# Patient Record
Sex: Female | Born: 1940 | Race: White | Hispanic: No | Smoking: Never smoker
Health system: Southern US, Community
[De-identification: ages and names within clinical notes are randomized; demographics above are authoritative.]

## PROBLEM LIST (undated history)

## (undated) DIAGNOSIS — K5190 Ulcerative colitis, unspecified, without complications: Secondary | ICD-10-CM

## (undated) DIAGNOSIS — I10 Essential (primary) hypertension: Secondary | ICD-10-CM

## (undated) DIAGNOSIS — Z2239 Carrier of other specified bacterial diseases: Secondary | ICD-10-CM

## (undated) DIAGNOSIS — K274 Chronic or unspecified peptic ulcer, site unspecified, with hemorrhage: Secondary | ICD-10-CM

## (undated) DIAGNOSIS — Z9289 Personal history of other medical treatment: Secondary | ICD-10-CM

## (undated) DIAGNOSIS — K284 Chronic or unspecified gastrojejunal ulcer with hemorrhage: Secondary | ICD-10-CM

## (undated) DIAGNOSIS — M1990 Unspecified osteoarthritis, unspecified site: Secondary | ICD-10-CM

## (undated) DIAGNOSIS — I509 Heart failure, unspecified: Secondary | ICD-10-CM

## (undated) DIAGNOSIS — K259 Gastric ulcer, unspecified as acute or chronic, without hemorrhage or perforation: Secondary | ICD-10-CM

## (undated) DIAGNOSIS — E119 Type 2 diabetes mellitus without complications: Secondary | ICD-10-CM

## (undated) DIAGNOSIS — N189 Chronic kidney disease, unspecified: Secondary | ICD-10-CM

## (undated) DIAGNOSIS — D131 Benign neoplasm of stomach: Secondary | ICD-10-CM

## (undated) HISTORY — DX: Benign neoplasm of stomach: D13.1

## (undated) HISTORY — DX: Chronic or unspecified peptic ulcer, site unspecified, with hemorrhage: K27.4

## (undated) HISTORY — PX: ABDOMINAL HYSTERECTOMY: SHX81

## (undated) HISTORY — PX: EYE SURGERY: SHX253

## (undated) HISTORY — PX: ABSCESS DRAINAGE: SHX1119

## (undated) HISTORY — DX: Chronic or unspecified gastrojejunal ulcer with hemorrhage: K28.4

## (undated) HISTORY — PX: OTHER SURGICAL HISTORY: SHX169

---

## 1968-01-03 HISTORY — PX: PERMANENT ILEOSTOMY: SHX6021

## 1992-01-03 DIAGNOSIS — K259 Gastric ulcer, unspecified as acute or chronic, without hemorrhage or perforation: Secondary | ICD-10-CM

## 1992-01-03 HISTORY — DX: Gastric ulcer, unspecified as acute or chronic, without hemorrhage or perforation: K25.9

## 1995-01-03 HISTORY — PX: BACK SURGERY: SHX140

## 1996-01-03 HISTORY — PX: BLADDER SUSPENSION: SHX72

## 2002-01-02 DIAGNOSIS — Z2239 Carrier of other specified bacterial diseases: Secondary | ICD-10-CM

## 2002-01-02 HISTORY — DX: Carrier of other specified bacterial diseases: Z22.39

## 2002-01-02 HISTORY — PX: HERNIA MESH REMOVAL: SHX1745

## 2002-01-02 HISTORY — PX: OTHER SURGICAL HISTORY: SHX169

## 2011-11-25 DIAGNOSIS — Z9889 Other specified postprocedural states: Secondary | ICD-10-CM | POA: Insufficient documentation

## 2011-11-25 DIAGNOSIS — Z8719 Personal history of other diseases of the digestive system: Secondary | ICD-10-CM | POA: Insufficient documentation

## 2011-11-25 DIAGNOSIS — Z8711 Personal history of peptic ulcer disease: Secondary | ICD-10-CM | POA: Insufficient documentation

## 2014-09-16 DIAGNOSIS — E785 Hyperlipidemia, unspecified: Secondary | ICD-10-CM | POA: Insufficient documentation

## 2014-09-16 DIAGNOSIS — E119 Type 2 diabetes mellitus without complications: Secondary | ICD-10-CM | POA: Insufficient documentation

## 2015-02-17 DIAGNOSIS — I701 Atherosclerosis of renal artery: Secondary | ICD-10-CM | POA: Insufficient documentation

## 2015-03-22 DIAGNOSIS — E782 Mixed hyperlipidemia: Secondary | ICD-10-CM | POA: Insufficient documentation

## 2015-03-22 DIAGNOSIS — I129 Hypertensive chronic kidney disease with stage 1 through stage 4 chronic kidney disease, or unspecified chronic kidney disease: Secondary | ICD-10-CM | POA: Insufficient documentation

## 2015-03-22 DIAGNOSIS — N183 Chronic kidney disease, stage 3 unspecified: Secondary | ICD-10-CM | POA: Insufficient documentation

## 2015-04-26 DIAGNOSIS — N261 Atrophy of kidney (terminal): Secondary | ICD-10-CM | POA: Insufficient documentation

## 2015-06-29 DIAGNOSIS — E1122 Type 2 diabetes mellitus with diabetic chronic kidney disease: Secondary | ICD-10-CM | POA: Insufficient documentation

## 2015-07-08 DIAGNOSIS — N183 Chronic kidney disease, stage 3 unspecified: Secondary | ICD-10-CM | POA: Insufficient documentation

## 2015-08-31 DIAGNOSIS — E876 Hypokalemia: Secondary | ICD-10-CM | POA: Insufficient documentation

## 2016-03-01 DIAGNOSIS — F329 Major depressive disorder, single episode, unspecified: Secondary | ICD-10-CM | POA: Insufficient documentation

## 2016-03-01 DIAGNOSIS — F32A Depression, unspecified: Secondary | ICD-10-CM | POA: Insufficient documentation

## 2016-05-25 DIAGNOSIS — H04123 Dry eye syndrome of bilateral lacrimal glands: Secondary | ICD-10-CM | POA: Insufficient documentation

## 2016-05-25 DIAGNOSIS — H43813 Vitreous degeneration, bilateral: Secondary | ICD-10-CM | POA: Insufficient documentation

## 2016-05-25 DIAGNOSIS — H524 Presbyopia: Secondary | ICD-10-CM

## 2016-05-25 DIAGNOSIS — H53022 Refractive amblyopia, left eye: Secondary | ICD-10-CM | POA: Insufficient documentation

## 2016-05-25 DIAGNOSIS — H5213 Myopia, bilateral: Secondary | ICD-10-CM | POA: Insufficient documentation

## 2016-05-25 DIAGNOSIS — H52203 Unspecified astigmatism, bilateral: Secondary | ICD-10-CM

## 2016-05-25 DIAGNOSIS — E119 Type 2 diabetes mellitus without complications: Secondary | ICD-10-CM | POA: Insufficient documentation

## 2017-03-01 ENCOUNTER — Encounter (INDEPENDENT_AMBULATORY_CARE_PROVIDER_SITE_OTHER): Payer: Self-pay | Admitting: Specialist

## 2017-03-01 ENCOUNTER — Ambulatory Visit (INDEPENDENT_AMBULATORY_CARE_PROVIDER_SITE_OTHER): Payer: Medicare Other

## 2017-03-01 ENCOUNTER — Ambulatory Visit (INDEPENDENT_AMBULATORY_CARE_PROVIDER_SITE_OTHER): Payer: Medicare Other | Admitting: Specialist

## 2017-03-01 VITALS — BP 148/74 | HR 74 | Ht 61.0 in | Wt 140.0 lb

## 2017-03-01 DIAGNOSIS — M25561 Pain in right knee: Secondary | ICD-10-CM

## 2017-03-01 DIAGNOSIS — M170 Bilateral primary osteoarthritis of knee: Secondary | ICD-10-CM | POA: Diagnosis not present

## 2017-03-01 DIAGNOSIS — M25562 Pain in left knee: Secondary | ICD-10-CM

## 2017-03-01 DIAGNOSIS — G8929 Other chronic pain: Secondary | ICD-10-CM | POA: Diagnosis not present

## 2017-03-01 DIAGNOSIS — E119 Type 2 diabetes mellitus without complications: Secondary | ICD-10-CM

## 2017-03-01 MED ORDER — HYDROCODONE-ACETAMINOPHEN 5-325 MG PO TABS: 1.0000 | ORAL_TABLET | Freq: Four times a day (QID) | ORAL | 0 refills | Status: DC | PRN

## 2017-03-01 NOTE — Progress Notes (Signed)
Office Visit Note   Patient: Angelica French           Date of Birth: 04-17-1940           MRN: [id number] Visit Date: 03/01/2017              Requested by: Lauraine Rinne, MD [place], [place] [address] PCP: Thornton Dales I, MD   Assessment & Plan: Visit Diagnoses:  1. Bilateral chronic knee pain   2. Bilateral primary osteoarthritis of knee   3. 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 She has been treated for years with exercise, intra articular injections and use of NSAIDs. Now with GI bleed she is unable to take NSAIDs and intraarticular injections are no longer of benefit. I am prescribing hydrocodone for pain and plan to schedule her for a left total knee replacement to be followed in 6 weeks with a right total knee replacement. Risks of surgery discussed with her and she understands the need to control the diabetes. I expect an excellent outcome  With arthroplasty procedure as she has Done everything short of surgery to treat her arthritis and now is no longer benefiting from conservative treatment.    Plan: Avoid prolong standing and walking. Exercise with the knee as tolerated,stationary bike or pool walking . Use ice to the left knee  30 min on and 15 min off as needed. Take meloxicam for antiinflamatory affect. Kandice Hams, surgery scheduling secretary will call you to arrange for Surgery. Surgery recommended is a left total knee replacement. Risks of surgery include risk of infection 1 in 100-200. Risk of bleeding  Less than 1 5 chance of needing a blood transfusion. Take hydrocodone for pain.  Follow-Up Instructions: Return in about 6 weeks (around 04/12/2017).   Orders:  Orders Placed This Encounter  Procedures  . XR Knee 1-2 Views Right  . XR Knee 1-2 Views Left   No orders of the defined  types were placed in this encounter.     Procedures: No procedures performed   Clinical Data: No additional findings.   Subjective: Chief Complaint  Patient presents with  . Right Knee - Pain  . Left Knee - Pain    77 year old female with increasing bilateral knee pain and stiffness. Pain is present with firsta standing and walking.  She has a history of bilateral knee osteoarthritis, injections with synvisc without help. The cortisone shots in the past have been of help, last injection by a sports medicine MD in [place] was not helpful. No tusing a can, she tried her husband's and her mother's without help.  She has a history of a bleeding ulcer related to NSAIDs use.     Review of Systems  Constitutional: Negative.   HENT: Negative.   Eyes: Negative.   Respiratory: Negative.   Cardiovascular: Negative.   Gastrointestinal: Negative.   Endocrine: Negative.   Genitourinary: Negative.   Musculoskeletal: Negative.   Skin: Negative.   Allergic/Immunologic: Negative.   Neurological: Negative.   Hematological: Negative.   Psychiatric/Behavioral: Negative.      Objective: Vital Signs: BP (!) 148/74   Pulse 74   Ht 5\' 1"  (1.549 m)   Wt 140 lb (63.5 kg)   BMI 26.45 kg/m   Physical Exam  Constitutional: She is oriented to person, place, and time. She appears  well-developed and well-nourished.  HENT:  Head: Normocephalic and atraumatic.  Eyes: EOM are normal. Pupils are equal, round, and reactive to light.  Neck: Normal range of motion. Neck supple.  Pulmonary/Chest: Effort normal and breath sounds normal.  Abdominal: Soft. Bowel sounds are normal.  Neurological: She is alert and oriented to person, place, and time.  Skin: Skin is warm and dry.  Psychiatric: She has a normal mood and affect. Her behavior is normal. Judgment and thought content normal.    Right Knee Exam   Muscle Strength  The patient has normal right knee strength.  Range of Motion  Extension:  -5  abnormal  Flexion:  130 normal   Tests  McMurray:  Medial - negative Lateral - negative Varus: positive  Lachman:  Anterior - negative    Posterior - negative Drawer:  Anterior - negative    Posterior - negative Pivot shift: positive Patellar apprehension: positive   Left Knee Exam   Muscle Strength  The patient has normal left knee strength.  Range of Motion  Extension: 10  Flexion:  120 normal   Tests  Varus: positive  Lachman:  Anterior - negative    Posterior - negative Drawer:  Anterior - negative     Posterior - negative Pivot shift: positive Patellar apprehension: positive      Specialty Comments:  No specialty comments available.  Imaging: No results found.   PMFS History: There are no active problems to display for this patient.  Past Medical History:  Diagnosis Date  . Bleeding ulcer     History reviewed. No pertinent family history.  History reviewed. No pertinent surgical history. Social History   Occupational History  . Not on file  Tobacco Use  . Smoking status: Never Smoker  . Smokeless tobacco: Never Used  Substance and Sexual Activity  . Alcohol use: Not on file  . Drug use: Not on file  . Sexual activity: Not on file

## 2017-03-01 NOTE — Patient Instructions (Signed)
Avoid prolong standing and walking. Exercise with the knee as tolerated,stationary bike or pool walking . Use ice to the left knee  30 min on and 15 min off as needed. Take meloxicam for antiinflamatory affect. Kandice Hams, surgery scheduling secretary will call you to arrange for Surgery. Surgery recommended is a left total knee replacement. Risks of surgery include risk of infection 1 in 100-200. Risk of bleeding  Less than 1 5 chance of needing a blood transfusion. Take hydrocodone for pain.

## 2017-04-04 NOTE — Pre-Procedure Instructions (Signed)
Angelica French  04/04/2017      [facility] - [place], [place] - [name] [place] [name] [place] [address] Phone: [phone] Fax: [phone]    Your procedure is scheduled on Friday April 12.  Report to [facility] Admitting at 10:45 A.M.  Call this number if you have problems the morning of surgery:  [phone]   Remember:  Do not eat food or drink liquids after midnight.  Take these medicines the morning of surgery with A SIP OF WATER:   Carvedilol (coreg) Hydralazine (apresoline) Hydrocodone-acetaminophen (Norco) if needed  DO NOT TAKE Victoza (liraglutide) the day of surgery  DO NOT TAKE Glimepiride (Amaryl) the day of surgery or the night before surgery  7 days prior to surgery STOP taking any Aspirin(unless otherwise instructed by your surgeon), Aleve, Naproxen, Ibuprofen, Motrin, Advil, Goody's, BC's, all herbal medications, fish oil, and all vitamins     How to Manage Your Diabetes Before and After Surgery  Why is it important to control my blood sugar before and after surgery? . Improving blood sugar levels before and after surgery helps healing and can limit problems. . A way of improving blood sugar control is eating a healthy diet by: o  Eating less sugar and carbohydrates o  Increasing activity/exercise o  Talking with your doctor about reaching your blood sugar goals . High blood sugars (greater than 180 mg/dL) can raise your risk of infections and slow your recovery, so you will need to focus on controlling your diabetes during the weeks before surgery. . Make sure that the doctor who takes care of your diabetes knows about your planned surgery including the date and location.  How do I manage my blood sugar before surgery? . Check your blood sugar at least 4 times a day, starting 2 days before surgery, to make sure that the level is not too high or low. o Check your blood  sugar the morning of your surgery when you wake up and every 2 hours until you get to the Short Stay unit. . If your blood sugar is less than 70 mg/dL, you will need to treat for low blood sugar: o Do not take insulin. o Treat a low blood sugar (less than 70 mg/dL) with  cup of clear juice (cranberry or apple), 4 glucose tablets, OR glucose gel. Recheck blood sugar in 15 minutes after treatment (to make sure it is greater than 70 mg/dL). If your blood sugar is not greater than 70 mg/dL on recheck, call [phone] o  for further instructions. . Report your blood sugar to the short stay nurse when you get to Short Stay.  . If you are admitted to the hospital after surgery: o Your blood sugar will be checked by the staff and you will probably be given insulin after surgery (instead of oral diabetes medicines) to make sure you have good blood sugar levels. o The goal for blood sugar control after surgery is 80-180 mg/dL.              Do not wear jewelry, make-up or nail polish.  Do not wear lotions, powders, or perfumes, or deodorant.  Do not shave 48 hours prior to surgery.  Men may shave face and neck.  Do not bring valuables to the hospital.  [facility] is not responsible for any belongings or valuables.  Contacts, dentures or bridgework may not be  worn into surgery.  Leave your suitcase in the car.  After surgery it may be brought to your room.  For patients admitted to the hospital, discharge time will be determined by your treatment team.  Patients discharged the day of surgery will not be allowed to drive home.   Special instructions:    - Preparing For Surgery  Before surgery, you can play an important role. Because skin is not sterile, your skin needs to be as free of germs as possible. You can reduce the number of germs on your skin by washing with CHG (chlorahexidine gluconate) Soap before surgery.  CHG is an antiseptic cleaner which kills germs and bonds  with the skin to continue killing germs even after washing.  Please do not use if you have an allergy to CHG or antibacterial soaps. If your skin becomes reddened/irritated stop using the CHG.  Do not shave (including legs and underarms) for at least 48 hours prior to first CHG shower. It is OK to shave your face.  Please follow these instructions carefully.   1. Shower the NIGHT BEFORE SURGERY and the MORNING OF SURGERY with CHG.   2. If you chose to wash your hair, wash your hair first as usual with your normal shampoo.  3. After you shampoo, rinse your hair and body thoroughly to remove the shampoo.  4. Use CHG as you would any other liquid soap. You can apply CHG directly to the skin and wash gently with a scrungie or a clean washcloth.   5. Apply the CHG Soap to your body ONLY FROM THE NECK DOWN.  Do not use on open wounds or open sores. Avoid contact with your eyes, ears, mouth and genitals (private parts). Wash Face and genitals (private parts)  with your normal soap.  6. Wash thoroughly, paying special attention to the area where your surgery will be performed.  7. Thoroughly rinse your body with warm water from the neck down.  8. DO NOT shower/wash with your normal soap after using and rinsing off the CHG Soap.  9. Pat yourself dry with a CLEAN TOWEL.  10. Wear CLEAN PAJAMAS to bed the night before surgery, wear comfortable clothes the morning of surgery  11. Place CLEAN SHEETS on your bed the night of your first shower and DO NOT SLEEP WITH PETS.    Day of Surgery: Do not apply any deodorants/lotions. Please wear clean clothes to the hospital/surgery center.      Please read over the following fact sheets that you were given. Coughing and Deep Breathing, Total Joint Packet, MRSA Information and Surgical Site Infection Prevention

## 2017-04-05 ENCOUNTER — Ambulatory Visit (INDEPENDENT_AMBULATORY_CARE_PROVIDER_SITE_OTHER): Payer: Medicare Other | Admitting: Surgery

## 2017-04-05 ENCOUNTER — Encounter (INDEPENDENT_AMBULATORY_CARE_PROVIDER_SITE_OTHER): Payer: Self-pay | Admitting: Surgery

## 2017-04-05 ENCOUNTER — Encounter (HOSPITAL_COMMUNITY): Payer: Self-pay

## 2017-04-05 ENCOUNTER — Other Ambulatory Visit (INDEPENDENT_AMBULATORY_CARE_PROVIDER_SITE_OTHER): Payer: Self-pay

## 2017-04-05 ENCOUNTER — Ambulatory Visit (HOSPITAL_COMMUNITY)
Admission: RE | Admit: 2017-04-05 | Discharge: 2017-04-05 | Disposition: A | Discharge status: Home / Self Care | Payer: Medicare Other | Source: Ambulatory Visit | Attending: Surgery | Admitting: Surgery

## 2017-04-05 ENCOUNTER — Encounter (HOSPITAL_COMMUNITY)
Admission: RE | Admit: 2017-04-05 | Discharge: 2017-04-05 | Disposition: A | Discharge status: Home / Self Care | Payer: Medicare Other | Source: Ambulatory Visit | Attending: Specialist | Admitting: Specialist

## 2017-04-05 ENCOUNTER — Other Ambulatory Visit: Payer: Self-pay

## 2017-04-05 VITALS — BP 130/70 | HR 73 | Ht 61.0 in | Wt 136.0 lb

## 2017-04-05 DIAGNOSIS — M1712 Unilateral primary osteoarthritis, left knee: Secondary | ICD-10-CM | POA: Diagnosis not present

## 2017-04-05 DIAGNOSIS — I130 Hypertensive heart and chronic kidney disease with heart failure and stage 1 through stage 4 chronic kidney disease, or unspecified chronic kidney disease: Secondary | ICD-10-CM | POA: Diagnosis not present

## 2017-04-05 DIAGNOSIS — Z01818 Encounter for other preprocedural examination: Secondary | ICD-10-CM | POA: Insufficient documentation

## 2017-04-05 DIAGNOSIS — M1711 Unilateral primary osteoarthritis, right knee: Secondary | ICD-10-CM | POA: Insufficient documentation

## 2017-04-05 DIAGNOSIS — E1122 Type 2 diabetes mellitus with diabetic chronic kidney disease: Secondary | ICD-10-CM | POA: Diagnosis not present

## 2017-04-05 DIAGNOSIS — N189 Chronic kidney disease, unspecified: Secondary | ICD-10-CM | POA: Diagnosis not present

## 2017-04-05 DIAGNOSIS — I509 Heart failure, unspecified: Secondary | ICD-10-CM | POA: Diagnosis not present

## 2017-04-05 DIAGNOSIS — Z79899 Other long term (current) drug therapy: Secondary | ICD-10-CM | POA: Insufficient documentation

## 2017-04-05 HISTORY — DX: Ulcerative colitis, unspecified, without complications: K51.90

## 2017-04-05 HISTORY — DX: Personal history of other medical treatment: Z92.89

## 2017-04-05 HISTORY — DX: Gastric ulcer, unspecified as acute or chronic, without hemorrhage or perforation: K25.9

## 2017-04-05 HISTORY — DX: Essential (primary) hypertension: I10

## 2017-04-05 HISTORY — DX: Type 2 diabetes mellitus without complications: E11.9

## 2017-04-05 HISTORY — DX: Chronic kidney disease, unspecified: N18.9

## 2017-04-05 HISTORY — DX: Carrier of other specified bacterial diseases: Z22.39

## 2017-04-05 HISTORY — DX: Unspecified osteoarthritis, unspecified site: M19.90

## 2017-04-05 HISTORY — DX: Heart failure, unspecified: I50.9

## 2017-04-05 LAB — COMPREHENSIVE METABOLIC PANEL
ALT: 15 U/L (ref 14–54)
AST: 23 U/L (ref 15–41)
Albumin: 3.9 g/dL (ref 3.5–5.0)
Alkaline Phosphatase: 68 U/L (ref 38–126)
Anion gap: 14 (ref 5–15)
BUN: 23 mg/dL — ABNORMAL HIGH (ref 6–20)
CHLORIDE: 105 mmol/L (ref 101–111)
CO2: 18 mmol/L — AB (ref 22–32)
Calcium: 9.6 mg/dL (ref 8.9–10.3)
Creatinine, Ser: 1.55 mg/dL — ABNORMAL HIGH (ref 0.44–1.00)
GFR, EST AFRICAN AMERICAN: 36 mL/min — AB (ref >60)
GFR, EST NON AFRICAN AMERICAN: 31 mL/min — AB (ref >60)
Glucose, Bld: 165 mg/dL — ABNORMAL HIGH (ref 65–99)
POTASSIUM: 4.2 mmol/L (ref 3.5–5.1)
SODIUM: 137 mmol/L (ref 135–145)
Total Bilirubin: 0.9 mg/dL (ref 0.3–1.2)
Total Protein: 7.6 g/dL (ref 6.5–8.1)

## 2017-04-05 LAB — CBC
HCT: 39.7 % (ref 36.0–46.0)
Hemoglobin: 13.1 g/dL (ref 12.0–15.0)
MCH: 26.7 pg (ref 26.0–34.0)
MCHC: 33 g/dL (ref 30.0–36.0)
MCV: 80.9 fL (ref 78.0–100.0)
PLATELETS: 185 10*3/uL (ref 150–400)
RBC: 4.91 MIL/uL (ref 3.87–5.11)
RDW: 15.5 % (ref 11.5–15.5)
WBC: 10.2 10*3/uL (ref 4.0–10.5)

## 2017-04-05 LAB — HEMOGLOBIN A1C
Hgb A1c MFr Bld: 7.3 % — ABNORMAL HIGH (ref 4.8–5.6)
MEAN PLASMA GLUCOSE: 162.81 mg/dL

## 2017-04-05 LAB — PROTIME-INR
INR: 1.05
PROTHROMBIN TIME: 13.6 s (ref 11.4–15.2)

## 2017-04-05 LAB — SURGICAL PCR SCREEN
MRSA, PCR: NEGATIVE
STAPHYLOCOCCUS AUREUS: NEGATIVE

## 2017-04-05 LAB — APTT: aPTT: 39 seconds — ABNORMAL HIGH (ref 24–36)

## 2017-04-05 LAB — GLUCOSE, CAPILLARY: Glucose-Capillary: 233 mg/dL — ABNORMAL HIGH (ref 65–99)

## 2017-04-05 NOTE — H&P (Addendum)
TOTAL KNEE ADMISSION H&P  Patient is being admitted for left total knee arthroplasty.  And right knee intra-articular Marcaine/Depo-Medrol injection  Subjective:  Chief Complaint:left knee pain and right knee pain  HPI: Angelica French, 77 y.o. female, has a history of pain and functional disability in the left knee due to arthritis and has failed non-surgical conservative treatments for greater than 12 weeks to includeNSAID's and/or analgesics, corticosteriod injections, use of assistive devices, weight reduction as appropriate and activity modification.  Onset of symptoms was gradual, starting 10 years ago with gradually worsening course since that time.  Patient currently rates pain in the left knee(s) at 10 out of 10 with activity. Patient has night pain, worsening of pain with activity and weight bearing, pain that interferes with activities of daily living, pain with passive range of motion, crepitus and joint swelling.  Patient has evidence of subchondral sclerosis, periarticular osteophytes and joint space narrowing by imaging studies.  There is no active infection.  There are no active problems to display for this patient.  Past Medical History:  Diagnosis Date  . Arthritis   . Bleeding ulcer   . CHF (congestive heart failure) ([place])   . Chronic kidney disease    Renal artery stenosis - right. Left kindey ok- sees Dr Neta Ehlers- nepjrologist  . Diabetes mellitus without complication ([place])    type II  . Gastric ulcer 1994   bleeding  . History of blood transfusion    1970- illeostomy 2004-  . Hypertension   . UC (ulcerative colitis) [facility])     Past Surgical History:  Procedure Laterality Date  . ABDOMINAL HYSTERECTOMY      No current facility-administered medications for this encounter.    Current Outpatient Medications  Medication Sig Dispense Refill Last Dose  . atorvastatin (LIPITOR) 20 MG tablet Take 20 mg by mouth. 3 times a week at bedtime   Taking  . Biotin 2500 MCG CAPS  Take 2,500 mcg by mouth daily.    Taking  . carvedilol (COREG) 25 MG tablet Take 25 mg by mouth Nightly.   Taking  . cholecalciferol (VITAMIN D) 1000 units tablet Take 1,000 Units by mouth every other day.    Taking  . clindamycin (CLEOCIN) 2 % vaginal cream Place 1 Applicatorful vaginally daily as needed (irritation).    Taking  . Desoximetasone 0.05 % GEL Apply 1 application topically daily as needed (irritation).   Taking  . furosemide (LASIX) 40 MG tablet Take 40 mg by mouth once daily as needed for swelling   Taking  . glimepiride (AMARYL) 4 MG tablet Take 4 mg by mouth twice daily   Taking  . hydrALAZINE (APRESOLINE) 50 MG tablet Take 50 mg by mouth twice daily   Taking  . hydrochlorothiazide (HYDRODIURIL) 25 MG tablet Take 25 mg by mouth daily.  1 Taking  . HYDROcodone-acetaminophen (NORCO/VICODIN) 5-325 MG tablet Take 1 tablet by mouth every 6 (six) hours as needed for moderate pain. 40 tablet 0 Taking  . liraglutide (VICTOZA) 18 MG/3ML SOPN Inject 0.6 mg once daily   Taking  . losartan (COZAAR) 50 MG tablet Take 50 mg by mouth at bedtime.    Taking  . naproxen sodium (ALEVE) 220 MG tablet Take 220 mg by mouth daily as needed (pain).   Taking  . potassium bicarbonate (K-LYTE) 25 MEQ disintegrating tablet Take 25 mEq by mouth daily as needed (swelling). With lasix   Taking   Allergies  Allergen Reactions  . Adhesive [Tape] Hives and  Itching  . Nsaids     Gi pain after prolonged use     Social History   Tobacco Use  . Smoking status: Never Smoker  . Smokeless tobacco: Never Used  Substance Use Topics  . Alcohol use: Not on file    Comment: occasional drink with liqueor    Family History  Problem Relation Age of Onset  . Heart disease Father      Review of Systems  Constitutional: Negative.   HENT: Negative.   Respiratory: Negative.   Gastrointestinal: Negative.   Musculoskeletal: Positive for joint pain.  Skin: Negative.   Neurological: Negative.    Psychiatric/Behavioral: Negative.     Objective:  Physical Exam  Constitutional: She is oriented to person, place, and time. She appears well-developed.  HENT:  Head: Normocephalic and atraumatic.  Eyes: Pupils are equal, round, and reactive to light. EOM are normal.  Neck: Normal range of motion.  Cardiovascular: Regular rhythm.  Respiratory: Effort normal. No respiratory distress. She has no wheezes.  GI: She exhibits no distension.  Musculoskeletal:  Patient has left greater than right knee joint line tenderness.  Positive crepitus.  Ligaments are stable.  Bilateral knee Effusions.  Bilateral calves are nontender  Neurological: She is alert and oriented to person, place, and time.  Skin: Skin is warm and dry.  Psychiatric: She has a normal mood and affect.    Vital signs in last 24 hours: Temp:  [97.7 F (36.5 C)] 97.7 F (36.5 C) (04/04 1125) Pulse Rate:  [71-73] 73 (04/04 1358) Resp:  [20] 20 (04/04 1125) BP: (94-130)/(59-70) 130/70 (04/04 1358) SpO2:  [99 %] 99 % (04/04 1125) Weight:  [136 lb (61.7 kg)-136 lb 6.4 oz (61.9 kg)] 136 lb (61.7 kg) (04/04 1358)  Labs:   Estimated body mass index is 25.7 kg/m as calculated from the following:   Height as of 04/05/17: 5\' 1"  (1.549 m).   Weight as of 04/05/17: 136 lb (61.7 kg).   Imaging Review Plain radiographs demonstrate moderate degenerative joint disease of the left knee(s). The overall alignment ismild varus. The bone quality appears to be good for age and reported activity level.   Preoperative templating of the joint replacement has been completed, documented, and submitted to the Operating Room personnel in order to optimize intra-operative equipment management.      Assessment/Plan:  End stage arthritis, left knee  Right knee arthritis  We will proceed with left total knee replacement as scheduled along with right knee intra-articular Marcaine/Depo-Medrol injection.  The patient history, physical  examination, clinical judgment of the provider and imaging studies are consistent with end stage degenerative joint disease of the left knee(s) and total knee arthroplasty is deemed medically necessary. The treatment options including medical management, injection therapy arthroscopy and arthroplasty were discussed at length. The risks and benefits of total knee arthroplasty were presented and reviewed. The risks due to aseptic loosening, infection, stiffness, patella tracking problems, thromboembolic complications and other imponderables were discussed. The patient acknowledged the explanation, agreed to proceed with the plan and consent was signed. Patient is being admitted for inpatient treatment for surgery, pain control, PT, OT, prophylactic antibiotics, VTE prophylaxis, progressive ambulation and ADL's and discharge planning. The patient is planning to be discharged to skilled nursing facility

## 2017-04-05 NOTE — Pre-Procedure Instructions (Signed)
Angelica French  04/05/2017      Your procedure is scheduled on Friday April 12.  Report to [facility] Admitting at 10:45 A.M.               Your surgery or procedure is scheduled for 12:45 PM   Call this number if you have problems the morning of surgery:[phone] -pre- op desk                For any other questions, please call [phone], Monday - Friday 8 AM - 4 PM.- PAT desk, ask for any nurse    Remember:  Do not eat food or drink liquids after midnight.  Take these medicines the morning of surgery with A SIP OF WATER:   Carvedilol (coreg) Hydralazine (apresoline) Hydrocodone-acetaminophen (Norco) if needed  DO NOT TAKE Victoza (liraglutide) the day of surgery  DO NOT TAKE Glimepiride (Amaryl) the day of surgery or the night before surgery  7 days prior to surgery STOP taking any Aspirin(unless otherwise instructed by your surgeon), Aleve, Naproxen, Ibuprofen, Motrin, Advil, Goody's, BC's, all herbal medications, fish oil, and all vitamins     How to Manage Your Diabetes Before and After Surgery  Why is it important to control my blood sugar before and after surgery? . Improving blood sugar levels before and after surgery helps healing and can limit problems. . A way of improving blood sugar control is eating a healthy diet by: o  Eating less sugar and carbohydrates o  Increasing activity/exercise o  Talking with your doctor about reaching your blood sugar goals . High blood sugars (greater than 180 mg/dL) can raise your risk of infections and slow your recovery, so you will need to focus on controlling your diabetes during the weeks before surgery. . Make sure that the doctor who takes care of your diabetes knows about your planned surgery including the date and location.  How do I manage my blood sugar before surgery? . Check your blood sugar at least 4 times a day, starting 2 days before surgery, to make sure that the level is not too high or  low. o Check your blood sugar the morning of your surgery when you wake up and every 2 hours until you get to the Short Stay unit. . If your blood sugar is less than 70 mg/dL, you will need to treat for low blood sugar: o Do not take insulin. o Treat a low blood sugar (less than 70 mg/dL) with  cup of clear juice (cranberry or apple), 4 glucose tablets, OR glucose gel. Recheck blood sugar in 15 minutes after treatment (to make sure it is greater than 70 mg/dL). If your blood sugar is not greater than 70 mg/dL on recheck, call [phone] o  for further instructions. . Report your blood sugar to the short stay nurse when you get to Short Stay.  . If you are admitted to the hospital after surgery: o Your blood sugar will be checked by the staff and you will probably be given insulin after surgery (instead of oral diabetes medicines) to make sure you have good blood sugar levels. o The goal for blood sugar control after surgery is 80-180 mg/dL.              Do not wear jewelry, make-up or nail polish.  Do not wear lotions, powders, or perfumes, or deodorant.  Do not shave 48 hours prior to surgery.  Men may shave face and  neck.  Do not bring valuables to the hospital.  [facility] is not responsible for any belongings or valuables.  Contacts, dentures or bridgework may not be worn into surgery.  Leave your suitcase in the car.  After surgery it may be brought to your room.  For patients admitted to the hospital, discharge time will be determined by your treatment team.  Patients discharged the day of surgery will not be allowed to drive home.   Special instructions:    [place]- Preparing For Surgery  Before surgery, you can play an important role. Because skin is not sterile, your skin needs to be as free of germs as possible. You can reduce the number of germs on your skin by washing with CHG (chlorahexidine gluconate) Soap before surgery.  CHG is an antiseptic cleaner  which kills germs and bonds with the skin to continue killing germs even after washing.  Please do not use if you have an allergy to CHG or antibacterial soaps. If your skin becomes reddened/irritated stop using the CHG.  Do not shave (including legs and underarms) for at least 48 hours prior to first CHG shower. It is OK to shave your face.  Please follow these instructions carefully.   1. Shower the NIGHT BEFORE SURGERY and the MORNING OF SURGERY with CHG.   2. If you chose to wash your hair, wash your hair first as usual with your normal shampoo.  3. After you shampoo, rinse your hair and body thoroughly to remove the shampoo. 4. Wash Face and genitals (private parts)  with your normal soap, then rinse  5. Use CHG as you would any other liquid soap. You can apply CHG directly to the skin and wash gently with a scrungie or a clean washcloth.   6. Apply the CHG Soap to your body ONLY FROM THE NECK DOWN.  Do not use on open wounds or open sores. Avoid contact with your eyes, ears, mouth and genitals (private parts).   7. Wash thoroughly, paying special attention to the area where your surgery will be performed.  8. Thoroughly rinse your body with warm water from the neck down.  9. DO NOT shower/wash with your normal soap after using and rinsing off the CHG Soap.  10. Pat yourself dry with a CLEAN TOWEL.  11. Wear CLEAN PAJAMAS to bed the night before surgery, wear comfortable clothes the morning of surgery  12. Place CLEAN SHEETS on your bed the night of your first shower and DO NOT SLEEP WITH PETS.  Day of Surgery:  Shower as above Do not apply any deodorants/lotions. Please wear clean clothes to the hospital/surgery center.    Do not wear jewelry, make-up or nail polish.  Do not wear lotions, powders, or perfumes, or deodorant.  Do not shave 48 hours prior to surgery.  Men may shave face and neck.  Do not bring valuables to the hospital.  [facility] is not responsible for any  belongings or valuables.  Contacts, dentures or bridgework may not be worn into surgery.  Leave your suitcase in the car.  After surgery it may be brought to your room.  For patients admitted to the hospital, discharge time will be determined by your treatment team.  Patients discharged the day of surgery will not be allowed to drive home.    Please read over the following fact sheets that you were given. Coughing and Deep Breathing, Total Joint Packet, MRSA Information and Surgical Site Infection Prevention

## 2017-04-05 NOTE — Progress Notes (Signed)
77 year old white female history of end-stage DJD left knee presents for preop evaluation for left total knee replacement and right knee intra-articular Marcaine/Depo-Medrol injection.  We received preoperative cardiac and medical clearance is.  Full H&P placed in patient's chart.  Patient's husband is not in the best of health and we did discuss the likelihood of needing short-term skilled nursing facility placement postop.  She does live out in [facility] and we discussed [facility] facility.

## 2017-04-06 NOTE — Progress Notes (Addendum)
Anesthesia chart review: Patient is a 77 year old female scheduled for left total knee arthroplasty and right knee intra-articular steroid injection on 04/13/2017 by Dr. [name]. Case is posted for general anesthesia.   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   - PCP is Dr. Thornton Dales. She signed a note of medical clearance on 03/28/17 with recommendation to repeat urine culture following antibiotic (Cipro ?) completion.  - [facility] Last visit 03/21/17. He cleared patient after recent low risk Lexiscan stress test.  - [facility] Last visit 04/06/17. He prescribed azithromycin for persistent URI symptoms. She apparently could not provide a urine specimen while at PAT and reportedly has some residual dysuria, so he ordered a UA and urine culture. Notes indicate that she does have some insulin at home that she takes for a few days after any steroid injections.  - [facility] Last visit 01/16/17.  Meds include Lipitor, Coreg, Lasix, glimepiride, hydralazine, HCTZ, Norco, Victoza, losartan, K-lyte. Reportedly, patient just finished or is going to finish Cipro within a few days of her PAT visit. She uses [employer] ileostomy flange [phone]) and pouch [phone]).  BP (!) 94/59   Pulse 71   Temp 36.5 C   Resp 20   Ht '5\' 1"'  (1.549 m)   Wt 136 lb 6.4 oz (61.9 kg)   SpO2 99%   BMI 25.77 kg/m  BP 130/70 on 04/05/17.   EKG 03/21/17 (Dr. Donnetta Hutching): Sinus rhythm, frequent PVCs (primarily in a trigeminy pattern).  Nonspecific ST/T wave changes in lateral leads.  Nuclear stress test  03/27/17: Summary There is normal isotope uptake following Lexiscan injection and at rest. There is no evidence of ischemia. Normal LV function with EF of 68 %.  Echo 06/15/14: Report not viewable in [place]. According to Dr. Deatra Robinson 09/16/14 note, "I have reviewed the echo and she has normal LV function with no significant valve disease."  Carotid U/S 05/31/15: Report requested as it is not viewable in [place].  Renal U/S 02/01/17 ([place]): IMPRESSION:  1. Presumably chronic atrophy of the right kidney with nondiagnostic  evaluation of the arterial flow to the right kidney.  2. Normal sonographic appearance of the left kidney without evidence  of renal artery stenosis.    Preoperative labs noted. BUN 23, Cr 1.55. (Previous BUN/Cr 25/1.22 03/20/17, 24/1.06 01/09/17, 28/1.14 10/06/16 in [place].)  A1c 7.3. CBC WNL. PT/INR WNL. PTT 39.   Will leave chart for follow-up echo, carotid reports and urine culture results. Will plan to forward labs to her nephrologist since Cr up since her 01/2017 visit. eGFR remains in the CKD stage III range (3B).   George Hugh [facility] Short Stay Center/Anesthesiology Phone [phone] 04/06/2017 6:33 PM  Addendum: 04/06/17 urine culture showed "no growth" (see [place]). I notified Apolonio Schneiders at Dr. Dyann Kief office of BUN, Cr, eGFR from 04/05/17 and that patient was just finishing up antibiotics at that time. He is in the office this week. She will ask him or his staff to call if any new recommendations prior to surgery. If no new recommendations, I will plan to repeat STAT BMET prior to surgery to re-evaluate.  Still awaking echo and carotid reports. (Update 04/12/17 5:35 PM: Despite multiple phone call and at least three fax requests for echo and carotid Duplex results, records were still not sent. Dr. Donnetta Hutching at least summarized the echo results in his note. I do not see carotid results summarized in review of  multiple notes, but there is also no indication of carotid artery stenosis history in PCP and cardiology records, and they would have had access to results. There is no history of CVA. Based on currently available information, I would anticipate that she can proceed as planned. Her PCP and cardiologist have cleared for surgery.)   George Hugh [facility] Short Stay Center/Anesthesiology Phone [phone] 04/09/2017 9:50 AM

## 2017-04-12 ENCOUNTER — Ambulatory Visit (INDEPENDENT_AMBULATORY_CARE_PROVIDER_SITE_OTHER): Payer: Medicare Other | Admitting: Specialist

## 2017-04-13 ENCOUNTER — Ambulatory Visit (HOSPITAL_COMMUNITY): Payer: Medicare Other | Admitting: Anesthesiology

## 2017-04-13 ENCOUNTER — Encounter (HOSPITAL_COMMUNITY)
Admission: RE | Disposition: A | Discharge status: Skilled Nursing Facility | Payer: Self-pay | Source: Ambulatory Visit | Attending: Specialist

## 2017-04-13 ENCOUNTER — Ambulatory Visit (HOSPITAL_COMMUNITY): Payer: Medicare Other | Admitting: Vascular Surgery

## 2017-04-13 ENCOUNTER — Other Ambulatory Visit: Payer: Self-pay

## 2017-04-13 ENCOUNTER — Inpatient Hospital Stay (HOSPITAL_COMMUNITY)
Admission: RE | Admit: 2017-04-13 | Discharge: 2017-04-17 | DRG: 470 | Disposition: A | Discharge status: Skilled Nursing Facility | Payer: Medicare Other | Source: Ambulatory Visit | Attending: Specialist | Admitting: Specialist

## 2017-04-13 ENCOUNTER — Encounter (HOSPITAL_COMMUNITY): Payer: Self-pay | Admitting: Surgery

## 2017-04-13 DIAGNOSIS — Z90710 Acquired absence of both cervix and uterus: Secondary | ICD-10-CM

## 2017-04-13 DIAGNOSIS — N183 Chronic kidney disease, stage 3 (moderate): Secondary | ICD-10-CM | POA: Diagnosis present

## 2017-04-13 DIAGNOSIS — Z8249 Family history of ischemic heart disease and other diseases of the circulatory system: Secondary | ICD-10-CM

## 2017-04-13 DIAGNOSIS — R05 Cough: Secondary | ICD-10-CM

## 2017-04-13 DIAGNOSIS — M1712 Unilateral primary osteoarthritis, left knee: Secondary | ICD-10-CM

## 2017-04-13 DIAGNOSIS — Z96652 Presence of left artificial knee joint: Secondary | ICD-10-CM

## 2017-04-13 DIAGNOSIS — Z79899 Other long term (current) drug therapy: Secondary | ICD-10-CM | POA: Diagnosis not present

## 2017-04-13 DIAGNOSIS — Z888 Allergy status to other drugs, medicaments and biological substances status: Secondary | ICD-10-CM

## 2017-04-13 DIAGNOSIS — Z8711 Personal history of peptic ulcer disease: Secondary | ICD-10-CM | POA: Diagnosis not present

## 2017-04-13 DIAGNOSIS — I701 Atherosclerosis of renal artery: Secondary | ICD-10-CM | POA: Diagnosis not present

## 2017-04-13 DIAGNOSIS — Z79891 Long term (current) use of opiate analgesic: Secondary | ICD-10-CM

## 2017-04-13 DIAGNOSIS — I130 Hypertensive heart and chronic kidney disease with heart failure and stage 1 through stage 4 chronic kidney disease, or unspecified chronic kidney disease: Secondary | ICD-10-CM | POA: Diagnosis present

## 2017-04-13 DIAGNOSIS — I509 Heart failure, unspecified: Secondary | ICD-10-CM | POA: Diagnosis present

## 2017-04-13 DIAGNOSIS — M21162 Varus deformity, not elsewhere classified, left knee: Secondary | ICD-10-CM | POA: Diagnosis not present

## 2017-04-13 DIAGNOSIS — E1122 Type 2 diabetes mellitus with diabetic chronic kidney disease: Secondary | ICD-10-CM | POA: Diagnosis present

## 2017-04-13 DIAGNOSIS — M170 Bilateral primary osteoarthritis of knee: Principal | ICD-10-CM

## 2017-04-13 DIAGNOSIS — Z791 Long term (current) use of non-steroidal anti-inflammatories (NSAID): Secondary | ICD-10-CM

## 2017-04-13 DIAGNOSIS — J069 Acute upper respiratory infection, unspecified: Secondary | ICD-10-CM | POA: Diagnosis not present

## 2017-04-13 DIAGNOSIS — M25762 Osteophyte, left knee: Secondary | ICD-10-CM | POA: Diagnosis present

## 2017-04-13 DIAGNOSIS — Z7984 Long term (current) use of oral hypoglycemic drugs: Secondary | ICD-10-CM

## 2017-04-13 DIAGNOSIS — R058 Other specified cough: Secondary | ICD-10-CM

## 2017-04-13 DIAGNOSIS — Z91048 Other nonmedicinal substance allergy status: Secondary | ICD-10-CM | POA: Diagnosis not present

## 2017-04-13 HISTORY — PX: TOTAL KNEE ARTHROPLASTY: SHX125

## 2017-04-13 LAB — GLUCOSE, CAPILLARY
GLUCOSE-CAPILLARY: 103 mg/dL — AB (ref 65–99)
Glucose-Capillary: 157 mg/dL — ABNORMAL HIGH (ref 65–99)
Glucose-Capillary: 133 mg/dL — ABNORMAL HIGH (ref 65–99)
Glucose-Capillary: 194 mg/dL — ABNORMAL HIGH (ref 65–99)

## 2017-04-13 LAB — HEMOGLOBIN A1C
HEMOGLOBIN A1C: 7.1 % — AB (ref 4.8–5.6)
Mean Plasma Glucose: 157.07 mg/dL

## 2017-04-13 SURGERY — ARTHROPLASTY, KNEE, TOTAL
Anesthesia: General | Laterality: Left

## 2017-04-13 MED ORDER — BUPIVACAINE LIPOSOME 1.3 % IJ SUSP
20.0000 mL | INTRAMUSCULAR | Status: DC
  Filled 2017-04-13: qty 20

## 2017-04-13 MED ORDER — TRANEXAMIC ACID 1000 MG/10ML IV SOLN
1000.0000 mg | INTRAVENOUS | Status: AC
  Administered 2017-04-13: 1000 mg via INTRAVENOUS
  Filled 2017-04-13: qty 1100

## 2017-04-13 MED ORDER — LACTATED RINGERS IV SOLN
INTRAVENOUS | Status: DC
  Administered 2017-04-13 (×2): via INTRAVENOUS

## 2017-04-13 MED ORDER — INSULIN ASPART 100 UNIT/ML ~~LOC~~ SOLN
0.0000 [IU] | Freq: Three times a day (TID) | SUBCUTANEOUS | Status: DC
  Administered 2017-04-14: 5 [IU] via SUBCUTANEOUS
  Administered 2017-04-14: 7 [IU] via SUBCUTANEOUS
  Administered 2017-04-14: 3 [IU] via SUBCUTANEOUS
  Administered 2017-04-15: 5 [IU] via SUBCUTANEOUS
  Administered 2017-04-15 – 2017-04-16 (×4): 3 [IU] via SUBCUTANEOUS
  Administered 2017-04-16 – 2017-04-17 (×3): 2 [IU] via SUBCUTANEOUS

## 2017-04-13 MED ORDER — PROMETHAZINE HCL 25 MG/ML IJ SOLN: 6.2500 mg | INTRAMUSCULAR | Status: DC | PRN

## 2017-04-13 MED ORDER — PHENYLEPHRINE HCL 10 MG/ML IJ SOLN
INTRAVENOUS | Status: DC | PRN
  Administered 2017-04-13: 25 ug/min via INTRAVENOUS

## 2017-04-13 MED ORDER — VITAMIN D 1000 UNITS PO TABS
1000.0000 [IU] | ORAL_TABLET | ORAL | Status: DC
  Administered 2017-04-14 – 2017-04-16 (×2): 1000 [IU] via ORAL
  Filled 2017-04-13 (×2): qty 1

## 2017-04-13 MED ORDER — MENTHOL 3 MG MT LOZG: 1.0000 | LOZENGE | OROMUCOSAL | Status: DC | PRN

## 2017-04-13 MED ORDER — MORPHINE SULFATE (PF) 2 MG/ML IV SOLN: 0.5000 mg | INTRAVENOUS | Status: DC | PRN

## 2017-04-13 MED ORDER — HYDROCODONE-ACETAMINOPHEN 5-325 MG PO TABS
1.0000 | ORAL_TABLET | ORAL | Status: DC | PRN
  Administered 2017-04-14 – 2017-04-16 (×7): 2 via ORAL
  Filled 2017-04-13 (×7): qty 2

## 2017-04-13 MED ORDER — HYDROMORPHONE HCL 1 MG/ML IJ SOLN
INTRAMUSCULAR | Status: AC
  Administered 2017-04-13: 0.5 mg via INTRAVENOUS
  Filled 2017-04-13: qty 1

## 2017-04-13 MED ORDER — FLUOCINONIDE 0.05 % EX CREA
1.0000 "application " | TOPICAL_CREAM | Freq: Every day | CUTANEOUS | Status: DC | PRN
  Filled 2017-04-13: qty 30

## 2017-04-13 MED ORDER — GABAPENTIN 300 MG PO CAPS
300.0000 mg | ORAL_CAPSULE | Freq: Three times a day (TID) | ORAL | Status: DC
  Administered 2017-04-13 – 2017-04-17 (×11): 300 mg via ORAL
  Filled 2017-04-13 (×11): qty 1

## 2017-04-13 MED ORDER — CHLORHEXIDINE GLUCONATE 4 % EX LIQD: 60.0000 mL | Freq: Once | CUTANEOUS | Status: DC

## 2017-04-13 MED ORDER — FLEET ENEMA 7-19 GM/118ML RE ENEM: 1.0000 | ENEMA | Freq: Once | RECTAL | Status: DC | PRN

## 2017-04-13 MED ORDER — OXYCODONE HCL 5 MG PO TABS
ORAL_TABLET | ORAL | Status: AC
  Administered 2017-04-13: 5 mg via ORAL
  Filled 2017-04-13: qty 1

## 2017-04-13 MED ORDER — LIDOCAINE 2% (20 MG/ML) 5 ML SYRINGE
INTRAMUSCULAR | Status: AC
  Filled 2017-04-13: qty 5

## 2017-04-13 MED ORDER — SODIUM CHLORIDE 0.9 % IR SOLN
Status: DC | PRN
  Administered 2017-04-13: 1000 mL

## 2017-04-13 MED ORDER — CARVEDILOL 25 MG PO TABS
25.0000 mg | ORAL_TABLET | Freq: Every day | ORAL | Status: DC
  Administered 2017-04-13 – 2017-04-16 (×4): 25 mg via ORAL
  Filled 2017-04-13 (×4): qty 1

## 2017-04-13 MED ORDER — FENTANYL CITRATE (PF) 100 MCG/2ML IJ SOLN
50.0000 ug | Freq: Once | INTRAMUSCULAR | Status: AC
  Administered 2017-04-13: 50 ug via INTRAVENOUS

## 2017-04-13 MED ORDER — BIOTIN 2500 MCG PO CAPS: 2500.0000 ug | ORAL_CAPSULE | Freq: Every day | ORAL | Status: DC

## 2017-04-13 MED ORDER — POLYETHYLENE GLYCOL 3350 17 G PO PACK: 17.0000 g | PACK | Freq: Every day | ORAL | Status: DC | PRN

## 2017-04-13 MED ORDER — ONDANSETRON HCL 4 MG PO TABS: 4.0000 mg | ORAL_TABLET | Freq: Four times a day (QID) | ORAL | Status: DC | PRN

## 2017-04-13 MED ORDER — SUGAMMADEX SODIUM 200 MG/2ML IV SOLN
INTRAVENOUS | Status: DC | PRN
  Administered 2017-04-13: 200 mg via INTRAVENOUS

## 2017-04-13 MED ORDER — FUROSEMIDE 40 MG PO TABS: 40.0000 mg | ORAL_TABLET | Freq: Every day | ORAL | Status: DC | PRN

## 2017-04-13 MED ORDER — MIDAZOLAM HCL 2 MG/2ML IJ SOLN
1.0000 mg | Freq: Once | INTRAMUSCULAR | Status: AC
  Administered 2017-04-13: 1 mg via INTRAVENOUS

## 2017-04-13 MED ORDER — PHENOL 1.4 % MT LIQD: 1.0000 | OROMUCOSAL | Status: DC | PRN

## 2017-04-13 MED ORDER — HYDROCHLOROTHIAZIDE 25 MG PO TABS
25.0000 mg | ORAL_TABLET | Freq: Every day | ORAL | Status: DC
  Administered 2017-04-14 – 2017-04-16 (×3): 25 mg via ORAL
  Filled 2017-04-13 (×3): qty 1

## 2017-04-13 MED ORDER — LOSARTAN POTASSIUM 50 MG PO TABS
50.0000 mg | ORAL_TABLET | Freq: Every day | ORAL | Status: DC
  Administered 2017-04-13 – 2017-04-16 (×4): 50 mg via ORAL
  Filled 2017-04-13 (×4): qty 1

## 2017-04-13 MED ORDER — HYDROCODONE-ACETAMINOPHEN 7.5-325 MG PO TABS: 1.0000 | ORAL_TABLET | ORAL | Status: DC | PRN

## 2017-04-13 MED ORDER — DOCUSATE SODIUM 100 MG PO CAPS
100.0000 mg | ORAL_CAPSULE | Freq: Two times a day (BID) | ORAL | Status: DC
  Administered 2017-04-14 – 2017-04-17 (×6): 100 mg via ORAL
  Filled 2017-04-13 (×7): qty 1

## 2017-04-13 MED ORDER — SODIUM CHLORIDE 0.9 % IV SOLN
INTRAVENOUS | Status: DC
  Administered 2017-04-13: 23:00:00 via INTRAVENOUS

## 2017-04-13 MED ORDER — FENTANYL CITRATE (PF) 100 MCG/2ML IJ SOLN
INTRAMUSCULAR | Status: AC
  Administered 2017-04-13: 50 ug via INTRAVENOUS
  Filled 2017-04-13: qty 2

## 2017-04-13 MED ORDER — CLINDAMYCIN PHOSPHATE 2 % VA CREA
1.0000 | TOPICAL_CREAM | Freq: Every day | VAGINAL | Status: DC | PRN
  Filled 2017-04-13: qty 40

## 2017-04-13 MED ORDER — OXYCODONE HCL 5 MG/5ML PO SOLN: 5.0000 mg | Freq: Once | ORAL | Status: AC | PRN

## 2017-04-13 MED ORDER — CEFAZOLIN SODIUM-DEXTROSE 2-4 GM/100ML-% IV SOLN
2.0000 g | INTRAVENOUS | Status: AC
Start: 2017-04-13 — End: 2017-04-13
  Administered 2017-04-13: 2 g via INTRAVENOUS

## 2017-04-13 MED ORDER — FENTANYL CITRATE (PF) 250 MCG/5ML IJ SOLN
INTRAMUSCULAR | Status: AC
  Filled 2017-04-13: qty 5

## 2017-04-13 MED ORDER — SUGAMMADEX SODIUM 200 MG/2ML IV SOLN
INTRAVENOUS | Status: AC
  Filled 2017-04-13: qty 2

## 2017-04-13 MED ORDER — HYDROMORPHONE HCL 1 MG/ML IJ SOLN
0.2500 mg | INTRAMUSCULAR | Status: DC | PRN
  Administered 2017-04-13 (×3): 0.5 mg via INTRAVENOUS

## 2017-04-13 MED ORDER — ONDANSETRON HCL 4 MG/2ML IJ SOLN
INTRAMUSCULAR | Status: AC
  Filled 2017-04-13: qty 2

## 2017-04-13 MED ORDER — BUPIVACAINE HCL (PF) 0.25 % IJ SOLN
INTRAMUSCULAR | Status: DC | PRN
  Administered 2017-04-13: 30 mL

## 2017-04-13 MED ORDER — DIPHENHYDRAMINE HCL 12.5 MG/5ML PO ELIX: 12.5000 mg | ORAL_SOLUTION | ORAL | Status: DC | PRN

## 2017-04-13 MED ORDER — BISACODYL 5 MG PO TBEC: 5.0000 mg | DELAYED_RELEASE_TABLET | Freq: Every day | ORAL | Status: DC | PRN

## 2017-04-13 MED ORDER — DEXAMETHASONE SODIUM PHOSPHATE 10 MG/ML IJ SOLN
INTRAMUSCULAR | Status: AC
  Filled 2017-04-13: qty 1

## 2017-04-13 MED ORDER — MIDAZOLAM HCL 2 MG/2ML IJ SOLN
INTRAMUSCULAR | Status: AC
  Administered 2017-04-13: 1 mg via INTRAVENOUS
  Filled 2017-04-13: qty 2

## 2017-04-13 MED ORDER — GLYCOPYRROLATE 0.2 MG/ML IJ SOLN
INTRAMUSCULAR | Status: DC | PRN
  Administered 2017-04-13: 0.2 mg via INTRAVENOUS

## 2017-04-13 MED ORDER — ACETAMINOPHEN 325 MG PO TABS: 325.0000 mg | ORAL_TABLET | Freq: Four times a day (QID) | ORAL | Status: DC | PRN

## 2017-04-13 MED ORDER — EPHEDRINE SULFATE 50 MG/ML IJ SOLN
INTRAMUSCULAR | Status: DC | PRN
  Administered 2017-04-13 (×3): 10 mg via INTRAVENOUS

## 2017-04-13 MED ORDER — ONDANSETRON HCL 4 MG/2ML IJ SOLN
INTRAMUSCULAR | Status: DC | PRN
  Administered 2017-04-13: 4 mg via INTRAVENOUS

## 2017-04-13 MED ORDER — PROPOFOL 10 MG/ML IV BOLUS
INTRAVENOUS | Status: DC | PRN
  Administered 2017-04-13: 100 mg via INTRAVENOUS

## 2017-04-13 MED ORDER — METHYLPREDNISOLONE ACETATE 40 MG/ML IJ SUSP
INTRAMUSCULAR | Status: DC | PRN
  Administered 2017-04-13: 6 mL via INTRAMUSCULAR

## 2017-04-13 MED ORDER — BUPIVACAINE HCL (PF) 0.25 % IJ SOLN
INTRAMUSCULAR | Status: AC
  Filled 2017-04-13: qty 30

## 2017-04-13 MED ORDER — ASPIRIN EC 325 MG PO TBEC
325.0000 mg | DELAYED_RELEASE_TABLET | Freq: Every day | ORAL | Status: DC
  Administered 2017-04-14 – 2017-04-17 (×4): 325 mg via ORAL
  Filled 2017-04-13 (×4): qty 1

## 2017-04-13 MED ORDER — LIRAGLUTIDE 18 MG/3ML ~~LOC~~ SOPN: 0.6000 mg | PEN_INJECTOR | Freq: Every morning | SUBCUTANEOUS | Status: DC

## 2017-04-13 MED ORDER — METOCLOPRAMIDE HCL 5 MG/ML IJ SOLN: 5.0000 mg | Freq: Three times a day (TID) | INTRAMUSCULAR | Status: DC | PRN

## 2017-04-13 MED ORDER — METOCLOPRAMIDE HCL 5 MG PO TABS: 5.0000 mg | ORAL_TABLET | Freq: Three times a day (TID) | ORAL | Status: DC | PRN

## 2017-04-13 MED ORDER — METHYLPREDNISOLONE ACETATE 40 MG/ML IJ SUSP
INTRAMUSCULAR | Status: AC
  Filled 2017-04-13: qty 1

## 2017-04-13 MED ORDER — PROPOFOL 10 MG/ML IV BOLUS
INTRAVENOUS | Status: AC
  Filled 2017-04-13: qty 20

## 2017-04-13 MED ORDER — OXYCODONE HCL 5 MG PO TABS
5.0000 mg | ORAL_TABLET | Freq: Once | ORAL | Status: AC | PRN
  Administered 2017-04-13: 5 mg via ORAL

## 2017-04-13 MED ORDER — TRAMADOL HCL 50 MG PO TABS
50.0000 mg | ORAL_TABLET | Freq: Four times a day (QID) | ORAL | Status: DC
  Administered 2017-04-13 – 2017-04-17 (×13): 50 mg via ORAL
  Filled 2017-04-13 (×14): qty 1

## 2017-04-13 MED ORDER — ACETAMINOPHEN 500 MG PO TABS
500.0000 mg | ORAL_TABLET | Freq: Four times a day (QID) | ORAL | Status: AC
  Administered 2017-04-13 – 2017-04-14 (×4): 500 mg via ORAL
  Filled 2017-04-13 (×4): qty 1

## 2017-04-13 MED ORDER — ONDANSETRON HCL 4 MG/2ML IJ SOLN: 4.0000 mg | Freq: Four times a day (QID) | INTRAMUSCULAR | Status: DC | PRN

## 2017-04-13 MED ORDER — ACETAMINOPHEN 10 MG/ML IV SOLN: 1000.0000 mg | Freq: Once | INTRAVENOUS | Status: DC | PRN

## 2017-04-13 MED ORDER — POTASSIUM CHLORIDE CRYS ER 20 MEQ PO TBCR: 20.0000 meq | EXTENDED_RELEASE_TABLET | Freq: Every day | ORAL | Status: DC | PRN

## 2017-04-13 MED ORDER — ATORVASTATIN CALCIUM 20 MG PO TABS
20.0000 mg | ORAL_TABLET | ORAL | Status: DC
  Administered 2017-04-13 – 2017-04-16 (×2): 20 mg via ORAL
  Filled 2017-04-13 (×2): qty 1

## 2017-04-13 MED ORDER — BUPIVACAINE-EPINEPHRINE (PF) 0.5% -1:200000 IJ SOLN
INTRAMUSCULAR | Status: DC | PRN
  Administered 2017-04-13: 20 mL via PERINEURAL

## 2017-04-13 MED ORDER — MEPERIDINE HCL 50 MG/ML IJ SOLN: 6.2500 mg | INTRAMUSCULAR | Status: DC | PRN

## 2017-04-13 MED ORDER — ROCURONIUM BROMIDE 100 MG/10ML IV SOLN
INTRAVENOUS | Status: DC | PRN
  Administered 2017-04-13: 50 mg via INTRAVENOUS

## 2017-04-13 MED ORDER — LIDOCAINE HCL (CARDIAC) 20 MG/ML IV SOLN
INTRAVENOUS | Status: DC | PRN
  Administered 2017-04-13: 100 mg via INTRATRACHEAL

## 2017-04-13 MED ORDER — HYDRALAZINE HCL 50 MG PO TABS
50.0000 mg | ORAL_TABLET | Freq: Two times a day (BID) | ORAL | Status: DC
  Administered 2017-04-16 – 2017-04-17 (×2): 50 mg via ORAL
  Filled 2017-04-13 (×5): qty 1

## 2017-04-13 MED ORDER — FENTANYL CITRATE (PF) 250 MCG/5ML IJ SOLN
INTRAMUSCULAR | Status: DC | PRN
  Administered 2017-04-13: 50 ug via INTRAVENOUS
  Administered 2017-04-13: 100 ug via INTRAVENOUS

## 2017-04-13 MED ORDER — DEXAMETHASONE SODIUM PHOSPHATE 10 MG/ML IJ SOLN
INTRAMUSCULAR | Status: DC | PRN
  Administered 2017-04-13: 10 mg via INTRAVENOUS

## 2017-04-13 SURGICAL SUPPLY — 66 items
BANDAGE ACE 4X5 VEL STRL LF (GAUZE/BANDAGES/DRESSINGS) ×3 IMPLANT
BANDAGE ACE 6X5 VEL STRL LF (GAUZE/BANDAGES/DRESSINGS) ×3 IMPLANT
BANDAGE ESMARK 6X9 LF (GAUZE/BANDAGES/DRESSINGS) ×1 IMPLANT
BENZOIN TINCTURE PRP APPL 2/3 (GAUZE/BANDAGES/DRESSINGS) ×3 IMPLANT
BLADE SAG 18X100X1.27 (BLADE) ×6 IMPLANT
BLADE SAW SGTL 13X75X1.27 (BLADE) ×3 IMPLANT
BNDG ELASTIC 6X10 VLCR STRL LF (GAUZE/BANDAGES/DRESSINGS) ×3 IMPLANT
BNDG ESMARK 6X9 LF (GAUZE/BANDAGES/DRESSINGS) ×3
BOWL SMART MIX CTS (DISPOSABLE) ×3 IMPLANT
CAPT KNEE TOTAL 3 ATTUNE ×3 IMPLANT
CEMENT HV SMART SET (Cement) ×6 IMPLANT
CLOSURE WOUND 1/2 X4 (GAUZE/BANDAGES/DRESSINGS) ×2
COVER SURGICAL LIGHT HANDLE (MISCELLANEOUS) ×3 IMPLANT
CUFF TOURNIQUET SINGLE 34IN LL (TOURNIQUET CUFF) ×3 IMPLANT
CUFF TOURNIQUET SINGLE 44IN (TOURNIQUET CUFF) IMPLANT
DRAPE ORTHO SPLIT 77X108 STRL (DRAPES) ×4
DRAPE SURG ORHT 6 SPLT 77X108 (DRAPES) ×2 IMPLANT
DRAPE U-SHAPE 47X51 STRL (DRAPES) ×3 IMPLANT
DRSG ADAPTIC 3X8 NADH LF (GAUZE/BANDAGES/DRESSINGS) ×3 IMPLANT
DRSG AQUACEL AG ADV 3.5X10 (GAUZE/BANDAGES/DRESSINGS) ×3 IMPLANT
DRSG PAD ABDOMINAL 8X10 ST (GAUZE/BANDAGES/DRESSINGS) ×3 IMPLANT
DURAPREP 26ML APPLICATOR (WOUND CARE) ×3 IMPLANT
ELECT REM PT RETURN 9FT ADLT (ELECTROSURGICAL) ×3
ELECTRODE REM PT RTRN 9FT ADLT (ELECTROSURGICAL) ×1 IMPLANT
EVACUATOR 1/8 PVC DRAIN (DRAIN) IMPLANT
FACESHIELD WRAPAROUND (MASK) ×6 IMPLANT
GAUZE SPONGE 4X4 12PLY STRL (GAUZE/BANDAGES/DRESSINGS) ×3 IMPLANT
GLOVE BIOGEL PI IND STRL 8 (GLOVE) ×1 IMPLANT
GLOVE BIOGEL PI INDICATOR 8 (GLOVE) ×2
GLOVE ECLIPSE 9.0 STRL (GLOVE) ×3 IMPLANT
GLOVE ORTHO TXT STRL SZ7.5 (GLOVE) ×3 IMPLANT
GLOVE SURG 8.5 LATEX PF (GLOVE) ×3 IMPLANT
GOWN STRL REUS W/ TWL LRG LVL3 (GOWN DISPOSABLE) ×1 IMPLANT
GOWN STRL REUS W/TWL 2XL LVL3 (GOWN DISPOSABLE) ×6 IMPLANT
GOWN STRL REUS W/TWL LRG LVL3 (GOWN DISPOSABLE) ×2
HANDPIECE INTERPULSE COAX TIP (DISPOSABLE) ×2
IMMOBILIZER KNEE 22 UNIV (SOFTGOODS) IMPLANT
KIT BASIN OR (CUSTOM PROCEDURE TRAY) ×3 IMPLANT
KIT TURNOVER KIT B (KITS) ×3 IMPLANT
MANIFOLD NEPTUNE II (INSTRUMENTS) ×3 IMPLANT
NEEDLE HYPO 25X1 1.5 SAFETY (NEEDLE) IMPLANT
NS IRRIG 1000ML POUR BTL (IV SOLUTION) ×3 IMPLANT
PACK TOTAL JOINT (CUSTOM PROCEDURE TRAY) ×3 IMPLANT
PAD ARMBOARD 7.5X6 YLW CONV (MISCELLANEOUS) ×6 IMPLANT
PAD CAST 4YDX4 CTTN HI CHSV (CAST SUPPLIES) ×1 IMPLANT
PADDING CAST COTTON 4X4 STRL (CAST SUPPLIES) ×2
PADDING CAST COTTON 6X4 STRL (CAST SUPPLIES) ×3 IMPLANT
SET HNDPC FAN SPRY TIP SCT (DISPOSABLE) ×1 IMPLANT
STAPLER VISISTAT 35W (STAPLE) IMPLANT
STRIP CLOSURE SKIN 1/2X4 (GAUZE/BANDAGES/DRESSINGS) ×4 IMPLANT
SUCTION FRAZIER HANDLE 10FR (MISCELLANEOUS)
SUCTION TUBE FRAZIER 10FR DISP (MISCELLANEOUS) IMPLANT
SUT BONE WAX W31G (SUTURE) IMPLANT
SUT VIC AB 0 CT1 27 (SUTURE) ×4
SUT VIC AB 0 CT1 27XBRD ANBCTR (SUTURE) ×2 IMPLANT
SUT VIC AB 1 CT1 27 (SUTURE) ×4
SUT VIC AB 1 CT1 27XBRD ANBCTR (SUTURE) ×2 IMPLANT
SUT VIC AB 2-0 CT1 27 (SUTURE) ×6
SUT VIC AB 2-0 CT1 TAPERPNT 27 (SUTURE) ×3 IMPLANT
SUT VICRYL 4-0 PS2 18IN ABS (SUTURE) ×3 IMPLANT
SYR CONTROL 10ML LL (SYRINGE) IMPLANT
TOWEL OR 17X24 6PK STRL BLUE (TOWEL DISPOSABLE) ×3 IMPLANT
TOWEL OR 17X26 10 PK STRL BLUE (TOWEL DISPOSABLE) ×3 IMPLANT
TRAY CATH 16FR W/PLASTIC CATH (SET/KITS/TRAYS/PACK) IMPLANT
TRAY FOLEY CATH SILVER 16FR (SET/KITS/TRAYS/PACK) IMPLANT
WATER STERILE IRR 1000ML POUR (IV SOLUTION) ×3 IMPLANT

## 2017-04-13 NOTE — Anesthesia Preprocedure Evaluation (Addendum)
Anesthesia Evaluation  Patient identified by MRN, date of birth, ID band Patient awake    Reviewed: Allergy & Precautions, NPO status , Patient's Chart, lab work & pertinent test results  Airway Mallampati: III  TM Distance: <3 FB Neck ROM: Full    Dental no notable dental hx.    Pulmonary neg pulmonary ROS,    Pulmonary exam normal breath sounds clear to auscultation       Cardiovascular hypertension, Pt. on home beta blockers and Pt. on medications +CHF  Normal cardiovascular exam Rhythm:Regular Rate:Normal  Lexiscan 03/2017:  There is no evidence of ischemia. Normal LV function with EF of 68 %.   Neuro/Psych negative neurological ROS  negative psych ROS   GI/Hepatic Neg liver ROS, PUD,   Endo/Other  negative endocrine ROSdiabetes  Renal/GU Renal disease     Musculoskeletal  (+) Arthritis ,   Abdominal   Peds  Hematology negative hematology ROS (+)   Anesthesia Other Findings   Reproductive/Obstetrics negative OB ROS                            Anesthesia Physical Anesthesia Plan  ASA: III  Anesthesia Plan: General   Post-op Pain Management: GA combined w/ Regional for post-op pain   Induction: Intravenous  PONV Risk Score and Plan: 4 or greater and Dexamethasone  Airway Management Planned: LMA  Additional Equipment:   Intra-op Plan:   Post-operative Plan: Extubation in OR  Informed Consent: I have reviewed the patients History and Physical, chart, labs and discussed the procedure including the risks, benefits and alternatives for the proposed anesthesia with the patient or authorized representative who has indicated his/her understanding and acceptance.   Dental advisory given  Plan Discussed with: CRNA  Anesthesia Plan Comments:        Anesthesia Quick Evaluation

## 2017-04-13 NOTE — Anesthesia Procedure Notes (Signed)
Anesthesia Regional Block: Adductor canal block   Pre-Anesthetic Checklist: ,, timeout performed, Correct Patient, Correct Site, Correct Laterality, Correct Procedure, Correct Position, site marked, Risks and benefits discussed, pre-op evaluation,  At surgeon's request and post-op pain management  Laterality: Left  Prep: chloraprep       Needles:   Needle Type: Echogenic Needle     Needle Length: 9cm  Needle Gauge: 21     Additional Needles:   Procedures:,,,, ultrasound used (permanent image in chart),,,,  Narrative:  Start time: 04/13/2017 1:02 PM End time: 04/13/2017 1:10 PM Injection made incrementally with aspirations every 5 mL. Anesthesiologist: Lyndle Herrlich, MD

## 2017-04-13 NOTE — Progress Notes (Signed)
Orthopedic Tech Progress Note Patient Details:  Angelica French [birth year] [id number]  CPM Left Knee CPM Left Knee: On Left Knee Flexion (Degrees): 60 Left Knee Extension (Degrees): 0     [name] 04/13/2017, 5:39 PM

## 2017-04-13 NOTE — Brief Op Note (Signed)
04/13/2017  4:10 PM  PATIENT:  Angelica French  77 y.o. female  PRE-OPERATIVE DIAGNOSIS:  bilateral knee osteoarthritis  POST-OPERATIVE DIAGNOSIS:  bilateral knee osteoarthritis  PROCEDURE:  Procedure(s): LEFT TOTAL KNEE ARTHROPLASTY AND RIGHT KNEE INTRA-ARTICULAR STEROID INJECTION (Left)  SURGEON:  Surgeon(s) and Role:    * Jessy Oto, MD - Primary  PHYSICIAN ASSISTANT: Benjiman Core, PA-C  ANESTHESIA:   local and general, Dr.  Lissa Hoard  EBL:  150 mL   BLOOD ADMINISTERED:none  DRAINS: Urinary Catheter (Foley)   LOCAL MEDICATIONS USED:  Left knee, MARCAINE 0.25% 1:1 EXPAREL 1.3% Amount: 30 ml.  Right knee 4cc MARCAINE 0.25% and 1cc 40mg  Depomedrol per cc.  SPECIMEN:  No Specimen  DISPOSITION OF SPECIMEN:  N/A  COUNTS:  YES  TOURNIQUET:   Total Tourniquet Time Documented: Thigh (Left) - 96 minutes Total: Thigh (Left) - 96 minutes   DICTATION: .Viviann Spare Dictation  PLAN OF CARE: Admit to inpatient   PATIENT DISPOSITION:  PACU - hemodynamically stable.   Delay start of Pharmacological VTE agent (>24hrs) due to surgical blood loss or risk of bleeding: no

## 2017-04-13 NOTE — Progress Notes (Signed)
PHARMACIST - PHYSICIAN ORDER COMMUNICATION  CONCERNING: P&T Medication Policy on Herbal Medications  DESCRIPTION:  This patient's order for:  Biotin  has been noted.  This product(s) is classified as an "herbal" or natural product. Due to a lack of definitive safety studies or FDA approval, nonstandard manufacturing practices, plus the potential risk of unknown drug-drug interactions while on inpatient medications, the Pharmacy and Therapeutics Committee does not permit the use of "herbal" or natural products of this type within [facility].   ACTION TAKEN: The pharmacy department is unable to verify this order at this time. Please reevaluate patient's clinical condition at discharge and address if the herbal or natural product(s) should be resumed at that time.   Consuello Masse, RPh 04/13/2017 8:59 PM

## 2017-04-13 NOTE — Anesthesia Procedure Notes (Signed)
Procedure Name: Intubation Date/Time: 04/13/2017 1:35 PM Performed by: Mariea Clonts, CRNA Pre-anesthesia Checklist: Patient identified, Emergency Drugs available, Suction available and Patient being monitored Patient Re-evaluated:Patient Re-evaluated prior to induction Oxygen Delivery Method: Circle System Utilized Preoxygenation: Pre-oxygenation with 100% oxygen Induction Type: IV induction Ventilation: Mask ventilation without difficulty Laryngoscope Size: Miller and 2 Grade View: Grade I Tube type: Oral Tube size: 7.0 mm Number of attempts: 1 Airway Equipment and Method: Stylet and Oral airway Placement Confirmation: ETT inserted through vocal cords under direct vision,  positive ETCO2 and breath sounds checked- equal and bilateral Secured at: 21 cm Tube secured with: Tape Dental Injury: Teeth and Oropharynx as per pre-operative assessment

## 2017-04-13 NOTE — Transfer of Care (Signed)
Immediate Anesthesia Transfer of Care Note  Patient: Angelica French  Procedure(s) Performed: LEFT TOTAL KNEE ARTHROPLASTY AND RIGHT KNEE INTRA-ARTICULAR STEROID INJECTION (Left )  Patient Location: PACU  Anesthesia Type:General and Regional  Level of Consciousness: awake, alert  and oriented  Airway & Oxygen Therapy: Patient Spontanous Breathing and Patient connected to nasal cannula oxygen  Post-op Assessment: Report given to RN and Post -op Vital signs reviewed and stable  Post vital signs: Reviewed and stable  Last Vitals:  Vitals Value Taken Time  BP 113/53 04/13/2017  4:41 PM  Temp 36.4 C 04/13/2017  4:41 PM  Pulse 71 04/13/2017  4:48 PM  Resp 8 04/13/2017  4:48 PM  SpO2 100 % 04/13/2017  4:48 PM  Vitals shown include unvalidated device data.  Last Pain:  Vitals:   04/13/17 1648  TempSrc:   PainSc: 5       Patients Stated Pain Goal: 2 (78/29/56 2130)  Complications: No apparent anesthesia complications

## 2017-04-13 NOTE — Op Note (Signed)
04/13/2017  4:21 PM  PATIENT:  Angelica French  77 y.o. female  MRN: [id number]  OPERATIVE REPORT   PRE-OPERATIVE DIAGNOSIS:  bilateral knee osteoarthritis  POST-OPERATIVE DIAGNOSIS:  bilateral knee osteoarthritis  PROCEDURE:  Procedure(s): LEFT TOTAL KNEE ARTHROPLASTY AND RIGHT KNEE INTRA-ARTICULAR STEROID INJECTION    SURGEON: [name], MD     ASSISTANT:  Benjiman Core, PA-C  (Present throughout the entire procedure and necessary for completion of procedure in a timely manner)     ANESTHESIA:  General with supplemental left block.     COMPLICATIONS:  None.   EBL:100CC  TOTAL TOURNIQUET TIME:        95  MIN @   275  Mm Hg    COMPONENTS:  DePuy Attune cemented total knee system.  A #3 femoral component.  A #4 tibial tray with a 8.0 mm polyethylene RP tibial spacer , a 35 mm polyethylene patella.   PROCEDURE:The patient was met in the holding area, and the appropriate left knee identified and marked with "X" and my initials. The patient did not received a preoperative femoral nerve block by anesthesia.  The patient was then transported to OR and was placed on the operative table in a supine position. The patient was then placed under general anesthesia without difficulty.The right knee anteromedial was prepped with iodofor and a 22 G needle inserted into the right knee joint line medial. A solution of 4cc of 0.25 % marcaine and 1cc of 44mg/cc depomedrol was instilled into the right knee, a bandaid was applied. The patient received appropriate preoperative antibiotic prophylaxis. The patien'ts left knee was examined under anesthesia and shown to have 0-130 range of motion,varus deformity, ligamentously stable, and normal patella tracking.Tourniquet was applied to the operative thigh. Leg was then prepped using sterile conditions and draped using sterile technique. Time-out procedure was called and correct left knee identified.  The leg was elevated and Esmarch exsanguinated with  a thigh  tourniquet elevated ot 275 mmHg.  Initially, through a 13-cm longitudinal incision based over the patella initial exposure was made. The underlying subcutaneous tissues were incised along with the skin incision. A median arthrotomy was performed revealing an excessive amount of normal-appearing joint fluid, The articular surfaces were inspected. The patient had grade 4 changes medially, grade 3 changes laterally, and grade 4 changes in the patellofemoral joint. Osteophytes were removed from the femoral condyles and the tibial plateau. The medial and lateral meniscal remnants were removed as well as the anterior cruciate ligament.   Attention then turned to the femur where the knee was flexed to 90 degrees and an intramedullary guide placed a drill hole placed just anterior 4 mm to the intercondylar notch on the distal femur. The distal cutting jig then attached to the intramedulllay nail 5 degrees of valgus for the left knee. A planned 9 mm to be removed off the most prominent condyle, medial in this case. The jig pinned in place and the distal femoral cut performed.  Anterior bow of the femur measured at 0. Based on this then the amount of proximal tibial cut was determined to be in line with off the depressed medial side of the joint 21m Using the proximal tibial cutting jig with the leg alignment guide the jig was pinned to the proximal tibia. 3 pins were used 0 degrees of posterior slope.Tibia then subluxed and proximal tibial cut was then performed. Soft tissue tensioning then examined and found to be well balanced in extension. #4 femoral component  chosen. The trial placed against the end of the femur and felt to be a good fit. Distal femur cutting jig was then carefully positioned on the distal femur using the end guide and placing 2 pins in the anterior end of the cut distal femur.  2 threaded pins used to pin the jig in place   Rotation guide was then corrected the alignment 2 pins placed over  the distal cut surface of the femur for placement of the jig for the chamfer cuts and coronal cuts. This is a carefully placed and held in place with threaded pins. Protecting soft tissues then the anterior cut was made after first verifying with the wing depth of the cut. Then the posterior coronal cuts protect soft tissue structures posteriorly. Posterior chamfer cut and anterior chamfer cut. These were done without difficulty. Cutting guide removed and box cutting jig was then applied to the distal femur carefully aligned and pinned into place. Box cut was then performed from the distal femur intercondylar box. Proximal tibia was then subluxed the posterior cruciate resected. A #4 plate and attached to the transverse cut surface of the proximal tibia using 2 pins. The upright for the reamer was then applied and reaming carried out for the keel for the tibial component. Using the osteotome was then inserted and impacted into place the temporary fixation pins removed from the proximal tibial plate. Femoral component was inserted using a trial component. Then a 8.0 mm insert placed and the knee brought into full extension full extension to 0 and 1.5 hyperextension was possible full flexion to 135 without difficulty or lift off. The components were accepted and the permanent #3 femoral and tibial components chosen and brought onto the field. Patella was observed to be a width anterior to posterior 21 mm and the residual 16 mm was allowed for the patella component. The cutting jig was then adjusted to allow for 16 mm remaining width. Applied and then the coronal cut the posterior aspect of the patella was performed. Lateral facet of patella showed very little resection. The trial 35 mm patella was chosen based on the size of the cut surface of patella. The 35 mm drill plate applied and drill holes placed through the posterior aspect of the patella. Trial reduction with the 35 mm trial and the leg placed through  range of motion showed excellent motion full extension and flexion 135 patella tendency to elevate medially was clamped in place over the medial retinaculum patellar showed normal appearance with flexion extension. No shuck noted then the knee was stable to varus and valgus at 0 30 and 60. Expose were then removed and the knee was then irrigated with copious amounts of. Solution. A 35 mm permanent patella prosthesis also brought to the field. Cement was mixed the knee was subluxed and carefully soft tissues protected note that the lug holes in the distal femur where drill using the trial prosthesis and with the drill guide provided. With irrigation completed and permanent tibial components were then inserted into place using cement and a semi-putty state over the proximal aspect of the tibia cement was also applied to the deep surface of the tibial component as well as over the posterior runners of the femur and the deep surface of the patella component. These were then carefully place excess cement removed about the circumference of the tibial tray the femur was then placed applying cement to the cut it distal aspect of the femur and posterior chamfer area anterior chamfer  and anterior coronal cut surface small amount within the box. Femoral component was then impacted into place excess cement removed amounts of circumference including the posterior chamfer area and the posterior femoral condyle area. The trial tibial peg then placed in the tibial component and a 8.0 mm rotating platform trial was inserted knee brought into full extension observed on the appeared to be in 2 of valgus with full extension 1 of hyperextension. Cement was then applied to the posterior cut surface of the patella within each of the patella peg opening was then pin holes were placed for the cementing along the lateral aspect of the patella. Patella component was then carefully aligned and inserted over the posterior aspect of the  patella and clamped in place excess cement was then resected circumferentially using scalpel in order for her to preserve cement the lateral facet area. Cement was allowed to fully harden tourniquet was released while cement was nearly completely hardened. Following this then irrigation was carried down careful inspection demonstrated some small bleeders present over the lateral posterior aspect of the knee joint cauterized using electrocautery off to the medial aspect and the areas of geniculate arteries. There is no active bleeding present then at that time. Trial 8.0 mm tibial insert demonstrated stability of the anterior drawer and negative and the knee stable to varus valgus stress and 30 and 60 flexion and full extension. A 8.0 mm rotating platform insert was then chosen this was applied to the tibia using the permanent implant removing the trial examining tibia and determined there was no evident residual cement remaining. Also examined posterior runners I posterior aspect of the femoral condyles for any residual cement none was present. The tibial insert in place the reduced placed through range of motion and full extension flexion and 30 knee stable to varus stress at 0 30 and 60 anterior drawer negative. This component was accepted.Tourniquet released at 95 minutes.  A drain was not necessary the synovium reapproximated over the medial aspect of the knee with 0 Vicryl sutures.  The retinaculum of the knee and the quadriceps tendon were then reapproximated with interrupted #1 Vicryl sutures. Peritenon of the reapproximated with interrupted 0 Vicryl sutures deep subcutaneous layers approximated with interrupted 0 and 2-0 Vicryl sutures and the skin closed with a running subcutaneous stitch of 4-0 Vicryl. Dermabond was then applied. 30 cc of marcaine 1/2%1:1 exparel 1.3%instilled into the left knee via the hemovac drain. Adaptic 4 x 4's ABDs pads fixed to the skin with sterile labrum an Ace wrap applied  from the foot to the right upper thigh and knee immobilizer. All instrument and sponge counts were correct. Then reactivated extubated and returned to recovery room in satisfactory condition.  Benjiman Core, PA-C perform the duties of assistant surgeon she performed careful retraction of soft tissues assisted in addition the patient had removal on the OR table is present beginning the case the case and performed closure of the incision from the peritenon to the skin and application of dressing.  [name] 04/13/2017, 4:21 PM

## 2017-04-13 NOTE — Discharge Instructions (Addendum)
  Keep knee incision dry for 5 days post op then may wet while bathing. Therapy daily and CPM goal full extension and greater than 90 degrees flexion. Call if fever or chills or increased drainage. Go to ER if acutely short of breath or call for ambulance. Return for follow up in 2 weeks. May full weight bear on the surgical leg unless told otherwise. Use knee immobilizer until able to straight leg raise off bed with knee stable. In house walking for first 2 weeks. INSTRUCTIONS AFTER JOINT REPLACEMENT   o Remove items at home which could result in a fall. This includes throw rugs or furniture in walking pathways o ICE to the affected joint every three hours while awake for 30 minutes at a time, for at least the first 3-5 days, and then as needed for pain and swelling.  Continue to use ice for pain and swelling. You may notice swelling that will progress down to the foot and ankle.  This is normal after surgery.  Elevate your leg when you are not up walking on it.   o Continue to use the breathing machine you got in the hospital (incentive spirometer) which will help keep your temperature down.  It is common for your temperature to cycle up and down following surgery, especially at night when you are not up moving around and exerting yourself.  The breathing machine keeps your lungs expanded and your temperature down.   DIET:  As you were doing prior to hospitalization, we recommend a well-balanced diet.  DRESSING / WOUND CARE / SHOWERING  You may shower 3 days after surgery, but keep the wounds dry during showering.  You may use an occlusive plastic wrap (Press'n Seal for example), NO SOAKING/SUBMERGING IN THE BATHTUB.  If the bandage gets wet, change with a clean dry gauze.  If the incision gets wet, pat the wound dry with a clean towel.  ACTIVITY  o Increase activity slowly as tolerated, but follow the weight bearing instructions below.   o No driving for 6 weeks or until further  direction given by your physician.  You cannot drive while taking narcotics.  o No lifting or carrying greater than 10 lbs. until further directed by your surgeon. o Avoid periods of inactivity such as sitting longer than an hour when not asleep. This helps prevent blood clots.  o You may return to work once you are authorized by your doctor.     WEIGHT BEARING   Weight bearing as tolerated with assist device (walker, cane, etc) as directed, use it as long as suggested by your surgeon or therapist, typically at least 4-6 weeks.   EXERCISES  Results after joint replacement surgery are often greatly improved when you follow the exercise, range of motion and muscle strengthening exercises prescribed by your doctor. Safety measures are also important to protect the joint from further injury. Any time any of these exercises cause you to have increased pain or swelling, decrease what you are doing until you are comfortable again and then slowly increase them. If you have problems or questions, call your caregiver or physical therapist for advice.   Rehabilitation is important following a joint replacement. After just a few days of immobilization, the muscles of the leg can become weakened and shrink (atrophy).  These exercises are designed to build up the tone and strength of the thigh and leg muscles and to improve motion. Often times heat used for twenty to thirty minutes before working out   will loosen up your tissues and help with improving the range of motion but do not use heat for the first two weeks following surgery (sometimes heat can increase post-operative swelling).   These exercises can be done on a training (exercise) mat, on the floor, on a table or on a bed. Use whatever works the best and is most comfortable for you.    Use music or television while you are exercising so that the exercises are a pleasant break in your day. This will make your life better with the exercises acting as a  break in your routine that you can look forward to.   Perform all exercises about fifteen times, three times per day or as directed.  You should exercise both the operative leg and the other leg as well.  Exercises include:    Quad Sets - Tighten up the muscle on the front of the thigh (Quad) and hold for 5-10 seconds.    Straight Leg Raises - With your knee straight (if you were given a brace, keep it on), lift the leg to 60 degrees, hold for 3 seconds, and slowly lower the leg.  Perform this exercise against resistance later as your leg gets stronger.   Leg Slides: Lying on your back, slowly slide your foot toward your buttocks, bending your knee up off the floor (only go as far as is comfortable). Then slowly slide your foot back down until your leg is flat on the floor again.   Angel Wings: Lying on your back spread your legs to the side as far apart as you can without causing discomfort.   Hamstring Strength:  Lying on your back, push your heel against the floor with your leg straight by tightening up the muscles of your buttocks.  Repeat, but this time bend your knee to a comfortable angle, and push your heel against the floor.  You may put a pillow under the heel to make it more comfortable if necessary.   A rehabilitation program following joint replacement surgery can speed recovery and prevent re-injury in the future due to weakened muscles. Contact your doctor or a physical therapist for more information on knee rehabilitation.    CONSTIPATION  Constipation is defined medically as fewer than three stools per week and severe constipation as less than one stool per week.  Even if you have a regular bowel pattern at home, your normal regimen is likely to be disrupted due to multiple reasons following surgery.  Combination of anesthesia, postoperative narcotics, change in appetite and fluid intake all can affect your bowels.   YOU MUST use at least one of the following options; they are  listed in order of increasing strength to get the job done.  They are all available over the counter, and you may need to use some, POSSIBLY even all of these options:    Drink plenty of fluids (prune juice may be helpful) and high fiber foods Colace 100 mg by mouth twice a day  Senokot for constipation as directed and as needed Dulcolax (bisacodyl), take with full glass of water  Miralax (polyethylene glycol) once or twice a day as needed.  If you have tried all these things and are unable to have a bowel movement in the first 3-4 days after surgery call either your surgeon or your primary doctor.    If you experience loose stools or diarrhea, hold the medications until you stool forms back up.  If your symptoms do not get better  within 1 week or if they get worse, check with your doctor.  If you experience "the worst abdominal pain ever" or develop nausea or vomiting, please contact the office immediately for further recommendations for treatment.   ITCHING:  If you experience itching with your medications, try taking only a single pain pill, or even half a pain pill at a time.  You can also use Benadryl over the counter for itching or also to help with sleep.   TED HOSE STOCKINGS:  Use stockings on both legs until for at least 2 weeks or as directed by physician office. They may be removed at night for sleeping.  MEDICATIONS:  See your medication summary on the "After Visit Summary" that nursing will review with you.  You may have some home medications which will be placed on hold until you complete the course of blood thinner medication.  It is important for you to complete the blood thinner medication as prescribed.  PRECAUTIONS:  If you experience chest pain or shortness of breath - call 911 immediately for transfer to the hospital emergency department.   If you develop a fever greater that 101 F, purulent drainage from wound, increased redness or drainage from wound, foul odor from the  wound/dressing, or calf pain - CONTACT YOUR SURGEON.                                                   FOLLOW-UP APPOINTMENTS:  If you do not already have a post-op appointment, please call the office for an appointment to be seen by your surgeon.  Guidelines for how soon to be seen are listed in your "After Visit Summary", but are typically between 1-4 weeks after surgery.  OTHER INSTRUCTIONS:   Knee Replacement:  Do not place pillow under knee, focus on keeping the knee straight while resting. CPM instructions: 0-90 degrees, 2 hours in the morning, 2 hours in the afternoon, and 2 hours in the evening. Place foam block, curve side up under heel at all times except when in CPM or when walking.  DO NOT modify, tear, cut, or change the foam block in any way.  MAKE SURE YOU:  . Understand these instructions.  . Get help right away if you are not doing well or get worse.    Thank you for letting us be a part of your medical care team.  It is a privilege we respect greatly.  We hope these instructions will help you stay on track for a fast and full recovery!   

## 2017-04-13 NOTE — Progress Notes (Signed)
Orthopedic Tech Progress Note Patient Details:  Angelica French [birth year] [id number]  CPM Left Knee CPM Left Knee: On Left Knee Flexion (Degrees): 60 Left Knee Extension (Degrees): 0     [name] 04/13/2017, 5:40 PM

## 2017-04-13 NOTE — Interval H&P Note (Signed)
History and Physical Interval Note:  04/13/2017 1:15 PM  Angelica French  has presented today for surgery, with the diagnosis of bilateral knee osteoarthritis  The various methods of treatment have been discussed with the patient and family. After consideration of risks, benefits and other options for treatment, the patient has consented to  Procedure(s): LEFT TOTAL KNEE ARTHROPLASTY AND RIGHT KNEE INTRA-ARTICULAR STEROID INJECTION (Left) as a surgical intervention .  The patient's history has been reviewed, patient examined, no change in status, stable for surgery.  I have reviewed the patient's chart and labs.  Questions were answered to the patient's satisfaction.     [name]

## 2017-04-14 ENCOUNTER — Other Ambulatory Visit: Payer: Self-pay

## 2017-04-14 LAB — BASIC METABOLIC PANEL
ANION GAP: 9 (ref 5–15)
BUN: 40 mg/dL — AB (ref 6–20)
CALCIUM: 8.2 mg/dL — AB (ref 8.9–10.3)
CO2: 20 mmol/L — ABNORMAL LOW (ref 22–32)
Chloride: 105 mmol/L (ref 101–111)
Creatinine, Ser: 1.3 mg/dL — ABNORMAL HIGH (ref 0.44–1.00)
GFR calc Af Amer: 45 mL/min — ABNORMAL LOW (ref >60)
GFR calc non Af Amer: 39 mL/min — ABNORMAL LOW (ref >60)
Glucose, Bld: 240 mg/dL — ABNORMAL HIGH (ref 65–99)
POTASSIUM: 4 mmol/L (ref 3.5–5.1)
SODIUM: 134 mmol/L — AB (ref 135–145)

## 2017-04-14 LAB — CBC
HCT: 33.2 % — ABNORMAL LOW (ref 36.0–46.0)
Hemoglobin: 10.8 g/dL — ABNORMAL LOW (ref 12.0–15.0)
MCH: 26.1 pg (ref 26.0–34.0)
MCHC: 32.5 g/dL (ref 30.0–36.0)
MCV: 80.2 fL (ref 78.0–100.0)
PLATELETS: 164 10*3/uL (ref 150–400)
RBC: 4.14 MIL/uL (ref 3.87–5.11)
RDW: 15.1 % (ref 11.5–15.5)
WBC: 9.8 10*3/uL (ref 4.0–10.5)

## 2017-04-14 LAB — GLUCOSE, CAPILLARY
GLUCOSE-CAPILLARY: 307 mg/dL — AB (ref 65–99)
Glucose-Capillary: 218 mg/dL — ABNORMAL HIGH (ref 65–99)
Glucose-Capillary: 255 mg/dL — ABNORMAL HIGH (ref 65–99)
Glucose-Capillary: 263 mg/dL — ABNORMAL HIGH (ref 65–99)

## 2017-04-14 NOTE — Progress Notes (Signed)
Physical Therapy Treatment Patient Details Name: Angelica French MRN: [id number] DOB: May 29, 1940 Today's Date: 04/14/2017    History of Present Illness 77 y.o. female s/p L TKA and R knee steroid injection 04/13/17. PMH includes: CHF, CKD, DM, HTN, Stoma, back surgery.     PT Comments    Patient seen again to assist to bedside commode. Patient able to walk further distance this visit, with less dependence. Min guard for 30', reviewed therex. Pt happy with progression from morning session, SNF recs still appropriate.    Follow Up Recommendations  Follow surgeon's recommendation for DC plan and follow-up therapies;SNF     Equipment Recommendations  (TBD at next venue )    Recommendations for Other Services       Precautions / Restrictions Precautions Precautions: Fall;Knee Precaution Booklet Issued: Yes (comment) Precaution Comments: reviewed supine therex and no pillow under knee Required Braces or Orthoses: Knee Immobilizer - Left Restrictions Weight Bearing Restrictions: Yes    Mobility  Bed Mobility Overal bed mobility: Needs Assistance Bed Mobility: Supine to Sit     Supine to sit: Min guard        Transfers Overall transfer level: Needs assistance Equipment used: Rolling walker (2 wheeled) Transfers: Sit to/from Stand Sit to Stand: Min assist         General transfer comment: Min A to power up   Ambulation/Gait Ambulation/Gait assistance: Min guard Ambulation Distance (Feet): 30 Feet Assistive device: Rolling walker (2 wheeled) Gait Pattern/deviations: Step-to pattern;Antalgic Gait velocity: decreased   General Gait Details: Min Guard for safety this session, ambulating twice the distance and returned to bedside commode with imporved independence.    Stairs             Wheelchair Mobility    Modified Rankin (Stroke Patients Only)       Balance Overall balance assessment: Needs assistance Sitting-balance support: Feet  unsupported;Bilateral upper extremity supported Sitting balance-Leahy Scale: Good     Standing balance support: During functional activity;Bilateral upper extremity supported Standing balance-Leahy Scale: Poor                              Cognition Arousal/Alertness: Awake/alert Behavior During Therapy: WFL for tasks assessed/performed Overall Cognitive Status: Within Functional Limits for tasks assessed                                        Exercises Total Joint Exercises Ankle Circles/Pumps: 20 reps Quad Sets: 10 reps Heel Slides: 10 reps Hip ABduction/ADduction: 10 reps Straight Leg Raises: 10 reps Long Arc Quad: 10 reps    General Comments        Pertinent Vitals/Pain Pain Assessment: Faces Faces Pain Scale: Hurts even more Pain Location: L knee Pain Descriptors / Indicators: Discomfort;Aching;Operative site guarding Pain Intervention(s): Limited activity within patient's tolerance    Home Living                      Prior Function            PT Goals (current goals can now be found in the care plan section) Acute Rehab PT Goals Patient Stated Goal: rehab PT Goal Formulation: With patient Time For Goal Achievement: 04/22/17 Potential to Achieve Goals: Good    Frequency    7X/week      PT Plan Current plan remains  appropriate    Co-evaluation              AM-PAC PT "6 Clicks" Daily Activity  Outcome Measure  Difficulty turning over in bed (including adjusting bedclothes, sheets and blankets)?: Unable Difficulty moving from lying on back to sitting on the side of the bed? : Unable Difficulty sitting down on and standing up from a chair with arms (e.g., wheelchair, bedside commode, etc,.)?: Unable Help needed moving to and from a bed to chair (including a wheelchair)?: A Lot Help needed walking in hospital room?: A Lot Help needed climbing 3-5 steps with a railing? : Total 6 Click Score: 8    End of  Session Equipment Utilized During Treatment: Gait belt Activity Tolerance: Patient tolerated treatment well Patient left: in bed;with call bell/phone within reach;with nursing/sitter in room Nurse Communication: Mobility status PT Visit Diagnosis: Unsteadiness on feet (R26.81);Other abnormalities of gait and mobility (R26.89);Repeated falls (R29.6);Muscle weakness (generalized) (M62.81)     Time: 7048-8891 PT Time Calculation (min) (ACUTE ONLY): 17 min  Charges:  $Gait Training: 8-22 mins                    G Codes:       Reinaldo Berber, PT, DPT Acute Rehab Services Pager: [phone]     Reinaldo Berber 04/14/2017, 3:22 PM

## 2017-04-14 NOTE — Progress Notes (Signed)
Patient ID: Angelica French, female   DOB: 1940/04/30, 77 y.o.   MRN: [id number] Patient is postoperative day 1 left TKA she states she cannot sit up on the side of the bed due to weakness.  Plan for discharge to skilled nursing.  She has no pain.

## 2017-04-14 NOTE — Plan of Care (Signed)
  Problem: Nutrition: Goal: Adequate nutrition will be maintained Outcome: Progressing   Problem: Elimination: Goal: Will not experience complications related to bowel motility Outcome: Progressing   Problem: Skin Integrity: Goal: Risk for impaired skin integrity will decrease Outcome: Progressing   Problem: Activity: Goal: Ability to avoid complications of mobility impairment will improve Outcome: Progressing

## 2017-04-14 NOTE — Evaluation (Signed)
Physical Therapy Evaluation Patient Details Name: Angelica French MRN: [id number] DOB: June 21, 1940 Today's Date: 04/14/2017   History of Present Illness  77 y.o. female s/p L TKA and R knee steroid injection 04/13/17. PMH includes: CHF, CKD, DM, HTN, Stoma, back surgery.     Clinical Impression  Patient is s/p above surgery resulting in functional limitations due to the deficits listed below (see PT Problem List). PTA, pt living with husband independent with mobility. Upon eval pt presents with post op pain and weakness, mod A for bed mobility and able to ambulate short distances in hospital room with Min A for stabilization.  Patient will benefit from skilled PT to increase their independence and safety with mobility to allow discharge to the venue listed below.       Follow Up Recommendations Follow surgeon's recommendation for DC plan and follow-up therapies;SNF    Equipment Recommendations  (TBD at next venue)    Recommendations for Other Services       Precautions / Restrictions Precautions Precautions: Fall;Knee Precaution Booklet Issued: Yes (comment) Precaution Comments: reviewed supine therex and no pillow under knee Required Braces or Orthoses: Knee Immobilizer - Left Restrictions Weight Bearing Restrictions: Yes      Mobility  Bed Mobility Overal bed mobility: Needs Assistance Bed Mobility: Supine to Sit     Supine to sit: Min guard        Transfers Overall transfer level: Needs assistance Equipment used: Rolling walker (2 wheeled) Transfers: Sit to/from Stand Sit to Stand: Mod assist         General transfer comment: Mod A to assist in standing, cues for hand placement and safety with RW  Ambulation/Gait Ambulation/Gait assistance: Min assist Ambulation Distance (Feet): 15 Feet Assistive device: Rolling walker (2 wheeled) Gait Pattern/deviations: Step-to pattern;Antalgic Gait velocity: decrased   General Gait Details: Min A to stabilize during  walking, short distance in hospital room. pt fatigues quickly.   Stairs            Wheelchair Mobility    Modified Rankin (Stroke Patients Only)       Balance Overall balance assessment: Needs assistance Sitting-balance support: Feet unsupported;Bilateral upper extremity supported Sitting balance-Leahy Scale: Good     Standing balance support: During functional activity;Bilateral upper extremity supported Standing balance-Leahy Scale: Poor                               Pertinent Vitals/Pain Pain Assessment: Faces Faces Pain Scale: Hurts even more Pain Location: L knee Pain Descriptors / Indicators: Discomfort;Aching;Operative site guarding Pain Intervention(s): Limited activity within patient's tolerance;Monitored during session;Premedicated before session;Repositioned    Home Living Family/patient expects to be discharged to:: Skilled nursing facility Living Arrangements: Spouse/significant other                    Prior Function Level of Independence: Independent               Hand Dominance        Extremity/Trunk Assessment   Upper Extremity Assessment Upper Extremity Assessment: Overall WFL for tasks assessed    Lower Extremity Assessment Lower Extremity Assessment: (RLE 4/5 strength, LLE post op pain TKA)       Communication   Communication: No difficulties  Cognition Arousal/Alertness: Awake/alert Behavior During Therapy: WFL for tasks assessed/performed Overall Cognitive Status: Within Functional Limits for tasks assessed  General Comments      Exercises Total Joint Exercises Ankle Circles/Pumps: 20 reps   Assessment/Plan    PT Assessment Patient needs continued PT services  PT Problem List Decreased strength;Decreased range of motion;Decreased activity tolerance;Decreased balance;Decreased mobility;Decreased cognition       PT Treatment Interventions  DME instruction;Gait training;Stair training;Functional mobility training;Therapeutic activities;Therapeutic exercise    PT Goals (Current goals can be found in the Care Plan section)  Acute Rehab PT Goals Patient Stated Goal: rehab PT Goal Formulation: With patient Time For Goal Achievement: 04/22/17 Potential to Achieve Goals: Good    Frequency 7X/week   Barriers to discharge        Co-evaluation               AM-PAC PT "6 Clicks" Daily Activity  Outcome Measure Difficulty turning over in bed (including adjusting bedclothes, sheets and blankets)?: Unable Difficulty moving from lying on back to sitting on the side of the bed? : Unable Difficulty sitting down on and standing up from a chair with arms (e.g., wheelchair, bedside commode, etc,.)?: Unable Help needed moving to and from a bed to chair (including a wheelchair)?: A Lot Help needed walking in hospital room?: A Lot Help needed climbing 3-5 steps with a railing? : Total 6 Click Score: 8    End of Session Equipment Utilized During Treatment: Gait belt Activity Tolerance: Patient tolerated treatment well Patient left: in bed;with call bell/phone within reach;with nursing/sitter in room Nurse Communication: Mobility status PT Visit Diagnosis: Unsteadiness on feet (R26.81);Other abnormalities of gait and mobility (R26.89);Repeated falls (R29.6);Muscle weakness (generalized) (M62.81)    Time: 2010-0712 PT Time Calculation (min) (ACUTE ONLY): 20 min   Charges:   PT Evaluation $PT Eval Low Complexity: 1 Low PT Treatments $Gait Training: 8-22 mins   PT G Codes:        Reinaldo Berber, PT, DPT Acute Rehab Services Pager: [phone]    Reinaldo Berber 04/14/2017, 10:35 AM

## 2017-04-15 LAB — CBC
HEMATOCRIT: 29.7 % — AB (ref 36.0–46.0)
HEMOGLOBIN: 9.6 g/dL — AB (ref 12.0–15.0)
MCH: 26.2 pg (ref 26.0–34.0)
MCHC: 32.3 g/dL (ref 30.0–36.0)
MCV: 80.9 fL (ref 78.0–100.0)
Platelets: 173 10*3/uL (ref 150–400)
RBC: 3.67 MIL/uL — ABNORMAL LOW (ref 3.87–5.11)
RDW: 15.3 % (ref 11.5–15.5)
WBC: 13.1 10*3/uL — AB (ref 4.0–10.5)

## 2017-04-15 LAB — GLUCOSE, CAPILLARY
GLUCOSE-CAPILLARY: 227 mg/dL — AB (ref 65–99)
GLUCOSE-CAPILLARY: 254 mg/dL — AB (ref 65–99)
GLUCOSE-CAPILLARY: 215 mg/dL — AB (ref 65–99)
GLUCOSE-CAPILLARY: 273 mg/dL — AB (ref 65–99)
Glucose-Capillary: 297 mg/dL — ABNORMAL HIGH (ref 65–99)

## 2017-04-15 NOTE — Plan of Care (Signed)
  Problem: Nutrition: Goal: Adequate nutrition will be maintained Outcome: Progressing   Problem: Elimination: Goal: Will not experience complications related to bowel motility Outcome: Progressing   Problem: Skin Integrity: Goal: Risk for impaired skin integrity will decrease Outcome: Progressing   Problem: Activity: Goal: Ability to avoid complications of mobility impairment will improve Outcome: Progressing

## 2017-04-15 NOTE — Progress Notes (Signed)
Patient ID: Angelica French, female   DOB: 03-11-1940, 77 y.o.   MRN: [id number] Patient is postoperative day 2 total knee arthroplasty.  Plan for discharge to skilled nursing Monday or Tuesday.  No complaints this morning.

## 2017-04-15 NOTE — Care Management Note (Signed)
Case Management Note  Patient Details  Name: Angelica French MRN: [id number] Date of Birth: Sep 28, 1940  Subjective/Objective:                 Spoke to patient at the bedside. She states that she will go to SNF at DC. She states she has already done the paperwork prior to admission to go to [facility]. CM will place CSW consult to facilitate DC to SNF.    Action/Plan:   Expected Discharge Date:                  Expected Discharge Plan:  Skilled Nursing Facility  In-House Referral:  Clinical Social Work  Discharge planning Services  CM Consult  Post Acute Care Choice:    Choice offered to:  Patient  DME Arranged:    DME Agency:     HH Arranged:    [place] Agency:     Status of Service:  Completed, signed off  If discussed at [employer] [facility] [employer], dates discussed:    Additional Comments:  Carles Collet, RN 04/15/2017, 9:30 AM

## 2017-04-15 NOTE — Progress Notes (Signed)
Physical Therapy Treatment Patient Details Name: Angelica French MRN: [id number] DOB: 1940/04/14 Today's Date: 04/15/2017    History of Present Illness 77 y.o. female s/p L TKA and R knee steroid injection 04/13/17. PMH includes: CHF, CKD, DM, HTN, Stoma, back surgery.     PT Comments    Pt demonstrates improved tolerance for gait this session with the ability to ambulate from bed to bathroom. Pt requires cues for proper sequencing and for knee flexion during swing phase. Pt has very little complaints of pain this session and pt continues to plan for SNF at discharge for short term rehab.     Follow Up Recommendations  Follow surgeon's recommendation for DC plan and follow-up therapies;SNF     Equipment Recommendations  Other (comment)(defer to next venue)    Recommendations for Other Services       Precautions / Restrictions Precautions Precautions: Fall;Knee Precaution Booklet Issued: Yes (comment) Precaution Comments: reviewed supine therex and no pillow under knee Required Braces or Orthoses: Knee Immobilizer - Left Restrictions Weight Bearing Restrictions: Yes LLE Weight Bearing: Weight bearing as tolerated    Mobility  Bed Mobility Overal bed mobility: Needs Assistance Bed Mobility: Supine to Sit     Supine to sit: Min guard        Transfers Overall transfer level: Needs assistance Equipment used: Rolling walker (2 wheeled) Transfers: Sit to/from Stand Sit to Stand: Min assist         General transfer comment: Min A for safety to power up and cues for proper hand placement.   Ambulation/Gait Ambulation/Gait assistance: Min guard   Assistive device: Rolling walker (2 wheeled) Gait Pattern/deviations: Step-to pattern;Antalgic Gait velocity: decreased   General Gait Details: Min Guard for safety this session, ambulating twice the distance and returned to bedside commode with imporved independence.    Stairs             Wheelchair Mobility     Modified Rankin (Stroke Patients Only)       Balance Overall balance assessment: Needs assistance Sitting-balance support: Feet unsupported;Single extremity supported Sitting balance-Leahy Scale: Good     Standing balance support: During functional activity;Bilateral upper extremity supported Standing balance-Leahy Scale: Fair Standing balance comment: able to stand briefly at sink to wash hands.                             Cognition Arousal/Alertness: Awake/alert Behavior During Therapy: WFL for tasks assessed/performed Overall Cognitive Status: Within Functional Limits for tasks assessed                                        Exercises Total Joint Exercises Ankle Circles/Pumps: 20 reps;AROM;Both Quad Sets: AROM;Left;10 reps    General Comments        Pertinent Vitals/Pain Pain Assessment: 0-10 Pain Score: 4  Pain Location: L knee Pain Descriptors / Indicators: Discomfort;Aching;Operative site guarding Pain Intervention(s): Monitored during session;Premedicated before session;Repositioned;Ice applied    Home Living                      Prior Function            PT Goals (current goals can now be found in the care plan section) Acute Rehab PT Goals Patient Stated Goal: rehab Progress towards PT goals: Progressing toward goals    Frequency  7X/week      PT Plan Current plan remains appropriate    Co-evaluation              AM-PAC PT "6 Clicks" Daily Activity  Outcome Measure  Difficulty turning over in bed (including adjusting bedclothes, sheets and blankets)?: Unable Difficulty moving from lying on back to sitting on the side of the bed? : Unable Difficulty sitting down on and standing up from a chair with arms (e.g., wheelchair, bedside commode, etc,.)?: Unable Help needed moving to and from a bed to chair (including a wheelchair)?: A Little Help needed walking in hospital room?: A Little Help needed  climbing 3-5 steps with a railing? : A Lot 6 Click Score: 11    End of Session Equipment Utilized During Treatment: Gait belt;Left knee immobilizer       PT Visit Diagnosis: Unsteadiness on feet (R26.81);Other abnormalities of gait and mobility (R26.89);Repeated falls (R29.6);Muscle weakness (generalized) (M62.81)     Time: 3295-1884 PT Time Calculation (min) (ACUTE ONLY): 25 min  Charges:  $Gait Training: 8-22 mins $Therapeutic Exercise: 8-22 mins                    G Codes:           Joshue Badal Sloan Leiter 04/15/2017, 1:32 PM

## 2017-04-16 ENCOUNTER — Encounter (HOSPITAL_COMMUNITY): Payer: Self-pay | Admitting: Specialist

## 2017-04-16 ENCOUNTER — Inpatient Hospital Stay (HOSPITAL_COMMUNITY): Payer: Medicare Other

## 2017-04-16 LAB — CBC
HCT: 30.6 % — ABNORMAL LOW (ref 36.0–46.0)
Hemoglobin: 9.7 g/dL — ABNORMAL LOW (ref 12.0–15.0)
MCH: 26.4 pg (ref 26.0–34.0)
MCHC: 31.7 g/dL (ref 30.0–36.0)
MCV: 83.2 fL (ref 78.0–100.0)
PLATELETS: 169 10*3/uL (ref 150–400)
RBC: 3.68 MIL/uL — ABNORMAL LOW (ref 3.87–5.11)
RDW: 16.1 % — AB (ref 11.5–15.5)
WBC: 11.1 10*3/uL — ABNORMAL HIGH (ref 4.0–10.5)

## 2017-04-16 LAB — GLUCOSE, CAPILLARY
GLUCOSE-CAPILLARY: 219 mg/dL — AB (ref 65–99)
GLUCOSE-CAPILLARY: 236 mg/dL — AB (ref 65–99)
Glucose-Capillary: 213 mg/dL — ABNORMAL HIGH (ref 65–99)
Glucose-Capillary: 247 mg/dL — ABNORMAL HIGH (ref 65–99)
Glucose-Capillary: 219 mg/dL — ABNORMAL HIGH (ref 65–99)
Glucose-Capillary: 162 mg/dL — ABNORMAL HIGH (ref 65–99)

## 2017-04-16 MED ORDER — ACETAMINOPHEN 325 MG PO TABS: 325.0000 mg | ORAL_TABLET | Freq: Four times a day (QID) | ORAL | 1 refills | Status: DC | PRN

## 2017-04-16 MED ORDER — DOCUSATE SODIUM 100 MG PO CAPS: 100.0000 mg | ORAL_CAPSULE | Freq: Two times a day (BID) | ORAL | 0 refills | Status: DC

## 2017-04-16 MED ORDER — DM-GUAIFENESIN ER 30-600 MG PO TB12
1.0000 | ORAL_TABLET | Freq: Two times a day (BID) | ORAL | Status: DC
  Administered 2017-04-16 – 2017-04-17 (×3): 1 via ORAL
  Filled 2017-04-16 (×4): qty 1

## 2017-04-16 MED ORDER — HYDROCODONE-ACETAMINOPHEN 5-325 MG PO TABS: 1.0000 | ORAL_TABLET | ORAL | 0 refills | Status: DC | PRN

## 2017-04-16 MED ORDER — ASPIRIN 325 MG PO TBEC: 325.0000 mg | DELAYED_RELEASE_TABLET | Freq: Every day | ORAL | 0 refills | Status: DC

## 2017-04-16 MED ORDER — GABAPENTIN 300 MG PO CAPS: 300.0000 mg | ORAL_CAPSULE | Freq: Three times a day (TID) | ORAL | 1 refills | Status: DC

## 2017-04-16 MED ORDER — DM-GUAIFENESIN ER 30-600 MG PO TB12: 1.0000 | ORAL_TABLET | Freq: Two times a day (BID) | ORAL | 1 refills | Status: DC

## 2017-04-16 MED ORDER — LORATADINE 10 MG PO TABS
10.0000 mg | ORAL_TABLET | Freq: Every day | ORAL | Status: DC
  Administered 2017-04-16 – 2017-04-17 (×2): 10 mg via ORAL
  Filled 2017-04-16 (×2): qty 1

## 2017-04-16 MED ORDER — HYDROCHLOROTHIAZIDE 25 MG PO TABS
12.5000 mg | ORAL_TABLET | Freq: Every day | ORAL | Status: DC
  Administered 2017-04-17: 12.5 mg via ORAL
  Filled 2017-04-16: qty 1

## 2017-04-16 MED ORDER — DIPHENHYDRAMINE HCL 12.5 MG/5ML PO ELIX: 12.5000 mg | ORAL_SOLUTION | ORAL | 0 refills | Status: DC | PRN

## 2017-04-16 MED ORDER — LORATADINE 10 MG PO TABS: 10.0000 mg | ORAL_TABLET | Freq: Every day | ORAL | 1 refills | Status: DC

## 2017-04-16 NOTE — Progress Notes (Signed)
Physical Therapy Treatment Patient Details Name: Angelica French MRN: [id number] DOB: 09-11-1940 Today's Date: 04/16/2017    History of Present Illness 77 y.o. female s/p L TKA and R knee steroid injection 04/13/17. PMH includes: CHF, CKD, DM, HTN, Stoma, back surgery.     PT Comments    Pt performed increased gait but remains to require min assistance for transfers and gait training.  Guided patient through LE exercises to L knee.  Pt required increased time and continues to benefit from SNF placement at d/c to improve strength and function before returning home.  Plan next session for continued gait and exercises.      Follow Up Recommendations  Follow surgeon's recommendation for DC plan and follow-up therapies;SNF     Equipment Recommendations  Other (comment)(defer to next.  )    Recommendations for Other Services       Precautions / Restrictions Precautions Precautions: Fall;Knee Precaution Booklet Issued: Yes (comment) Precaution Comments: reviewed supine therex and no pillow under knee Required Braces or Orthoses: Knee Immobilizer - Left Restrictions Weight Bearing Restrictions: Yes LLE Weight Bearing: Weight bearing as tolerated    Mobility  Bed Mobility Overal bed mobility: Needs Assistance Bed Mobility: Supine to Sit     Supine to sit: Min guard     General bed mobility comments: Pt required increased time and effort but able to advance B LEs to edge of bed and achieve sitting with min guard for trunk control.    Transfers Overall transfer level: Needs assistance Equipment used: Rolling walker (2 wheeled) Transfers: Sit to/from [facility] Sit to Stand: Min assist Stand pivot transfers: Min assist(no device used for stand pivot from bed to [facility].  )       General transfer comment: Min A for safety to power up and cues for proper hand placement.   Ambulation/Gait Ambulation/Gait assistance: Min guard Ambulation Distance (Feet): 60  Feet Assistive device: Rolling walker (2 wheeled) Gait Pattern/deviations: Step-to pattern;Antalgic;Trunk flexed;Decreased stride length;Decreased step length - right;Step-through pattern Gait velocity: decreased   General Gait Details: Cues for progression to step through gait training.  Cues for gait symmetry.     Stairs             Wheelchair Mobility    Modified Rankin (Stroke Patients Only)       Balance Overall balance assessment: Needs assistance Sitting-balance support: Feet unsupported;Single extremity supported Sitting balance-Leahy Scale: Good     Standing balance support: During functional activity;Bilateral upper extremity supported Standing balance-Leahy Scale: Fair                              Cognition Arousal/Alertness: Awake/alert Behavior During Therapy: WFL for tasks assessed/performed Overall Cognitive Status: Within Functional Limits for tasks assessed                                        Exercises Total Joint Exercises Ankle Circles/Pumps: 20 reps;AROM;Both;Supine Quad Sets: AROM;Left;10 reps;Supine Heel Slides: AROM;Left;10 reps;Supine Hip ABduction/ADduction: 10 reps;AROM;Left;Supine Straight Leg Raises: AROM;Left;10 reps;Supine Goniometric ROM: 0-80 degrees flexion.      General Comments        Pertinent Vitals/Pain Pain Assessment: 0-10 Pain Score: 4  Pain Location: L knee Pain Descriptors / Indicators: Discomfort;Aching;Operative site guarding Pain Intervention(s): Monitored during session;Repositioned;Ice applied    Home Living  Prior Function            PT Goals (current goals can now be found in the care plan section) Acute Rehab PT Goals Patient Stated Goal: rehab Potential to Achieve Goals: Good Progress towards PT goals: Progressing toward goals    Frequency    7X/week      PT Plan Current plan remains appropriate    Co-evaluation               AM-PAC PT "6 Clicks" Daily Activity  Outcome Measure  Difficulty turning over in bed (including adjusting bedclothes, sheets and blankets)?: Unable Difficulty moving from lying on back to sitting on the side of the bed? : Unable Difficulty sitting down on and standing up from a chair with arms (e.g., wheelchair, bedside commode, etc,.)?: Unable Help needed moving to and from a bed to chair (including a wheelchair)?: A Little Help needed walking in hospital room?: A Little Help needed climbing 3-5 steps with a railing? : A Lot 6 Click Score: 11    End of Session Equipment Utilized During Treatment: Gait belt;Left knee immobilizer Activity Tolerance: Patient tolerated treatment well Patient left: in bed;with call bell/phone within reach;with nursing/sitter in room Nurse Communication: Mobility status PT Visit Diagnosis: Unsteadiness on feet (R26.81);Other abnormalities of gait and mobility (R26.89);Repeated falls (R29.6);Muscle weakness (generalized) (M62.81)     Time: 2263-3354 PT Time Calculation (min) (ACUTE ONLY): 28 min  Charges:  $Gait Training: 8-22 mins $Therapeutic Exercise: 8-22 mins                    G Codes:       Governor Rooks, PTA pager [place] 04/16/2017, 2:42 PM

## 2017-04-16 NOTE — Discharge Summary (Addendum)
Physician Discharge Summary      Patient ID: [name] MRN: [id number] DOB/AGE: February 13, 1940 78 y.o.  Admit date: 04/13/2017 Discharge date:04/17/2017   Admission Diagnoses:  Principal Problem:   Bilateral primary osteoarthritis of knee Active Problems:   Status post left knee replacement   Discharge Diagnoses:  Same  Past Medical History:  Diagnosis Date  . Arthritis   . Bleeding ulcer   . CHF (congestive heart failure) ([place])   . Chronic kidney disease    Renal artery stenosis - right. Left kindey ok- sees Dr Neta Ehlers- nepjrologist  . Diabetes mellitus without complication ([place])    type II  . Gastric ulcer 1994   bleeding  . History of blood transfusion    1970- illeostomy 2004-  . Hypertension   . UC (ulcerative colitis) ([place])   . VRE (vancomycin resistant enterococcus) culture positive 2004    Surgeries: Procedure(s): LEFT TOTAL KNEE ARTHROPLASTY AND RIGHT KNEE INTRA-ARTICULAR STEROID INJECTION on 04/13/2017   Consultants:   Discharged Condition: Improved  Hospital Course: Angelica French is an 76 y.o. female who was admitted 04/13/2017 with a chief complaint of No chief complaint on file. , and found to have a diagnosis of Bilateral primary osteoarthritis of knee.  She was brought to the operating room on 04/13/2017 and underwent the above named procedures.    She was given perioperative antibiotics:  Anti-infectives (From admission, onward)   Start     Dose/Rate Route Frequency Ordered Stop   04/13/17 1055  ceFAZolin (ANCEF) IVPB 2g/100 mL premix     2 g 200 mL/hr over 30 Minutes Intravenous On call to O.R. 04/13/17 1055 04/13/17 1350    Post surgery she recovered in the PACU uneventfully and was transferred to [place] [facility]. POD#1 awake, alert and oriented. She was started on CPM of the left knee in the PACU and continued 8 hours per day while in the hospital. Received TXA intraoperatively. Her hgb returned at 10 . Her VSS. Dressing remained dry. PT and OT  inititiated and she was slow with PT indicating that she did not have the strength to sit up well. Pain in the left TKR was mild.POD#2 Remained afebrile, VSS Hgb with decrease to 9.1. This later rebounded to 9.7. PT and OT continued, Social service consult as she was slow with PT/OT, this was Anticipated preop and she had chosen [facility] For post hospital stay for extended rehabilitation post left  TKR. POD#3 she was awake and alert. Developed a productive cough, clear sputum. PCXR was negative for  Infiltrate, minimally enlarged cardiac shadow. Afebrile vss. CBC with out elevated WBC. Findings consistent with Viral URI. Started on mucinex and claritin. Social service consuttation. FL2 form signed. Incision examined showed  Scant 63mm area of dry blood, Ag Cell Dressing left intact and 6 inch ACE reapplied to assist with post op swelling.  Her renal function preop 1.5 Creatine was 1.3 post op. She  Has a history of a single kidney and NSAIDS should be minimized. PT and OT continued and she was ambulated with a walker short distance.  POD#4 awake alert and oriented x 4. Dressing left knee is intact and no significant  Drainage, small area 41mm round unchanged dry blood.Leave this dressing intact for next 2 weeks. No coughing since starting claritin generic and mucinex. Lasix d/ced and will start amaryl today. For SNF today. Arrangements to be made for transfer.   She was given sequential compression devices, early ambulation, and chemoprophylaxis  for DVT prophylaxis.  She benefited maximally from their hospital stay and there were no complications.    Recent vital signs:  Vitals:   04/17/17 0530 04/17/17 1100  BP: (!) 95/58 (!) 115/51  Pulse: (!) 51 65  Resp:    Temp: 98.3 F (36.8 C)   SpO2: 99%     Recent laboratory studies:  Results for orders placed or performed during the hospital encounter of 04/13/17  Glucose, capillary  Result Value Ref Range   Glucose-Capillary 157 (H) 65  - 99 mg/dL   Comment 1 Notify RN    Comment 2 Document in Chart   Glucose, capillary  Result Value Ref Range   Glucose-Capillary 103 (H) 65 - 99 mg/dL  Glucose, capillary  Result Value Ref Range   Glucose-Capillary 133 (H) 65 - 99 mg/dL  CBC  Result Value Ref Range   WBC 9.8 4.0 - 10.5 K/uL   RBC 4.14 3.87 - 5.11 MIL/uL   Hemoglobin 10.8 (L) 12.0 - 15.0 g/dL   HCT 33.2 (L) 36.0 - 46.0 %   MCV 80.2 78.0 - 100.0 fL   MCH 26.1 26.0 - 34.0 pg   MCHC 32.5 30.0 - 36.0 g/dL   RDW 15.1 11.5 - 15.5 %   Platelets 164 150 - 400 K/uL  Basic metabolic panel  Result Value Ref Range   Sodium 134 (L) 135 - 145 mmol/L   Potassium 4.0 3.5 - 5.1 mmol/L   Chloride 105 101 - 111 mmol/L   CO2 20 (L) 22 - 32 mmol/L   Glucose, Bld 240 (H) 65 - 99 mg/dL   BUN 40 (H) 6 - 20 mg/dL   Creatinine, Ser 1.30 (H) 0.44 - 1.00 mg/dL   Calcium 8.2 (L) 8.9 - 10.3 mg/dL   GFR calc non Af Amer 39 (L) >60 mL/min   GFR calc Af Amer 45 (L) >60 mL/min   Anion gap 9 5 - 15  Hemoglobin A1c  Result Value Ref Range   Hgb A1c MFr Bld 7.1 (H) 4.8 - 5.6 %   Mean Plasma Glucose 157.07 mg/dL  Glucose, capillary  Result Value Ref Range   Glucose-Capillary 194 (H) 65 - 99 mg/dL  Glucose, capillary  Result Value Ref Range   Glucose-Capillary 218 (H) 65 - 99 mg/dL  Glucose, capillary  Result Value Ref Range   Glucose-Capillary 255 (H) 65 - 99 mg/dL  Glucose, capillary  Result Value Ref Range   Glucose-Capillary 307 (H) 65 - 99 mg/dL  CBC  Result Value Ref Range   WBC 13.1 (H) 4.0 - 10.5 K/uL   RBC 3.67 (L) 3.87 - 5.11 MIL/uL   Hemoglobin 9.6 (L) 12.0 - 15.0 g/dL   HCT 29.7 (L) 36.0 - 46.0 %   MCV 80.9 78.0 - 100.0 fL   MCH 26.2 26.0 - 34.0 pg   MCHC 32.3 30.0 - 36.0 g/dL   RDW 15.3 11.5 - 15.5 %   Platelets 173 150 - 400 K/uL  Glucose, capillary  Result Value Ref Range   Glucose-Capillary 263 (H) 65 - 99 mg/dL  Glucose, capillary  Result Value Ref Range   Glucose-Capillary 297 (H) 65 - 99 mg/dL  Glucose,  capillary  Result Value Ref Range   Glucose-Capillary 227 (H) 65 - 99 mg/dL  Glucose, capillary  Result Value Ref Range   Glucose-Capillary 254 (H) 65 - 99 mg/dL  Glucose, capillary  Result Value Ref Range   Glucose-Capillary 215 (H) 65 - 99 mg/dL  CBC  Result Value Ref Range   WBC 11.1 (H) 4.0 - 10.5 K/uL   RBC 3.68 (L) 3.87 - 5.11 MIL/uL   Hemoglobin 9.7 (L) 12.0 - 15.0 g/dL   HCT 30.6 (L) 36.0 - 46.0 %   MCV 83.2 78.0 - 100.0 fL   MCH 26.4 26.0 - 34.0 pg   MCHC 31.7 30.0 - 36.0 g/dL   RDW 16.1 (H) 11.5 - 15.5 %   Platelets 169 150 - 400 K/uL  Glucose, capillary  Result Value Ref Range   Glucose-Capillary 273 (H) 65 - 99 mg/dL  Glucose, capillary  Result Value Ref Range   Glucose-Capillary 219 (H) 65 - 99 mg/dL  Glucose, capillary  Result Value Ref Range   Glucose-Capillary 213 (H) 65 - 99 mg/dL  Glucose, capillary  Result Value Ref Range   Glucose-Capillary 236 (H) 65 - 99 mg/dL  Glucose, capillary  Result Value Ref Range   Glucose-Capillary 247 (H) 65 - 99 mg/dL  Glucose, capillary  Result Value Ref Range   Glucose-Capillary 219 (H) 65 - 99 mg/dL  Glucose, capillary  Result Value Ref Range   Glucose-Capillary 162 (H) 65 - 99 mg/dL  Glucose, capillary  Result Value Ref Range   Glucose-Capillary 158 (H) 65 - 99 mg/dL  Glucose, capillary  Result Value Ref Range   Glucose-Capillary 195 (H) 65 - 99 mg/dL    Discharge Medications:     Diagnostic Studies: Dg Chest 2 View  Result Date: 04/05/2017 CLINICAL DATA:  Preoperative evaluation for total hip replacement, history diabetes mellitus, hypertension, CHF EXAM: CHEST - 2 VIEW COMPARISON:  05/20/2015 FINDINGS: Enlargement of cardiac silhouette. Mediastinal contours and pulmonary vascularity normal. Lungs clear. No pleural effusion or pneumothorax. Bones demineralized with dextroconvex scoliosis of the midthoracic spine associated with mild degenerative disc disease changes. IMPRESSION: Mild enlargement of cardiac  silhouette. No acute abnormalities. Electronically Signed   By: Lavonia Dana M.D.   On: 04/05/2017 18:02   Dg Chest Port 1v Same Day  Result Date: 04/16/2017 CLINICAL DATA:  Productive cough. EXAM: PORTABLE CHEST 1 VIEW COMPARISON:  04/05/2017 FINDINGS: The cardiac silhouette remains mildly enlarged. No airspace consolidation, edema, pleural effusion, or pneumothorax is identified. There is mild midthoracic dextroscoliosis and disc degeneration. IMPRESSION: No active disease. Electronically Signed   By: Logan Bores M.D.   On: 04/16/2017 13:10    Disposition: Discharge disposition: 03-Skilled Nursing Facility       Discharge Instructions    CPM   Complete by:  As directed    Continuous passive motion machine (CPM):      Use the CPM from 0  to 60  for 8 hours per day.      You may increase by 10 per day.  You may break it up into 2 or 3 sessions per day.      Use CPM for 3 weeks or until you are told to stop.   Call MD / Call 911   Complete by:  As directed    If you experience chest pain or shortness of breath, CALL 911 and be transported to the hospital emergency room.  If you develope a fever above 101 F, pus (white drainage) or increased drainage or redness at the wound, or calf pain, call your surgeon's office.   Constipation Prevention   Complete by:  As directed    Drink plenty of fluids.  Prune juice may be helpful.  You may use a stool softener, such as Colace (over the counter) 100 mg  twice a day.  Use MiraLax (over the counter) for constipation as needed.   Diet - low sodium heart healthy   Complete by:  As directed    Discharge instructions   Complete by:  As directed    Keep knee incision dry for 5 days post op then may wet while bathing. Therapy daily and CPM goal full extension and greater than 90 degrees flexion. Call if fever or chills or increased drainage. Go to ER if acutely short of breath or call for ambulance. Return for follow up in 2 weeks. May full weight  bear on the surgical leg unless told otherwise. Use knee immobilizer until able to straight leg raise off bed with knee stable. In house walking for first 2 weeks. INSTRUCTIONS AFTER JOINT REPLACEMENT   Remove items at home which could result in a fall. This includes throw rugs or furniture in walking pathways ICE to the affected joint every three hours while awake for 30 minutes at a time, for at least the first 3-5 days, and then as needed for pain and swelling.  Continue to use ice for pain and swelling. You may notice swelling that will progress down to the foot and ankle.  This is normal after surgery.  Elevate your leg when you are not up walking on it.   Continue to use the breathing machine you got in the hospital (incentive spirometer) which will help keep your temperature down.  It is common for your temperature to cycle up and down following surgery, especially at night when you are not up moving around and exerting yourself.  The breathing machine keeps your lungs expanded and your temperature down.   DIET:  As you were doing prior to hospitalization, we recommend a well-balanced diet.  DRESSING / WOUND CARE / SHOWERING  You may shower 3 days after surgery, but keep the wounds dry during showering.  You may use an occlusive plastic wrap (Press'n Seal for example), NO SOAKING/SUBMERGING IN THE BATHTUB.  If the bandage gets wet, change with a clean dry gauze.  If the incision gets wet, pat the wound dry with a clean towel.  ACTIVITY  Increase activity slowly as tolerated, but follow the weight bearing instructions below.   No driving for 6 weeks or until further direction given by your physician.  You cannot drive while taking narcotics.  No lifting or carrying greater than 10 lbs. until further directed by your surgeon. Avoid periods of inactivity such as sitting longer than an hour when not asleep. This helps prevent blood clots.  You may return to work once you are authorized by  your doctor.     WEIGHT BEARING   Weight bearing as tolerated with assist device (walker, cane, etc) as directed, use it as long as suggested by your surgeon or therapist, typically at least 4-6 weeks.   EXERCISES  Results after joint replacement surgery are often greatly improved when you follow the exercise, range of motion and muscle strengthening exercises prescribed by your doctor. Safety measures are also important to protect the joint from further injury. Any time any of these exercises cause you to have increased pain or swelling, decrease what you are doing until you are comfortable again and then slowly increase them. If you have problems or questions, call your caregiver or physical therapist for advice.   Rehabilitation is important following a joint replacement. After just a few days of immobilization, the muscles of the leg can become weakened and shrink (atrophy).  These exercises are designed to build up the tone and strength of the thigh and leg muscles and to improve motion. Often times heat used for twenty to thirty minutes before working out will loosen up your tissues and help with improving the range of motion but do not use heat for the first two weeks following surgery (sometimes heat can increase post-operative swelling).   These exercises can be done on a training (exercise) mat, on the floor, on a table or on a bed. Use whatever works the best and is most comfortable for you.    Use music or television while you are exercising so that the exercises are a pleasant break in your day. This will make your life better with the exercises acting as a break in your routine that you can look forward to.   Perform all exercises about fifteen times, three times per day or as directed.  You should exercise both the operative leg and the other leg as well.  Exercises include:   Quad Sets - Tighten up the muscle on the front of the thigh (Quad) and hold for 5-10 seconds.   Straight  Leg Raises - With your knee straight (if you were given a brace, keep it on), lift the leg to 60 degrees, hold for 3 seconds, and slowly lower the leg.  Perform this exercise against resistance later as your leg gets stronger.  Leg Slides: Lying on your back, slowly slide your foot toward your buttocks, bending your knee up off the floor (only go as far as is comfortable). Then slowly slide your foot back down until your leg is flat on the floor again.  Angel Wings: Lying on your back spread your legs to the side as far apart as you can without causing discomfort.  Hamstring Strength:  Lying on your back, push your heel against the floor with your leg straight by tightening up the muscles of your buttocks.  Repeat, but this time bend your knee to a comfortable angle, and push your heel against the floor.  You may put a pillow under the heel to make it more comfortable if necessary.   A rehabilitation program following joint replacement surgery can speed recovery and prevent re-injury in the future due to weakened muscles. Contact your doctor or a physical therapist for more information on knee rehabilitation.    CONSTIPATION  Constipation is defined medically as fewer than three stools per week and severe constipation as less than one stool per week.  Even if you have a regular bowel pattern at home, your normal regimen is likely to be disrupted due to multiple reasons following surgery.  Combination of anesthesia, postoperative narcotics, change in appetite and fluid intake all can affect your bowels.   YOU MUST use at least one of the following options; they are listed in order of increasing strength to get the job done.  They are all available over the counter, and you may need to use some, POSSIBLY even all of these options:    Drink plenty of fluids (prune juice may be helpful) and high fiber foods Colace 100 mg by mouth twice a day  Senokot for constipation as directed and as needed Dulcolax  (bisacodyl), take with full glass of water  Miralax (polyethylene glycol) once or twice a day as needed.  If you have tried all these things and are unable to have a bowel movement in the first 3-4 days after surgery call either your surgeon or your primary  doctor.    If you experience loose stools or diarrhea, hold the medications until you stool forms back up.  If your symptoms do not get better within 1 week or if they get worse, check with your doctor.  If you experience "the worst abdominal pain ever" or develop nausea or vomiting, please contact the office immediately for further recommendations for treatment.   ITCHING:  If you experience itching with your medications, try taking only a single pain pill, or even half a pain pill at a time.  You can also use Benadryl over the counter for itching or also to help with sleep.   TED HOSE STOCKINGS:  Use stockings on both legs until for at least 2 weeks or as directed by physician office. They may be removed at night for sleeping.  MEDICATIONS:  See your medication summary on the "After Visit Summary" that nursing will review with you.  You may have some home medications which will be placed on hold until you complete the course of blood thinner medication.  It is important for you to complete the blood thinner medication as prescribed.  PRECAUTIONS:  If you experience chest pain or shortness of breath - call 911 immediately for transfer to the hospital emergency department.   If you develop a fever greater that 101 F, purulent drainage from wound, increased redness or drainage from wound, foul odor from the wound/dressing, or calf pain - CONTACT YOUR SURGEON.                                                   FOLLOW-UP APPOINTMENTS:  If you do not already have a post-op appointment, please call the office for an appointment to be seen by your surgeon.  Guidelines for how soon to be seen are listed in your "After Visit Summary", but are typically  between 1-4 weeks after surgery.  OTHER INSTRUCTIONS:   Knee Replacement:  Do not place pillow under knee, focus on keeping the knee straight while resting. CPM instructions: 0-90 degrees, 2 hours in the morning, 2 hours in the afternoon, and 2 hours in the evening. Place foam block, curve side up under heel at all times except when in CPM or when walking.  DO NOT modify, tear, cut, or change the foam block in any way.  MAKE SURE YOU:  Understand these instructions.  Get help right away if you are not doing well or get worse.    Thank you for letting us be a part of your medical care team.  It is a privilege we respect greatly.  We hope these instructions will help you stay on track for a fast and full recovery!   Do not put a pillow under the knee. Place it under the heel.   Complete by:  As directed    Driving restrictions   Complete by:  As directed    No driving for 6 weeks   Increase activity slowly as tolerated   Complete by:  As directed        Contact information for follow-up providers    Jessy Oto, MD In 2 weeks.   Specialty:  Orthopedic Surgery Why:  For wound re-check Contact information: [place] [address] [phone]            Contact information for after-discharge care  Destination    [facility] .   Service:  Skilled Nursing Contact information: [address] [phone]                   Signed: [name] 04/17/2017, 12:17 PM

## 2017-04-16 NOTE — Anesthesia Postprocedure Evaluation (Signed)
Anesthesia Post Note  Patient: Angelica French  Procedure(s) Performed: LEFT TOTAL KNEE ARTHROPLASTY AND RIGHT KNEE INTRA-ARTICULAR STEROID INJECTION (Left )     Patient location during evaluation: PACU Anesthesia Type: General Level of consciousness: awake and alert Pain management: pain level controlled Vital Signs Assessment: post-procedure vital signs reviewed and stable Respiratory status: spontaneous breathing, nonlabored ventilation, respiratory function stable and patient connected to nasal cannula oxygen Cardiovascular status: blood pressure returned to baseline and stable Postop Assessment: no apparent nausea or vomiting Anesthetic complications: no    Last Vitals:  Vitals:   04/16/17 0804 04/16/17 1111  BP: (!) 116/49 129/67  Pulse:  60  Resp:  15  Temp:  36.6 C  SpO2:  98%    Last Pain:  Vitals:   04/16/17 1111  TempSrc: Oral  PainSc:                  Riccardo Dubin

## 2017-04-16 NOTE — Progress Notes (Signed)
Inpatient Diabetes Program Recommendations  AACE/ADA: New Consensus Statement on Inpatient Glycemic Control (2015)  Target Ranges:  Prepandial:   less than 140 mg/dL      Peak postprandial:   less than 180 mg/dL (1-2 hours)      Critically ill patients:  140 - 180 mg/dL   Lab Results  Component Value Date   GLUCAP 247 (H) 04/16/2017   HGBA1C 7.1 (H) 04/13/2017    Review of Glycemic Control Results for AREANNA, GENGLER (MRN [id number]) as of 04/16/2017 12:48  Ref. Range 04/16/2017 06:37 04/16/2017 07:38 04/16/2017 11:07 04/16/2017 11:26  Glucose-Capillary Latest Ref Range: 65 - 99 mg/dL 213 (H) 219 (H) 236 (H) 247 (H)   Diabetes history: Type 2 DM Outpatient Diabetes medications: Amaryl 4 mg BID, Victoza 0.6 mg QD Current orders for Inpatient glycemic control: Novolog 0-9 units TID  Inpatient Diabetes Program Recommendations:    Would recommend adding Lantus 10 units QHS.   Thanks, Bronson Curb, MSN, RNC-OB Diabetes Coordinator [phone] (8a-5p)

## 2017-04-16 NOTE — [place] FL2 (Signed)
[place] MEDICAID FL2 LEVEL OF CARE SCREENING TOOL     IDENTIFICATION  Patient Name: Angelica French Birthdate: [birth year] Sex: female Admission Date (Current Location): 04/13/2017  [facility] and [place] Number:  Herbalist and Address:  [facility] [place] [address], [place], [place] [address]      Provider Number: [id number]  Attending Physician Name and Address:  Jessy Oto, MD  Relative Name and Phone Number:  Nanci Lakatos, spouse, [phone]    Current Level of Care: Hospital Recommended Level of Care: [place] Prior Approval Number:    Date Approved/Denied:   PASRR Number: [id number]  Discharge Plan: SNF    Current Diagnoses: Patient Active Problem List   Diagnosis Date Noted  . Bilateral primary osteoarthritis of knee 04/13/2017    Class: Chronic  . Status post left knee replacement 04/13/2017    Orientation RESPIRATION BLADDER Height & Weight     Self, Time, Situation, Place  Normal Continent Weight: 136 lb (61.7 kg) Height:     BEHAVIORAL SYMPTOMS/MOOD NEUROLOGICAL BOWEL NUTRITION STATUS      Continent Diet(See DC Summary)  AMBULATORY STATUS COMMUNICATION OF NEEDS Skin   Limited Assist Verbally Surgical wounds                       Personal Care Assistance Level of Assistance  Dressing, Feeding, Bathing Bathing Assistance: Limited assistance Feeding assistance: Limited assistance Dressing Assistance: Limited assistance     Functional Limitations Info  Sight, Hearing, Speech Sight Info: Adequate Hearing Info: Adequate Speech Info: Adequate    SPECIAL CARE FACTORS FREQUENCY  PT (By licensed PT), OT (By licensed OT)     PT Frequency: 5x week OT Frequency: 5x week            Contractures      Additional Factors Info  Code Status, Allergies, Insulin Sliding Scale Code Status Info: Full  Allergies Info: ADHESIVE TAPE, NSAIDS  Psychotropic Info: n/a Insulin Sliding Scale  Info: Insulin Daily       Current Medications (04/16/2017):  This is the current hospital active medication list Current Facility-Administered Medications  Medication Dose Route Frequency Provider Last Rate Last Dose  . 0.9 %  sodium chloride infusion   Intravenous Continuous Jessy Oto, MD 75 mL/hr at 04/13/17 2232    . acetaminophen (TYLENOL) tablet 325-650 mg  325-650 mg Oral Q6H PRN Jessy Oto, MD      . aspirin EC tablet 325 mg  325 mg Oral Q breakfast Jessy Oto, MD   325 mg at 04/16/17 0804  . atorvastatin (LIPITOR) tablet 20 mg  20 mg Oral Once per day on Mon Wed Fri Jessy Oto, MD   20 mg at 04/13/17 2241  . bisacodyl (DULCOLAX) EC tablet 5 mg  5 mg Oral Daily PRN Jessy Oto, MD      . carvedilol (COREG) tablet 25 mg  25 mg Oral QHS Jessy Oto, MD   25 mg at 04/15/17 2133  . cholecalciferol (VITAMIN D) tablet 1,000 Units  1,000 Units Oral Brock Ra, MD   1,000 Units at 04/16/17 0805  . clindamycin (CLEOCIN) 2 % vaginal cream 1 Applicatorful  1 Applicatorful Vaginal Daily PRN Jessy Oto, MD      . dextromethorphan-guaiFENesin [facility] DM) 30-600 MG per 12 hr tablet 1 tablet  1 tablet Oral BID Jessy Oto, MD   1 tablet at 04/16/17  1210  . diphenhydrAMINE (BENADRYL) 12.5 MG/5ML elixir 12.5-25 mg  12.5-25 mg Oral Q4H PRN Jessy Oto, MD      . docusate sodium (COLACE) capsule 100 mg  100 mg Oral BID Jessy Oto, MD   100 mg at 04/16/17 0805  . fluocinonide cream (LIDEX) 1.66 % 1 application  1 application Topical Daily PRN Jessy Oto, MD      . gabapentin (NEURONTIN) capsule 300 mg  300 mg Oral TID Jessy Oto, MD   300 mg at 04/16/17 0807  . hydrALAZINE (APRESOLINE) tablet 50 mg  50 mg Oral BID AC Jessy Oto, MD      . Derrill Memo ON 04/17/2017] hydrochlorothiazide (HYDRODIURIL) tablet 12.5 mg  12.5 mg Oral Daily Jessy Oto, MD      . HYDROcodone-acetaminophen (NORCO) 7.5-325 MG per tablet 1-2 tablet  1-2 tablet Oral Q4H PRN Jessy Oto, MD      . HYDROcodone-acetaminophen (NORCO/VICODIN) 5-325 MG per tablet 1-2 tablet  1-2 tablet Oral Q4H PRN Jessy Oto, MD   2 tablet at 04/16/17 1027  . insulin aspart (novoLOG) injection 0-9 Units  0-9 Units Subcutaneous TID WC Jessy Oto, MD   3 Units at 04/16/17 1211  . loratadine (CLARITIN) tablet 10 mg  10 mg Oral Daily Jessy Oto, MD   10 mg at 04/16/17 1211  . losartan (COZAAR) tablet 50 mg  50 mg Oral QHS Jessy Oto, MD   50 mg at 04/15/17 2133  . menthol-cetylpyridinium (CEPACOL) lozenge 3 mg  1 lozenge Oral PRN Jessy Oto, MD       Or  . phenol (CHLORASEPTIC) mouth spray 1 spray  1 spray Mouth/Throat PRN Jessy Oto, MD      . metoCLOPramide (REGLAN) tablet 5-10 mg  5-10 mg Oral Q8H PRN Jessy Oto, MD       Or  . metoCLOPramide (REGLAN) injection 5-10 mg  5-10 mg Intravenous Q8H PRN Jessy Oto, MD      . morphine 2 MG/ML injection 0.5-1 mg  0.5-1 mg Intravenous Q2H PRN Jessy Oto, MD      . ondansetron [facility]) tablet 4 mg  4 mg Oral Q6H PRN Jessy Oto, MD       Or  . ondansetron (ZOFRAN) injection 4 mg  4 mg Intravenous Q6H PRN Jessy Oto, MD      . polyethylene glycol (MIRALAX / GLYCOLAX) packet 17 g  17 g Oral Daily PRN Jessy Oto, MD      . potassium chloride SA (K-DUR,KLOR-CON) CR tablet 20 mEq  20 mEq Oral Daily PRN Jessy Oto, MD      . sodium phosphate (FLEET) 7-19 GM/118ML enema 1 enema  1 enema Rectal Once PRN Jessy Oto, MD      . traMADol Veatrice Bourbon) tablet 50 mg  50 mg Oral Q6H Jessy Oto, MD   50 mg at 04/16/17 1211     Discharge Medications: Please see discharge summary for a list of discharge medications.  Relevant Imaging Results:  Relevant Lab Results:   Additional Information SS#: [id number]  Normajean Baxter, LCSW

## 2017-04-16 NOTE — Clinical Social Work Note (Signed)
Clinical Social Work Assessment  Patient Details  Name: Angelica French MRN: [id number] Date of Birth: 1940-03-09  Date of referral:  04/16/17               Reason for consult:  Facility Placement                Permission sought to share information with:  Case Manager Permission granted to share information::  Yes, Verbal Permission Granted  Name::     [facility]  Agency::  SNF  Relationship::  spouse  Contact Information:     Housing/Transportation Living arrangements for the past 2 months:  [place] of Information:  Patient Patient Interpreter Needed:    Criminal Activity/Legal Involvement Pertinent to Current Situation/Hospitalization:    Significant Relationships:  None Lives with:  Spouse Do you feel safe going back to the place where you live?  No Need for family participation in patient care:  No (Coment)  Care giving concerns:  Pt from home with spouse and will need rehabilitative therapy.  Social Worker assessment / plan:  CSW met with patient at bedside. CSW discussed SNF recommendations. Pt agreeable and pre-arranged for SNF at dc at [facility]. Pt has never been to SNF before.  CSW explained SNF process. CSW sent referral to SNF. CSW will f/u for disposition.  Employment status:  Retired Forensic scientist:  Medicare PT Recommendations:  [place] / Referral to community resources:  [place]  Patient/Family's Response to care:  Patient thanked CSW for meeting to discuss discharge plan. Pt agreeable to SNF at discharge.  Patient/Family's Understanding of and Emotional Response to Diagnosis, Current Treatment, and Prognosis:  Patient has good understanding of the diagnosis. Pt resides with spouse and cannot be cared for by spouse. Pt has pre-arranged for SNF and will discharge home when she has completed rehabilitative therapies. CSW will f/u for disposition.   Emotional Assessment Appearance:   Appears stated age Attitude/Demeanor/Rapport:  (Cooperative) Affect (typically observed):  Accepting, Appropriate Orientation:  Oriented to Situation, Oriented to  Time, Oriented to Place, Oriented to Self Alcohol / Substance use:  Not Applicable Psych involvement (Current and /or in the community):  No (Comment)  Discharge Needs  Concerns to be addressed:  Discharge Planning Concerns Readmission within the last 30 days:  No Current discharge risk:  Dependent with Mobility, Physical Impairment Barriers to Discharge:  No Barriers Identified   Normajean Baxter, LCSW 04/16/2017, 3:17 PM

## 2017-04-16 NOTE — Progress Notes (Addendum)
     Subjective: 3 Days Post-Op Procedure(s) (LRB): LEFT TOTAL KNEE ARTHROPLASTY AND RIGHT KNEE INTRA-ARTICULAR STEROID INJECTION (Left) Awake, alert and oriented x 4. When I stand it feels stout(the knee). Weak in transfers and in standing and walking. SNF planned preop and she repordedly has filled her paper work with [facility]. New productive cough.  Clear phlegm. I've been going to the bathroom like crazy. Lasix post op discontinued and will halve the HCTZ.   Patient reports pain as moderate.    Objective:   VITALS:  Temp:  [97.7 F (36.5 C)-98.2 F (36.8 C)] 97.7 F (36.5 C) (04/15 0453) Pulse Rate:  [55-74] 55 (04/15 0453) Resp:  [15-18] 15 (04/15 0453) BP: (105-124)/(49-59) 116/49 (04/15 0804) SpO2:  [97 %-99 %] 98 % (04/15 0453)  Neurologically intact ABD soft Neurovascular intact Sensation intact distally Intact pulses distally Dorsiflexion/Plantar flexion intact Incision: scant drainage and ACE wrap and padding removed and the ACE reapplied. She is able to lift right leg against gravity. 65 degrees on CPM so far.  No cellulitis present Compartment soft   LABS Recent Labs    04/14/17 0549 04/15/17 0653 04/16/17 0648  HGB 10.8* 9.6* 9.7*  WBC 9.8 13.1* 11.1*  PLT 164 173 169   Recent Labs    04/14/17 0549  NA 134*  K 4.0  CL 105  CO2 20*  BUN 40*  CREATININE 1.30*  GLUCOSE 240*   No results for input(s): LABPT, INR in the last 72 hours.   Assessment/Plan: 3 Days Post-Op Procedure(s) (LRB): LEFT TOTAL KNEE ARTHROPLASTY AND RIGHT KNEE INTRA-ARTICULAR STEROID INJECTION (Left) URI check CXR. SNF placement post hospitalization for rehab post left TKR.   Advance diet Up with therapy D/C IV fluids Plan for discharge tomorrow to SNF [facility]. Will start claritin and mucinex for URI. Check CXR due to productive cough.   [name] 04/16/2017, 11:08 AMPatient ID: Angelica French, female   DOB: 1940-03-07, 77 y.o.   MRN: [id number]

## 2017-04-17 LAB — GLUCOSE, CAPILLARY
GLUCOSE-CAPILLARY: 195 mg/dL — AB (ref 65–99)
Glucose-Capillary: 158 mg/dL — ABNORMAL HIGH (ref 65–99)

## 2017-04-17 MED ORDER — MAGIC MOUTHWASH: 5.0000 mL | Freq: Four times a day (QID) | ORAL | 0 refills | Status: DC | PRN

## 2017-04-17 MED ORDER — GLIMEPIRIDE 4 MG PO TABS: 4.0000 mg | ORAL_TABLET | Freq: Two times a day (BID) | ORAL | 3 refills | Status: DC

## 2017-04-17 MED ORDER — GLIMEPIRIDE 4 MG PO TABS
4.0000 mg | ORAL_TABLET | Freq: Two times a day (BID) | ORAL | Status: DC
  Administered 2017-04-17: 4 mg via ORAL
  Filled 2017-04-17: qty 1

## 2017-04-17 MED ORDER — MAGIC MOUTHWASH: 5.0000 mL | Freq: Four times a day (QID) | ORAL | Status: DC | PRN

## 2017-04-17 NOTE — Social Work (Signed)
Clinical Social Worker facilitated patient discharge including contacting patient family and facility to confirm patient discharge plans.  Clinical information faxed to facility and family agreeable with plan.    CSW arranged ambulance transport via [facility] to [facility].    RN to call [phone] to give report prior to discharge. Pt will go to Room 710P.  Clinical Social Worker will sign off for now as social work intervention is no longer needed. Please consult [place] again if new need arises.  Elissa Hefty, LCSW Clinical Social Worker [phone]

## 2017-04-17 NOTE — Progress Notes (Signed)
     Subjective: 4 Days Post-Op Procedure(s) (LRB): LEFT TOTAL KNEE ARTHROPLASTY AND RIGHT KNEE INTRA-ARTICULAR STEROID INJECTION (Left) Awake, alert and oriented x 4. She has not been coughing since starting claritin and mucinex. Tolerating po meds and narcotics and nourishment. I'm ready to go.  Patient reports pain as mild.    Objective:   VITALS:  Temp:  [97.9 F (36.6 C)-98.4 F (36.9 C)] 98.3 F (36.8 C) (04/16 0530) Pulse Rate:  [51-68] 51 (04/16 0530) Resp:  [15] 15 (04/15 1111) BP: (95-144)/(58-67) 95/58 (04/16 0530) SpO2:  [97 %-99 %] 99 % (04/16 0530)  Neurologically intact ABD soft Neurovascular intact Sensation intact distally Intact pulses distally Dorsiflexion/Plantar flexion intact Incision: scant drainage   LABS Recent Labs    04/15/17 0653 04/16/17 0648  HGB 9.6* 9.7*  WBC 13.1* 11.1*  PLT 173 169   No results for input(s): NA, K, CL, CO2, BUN, CREATININE, GLUCOSE in the last 72 hours. No results for input(s): LABPT, INR in the last 72 hours.   Assessment/Plan: 4 Days Post-Op Procedure(s) (LRB): LEFT TOTAL KNEE ARTHROPLASTY AND RIGHT KNEE INTRA-ARTICULAR STEROID INJECTION (Left)  Up with therapy Discharge to SNF  I will recheck later this AM and sign Rxs and be sure paper work is in order at that time.   [name] 04/17/2017, 8:04 AMPatient ID: Angelica French, female   DOB: [birth year], 77 y.o.   MRN: [id number]

## 2017-04-17 NOTE — Discharge Summary (Signed)
Physician Discharge Summary      Patient ID: Angelica French MRN: [id number] DOB/AGE: January 29, 1940 77 y.o.  Admit date: 04/13/2017 Discharge date:  04/17/2017 Admission Diagnoses:  Principal Problem:   Bilateral primary osteoarthritis of knee Active Problems:   Status post left knee replacement   Discharge Diagnoses:  Same  Past Medical History:  Diagnosis Date  . Arthritis   . Bleeding ulcer   . CHF (congestive heart failure) ([place])   . Chronic kidney disease    Renal artery stenosis - right. Left kindey ok- sees Dr Neta Ehlers- nepjrologist  . Diabetes mellitus without complication ([place])    type II  . Gastric ulcer 1994   bleeding  . History of blood transfusion    1970- illeostomy 2004-  . Hypertension   . UC (ulcerative colitis) ([place])   . VRE (vancomycin resistant enterococcus) culture positive 2004    Surgeries: Procedure(s): LEFT TOTAL KNEE ARTHROPLASTY AND RIGHT KNEE INTRA-ARTICULAR STEROID INJECTION on 04/13/2017   Consultants:   Discharged Condition: Improved Hospital Course: Angelica French is an 77 y.o. female who was admitted 04/13/2017 with a chief complaint of No chief complaint on file. , and found to have a diagnosis of Bilateral primary osteoarthritis of knee.  She was brought to the operating room on 04/13/2017 and underwent the above named procedures.    She was given perioperative antibiotics:             Anti-infectives (From admission, onward)   Start     Dose/Rate Route Frequency Ordered Stop   04/13/17 1055  ceFAZolin (ANCEF) IVPB 2g/100 mL premix     2 g 200 mL/hr over 30 Minutes Intravenous On call to O.R. 04/13/17 1055 04/13/17 1350    Post surgery she recovered in the PACU uneventfully and was transferred to [place] [facility]. POD#1 awake, alert and oriented. She was started on CPM of the left knee in the PACU and continued 8 hours per day while in the hospital. Received TXA intraoperatively. Her hgb returned at 10 . Her VSS. Dressing remained  dry. PT and OT inititiated and she was slow with PT indicating that she did not have the strength to sit up well. Pain in the left TKR was mild.POD#2 Remained afebrile, VSS Hgb with decrease to 9.1. This later rebounded to 9.7. PT and OT continued, Social service consult as she was slow with PT/OT, this was Anticipated preop and she had chosen [facility] For post hospital stay for extended rehabilitation post left  TKR. POD#3 she was awake and alert. Developed a productive cough, clear sputum. PCXR was negative for  Infiltrate, minimally enlarged cardiac shadow. Afebrile vss. CBC with out elevated WBC. Findings consistent with Viral URI. Started on mucinex and claritin. Social service consuttation. FL2 form signed. Incision examined showed  Scant 51mm area of dry blood, Ag Cell Dressing left intact and 6 inch ACE reapplied to assist with post op swelling.  Her renal function preop 1.5 Creatine was 1.3 post op. She  Has a history of a single kidney and NSAIDS should be minimized. PT and OT continued and she was ambulated with a walker short distance.  POD#4 awake alert and oriented x 4. Dressing left knee is intact and no significant  Drainage, small area 41mm round unchanged dry blood.Leave this dressing intact for next 2 weeks. No coughing since starting claritin generic and mucinex. Lasix d/ced and will start amaryl today. For SNF today. Arrangements to be made for transfer.  She was given sequential compression devices, early ambulation, and chemoprophylaxis for DVT prophylaxis.  She benefited maximally from their hospital stay and there were no complications.        Recent vital signs:  Vitals:   04/17/17 0530 04/17/17 1100  BP: (!) 95/58 (!) 115/51  Pulse: (!) 51 65  Resp:    Temp: 98.3 F (36.8 C)   SpO2: 99%     Recent laboratory studies:  Results for orders placed or performed during the hospital encounter of 04/13/17  Glucose, capillary  Result Value Ref Range    Glucose-Capillary 157 (H) 65 - 99 mg/dL   Comment 1 Notify RN    Comment 2 Document in Chart   Glucose, capillary  Result Value Ref Range   Glucose-Capillary 103 (H) 65 - 99 mg/dL  Glucose, capillary  Result Value Ref Range   Glucose-Capillary 133 (H) 65 - 99 mg/dL  CBC  Result Value Ref Range   WBC 9.8 4.0 - 10.5 K/uL   RBC 4.14 3.87 - 5.11 MIL/uL   Hemoglobin 10.8 (L) 12.0 - 15.0 g/dL   HCT 33.2 (L) 36.0 - 46.0 %   MCV 80.2 78.0 - 100.0 fL   MCH 26.1 26.0 - 34.0 pg   MCHC 32.5 30.0 - 36.0 g/dL   RDW 15.1 11.5 - 15.5 %   Platelets 164 150 - 400 K/uL  Basic metabolic panel  Result Value Ref Range   Sodium 134 (L) 135 - 145 mmol/L   Potassium 4.0 3.5 - 5.1 mmol/L   Chloride 105 101 - 111 mmol/L   CO2 20 (L) 22 - 32 mmol/L   Glucose, Bld 240 (H) 65 - 99 mg/dL   BUN 40 (H) 6 - 20 mg/dL   Creatinine, Ser 1.30 (H) 0.44 - 1.00 mg/dL   Calcium 8.2 (L) 8.9 - 10.3 mg/dL   GFR calc non Af Amer 39 (L) >60 mL/min   GFR calc Af Amer 45 (L) >60 mL/min   Anion gap 9 5 - 15  Hemoglobin A1c  Result Value Ref Range   Hgb A1c MFr Bld 7.1 (H) 4.8 - 5.6 %   Mean Plasma Glucose 157.07 mg/dL  Glucose, capillary  Result Value Ref Range   Glucose-Capillary 194 (H) 65 - 99 mg/dL  Glucose, capillary  Result Value Ref Range   Glucose-Capillary 218 (H) 65 - 99 mg/dL  Glucose, capillary  Result Value Ref Range   Glucose-Capillary 255 (H) 65 - 99 mg/dL  Glucose, capillary  Result Value Ref Range   Glucose-Capillary 307 (H) 65 - 99 mg/dL  CBC  Result Value Ref Range   WBC 13.1 (H) 4.0 - 10.5 K/uL   RBC 3.67 (L) 3.87 - 5.11 MIL/uL   Hemoglobin 9.6 (L) 12.0 - 15.0 g/dL   HCT 29.7 (L) 36.0 - 46.0 %   MCV 80.9 78.0 - 100.0 fL   MCH 26.2 26.0 - 34.0 pg   MCHC 32.3 30.0 - 36.0 g/dL   RDW 15.3 11.5 - 15.5 %   Platelets 173 150 - 400 K/uL  Glucose, capillary  Result Value Ref Range   Glucose-Capillary 263 (H) 65 - 99 mg/dL  Glucose, capillary  Result Value Ref Range   Glucose-Capillary 297  (H) 65 - 99 mg/dL  Glucose, capillary  Result Value Ref Range   Glucose-Capillary 227 (H) 65 - 99 mg/dL  Glucose, capillary  Result Value Ref Range   Glucose-Capillary 254 (H) 65 - 99 mg/dL  Glucose, capillary  Result Value  Ref Range   Glucose-Capillary 215 (H) 65 - 99 mg/dL  CBC  Result Value Ref Range   WBC 11.1 (H) 4.0 - 10.5 K/uL   RBC 3.68 (L) 3.87 - 5.11 MIL/uL   Hemoglobin 9.7 (L) 12.0 - 15.0 g/dL   HCT 30.6 (L) 36.0 - 46.0 %   MCV 83.2 78.0 - 100.0 fL   MCH 26.4 26.0 - 34.0 pg   MCHC 31.7 30.0 - 36.0 g/dL   RDW 16.1 (H) 11.5 - 15.5 %   Platelets 169 150 - 400 K/uL  Glucose, capillary  Result Value Ref Range   Glucose-Capillary 273 (H) 65 - 99 mg/dL  Glucose, capillary  Result Value Ref Range   Glucose-Capillary 219 (H) 65 - 99 mg/dL  Glucose, capillary  Result Value Ref Range   Glucose-Capillary 213 (H) 65 - 99 mg/dL  Glucose, capillary  Result Value Ref Range   Glucose-Capillary 236 (H) 65 - 99 mg/dL  Glucose, capillary  Result Value Ref Range   Glucose-Capillary 247 (H) 65 - 99 mg/dL  Glucose, capillary  Result Value Ref Range   Glucose-Capillary 219 (H) 65 - 99 mg/dL  Glucose, capillary  Result Value Ref Range   Glucose-Capillary 162 (H) 65 - 99 mg/dL  Glucose, capillary  Result Value Ref Range   Glucose-Capillary 158 (H) 65 - 99 mg/dL  Glucose, capillary  Result Value Ref Range   Glucose-Capillary 195 (H) 65 - 99 mg/dL    Discharge Medications:   Allergies as of 04/17/2017      Reactions   Adhesive [tape] Hives, Itching   Nsaids Nausea Only, Other (See Comments)   Gi pain after prolonged use  ULCER BUFFERED ASA ALSO INCLUDED      Medication List    STOP taking these medications   furosemide 40 MG tablet Commonly known as:  LASIX     TAKE these medications   acetaminophen 325 MG tablet Commonly known as:  TYLENOL Take 1-2 tablets (325-650 mg total) by mouth every 6 (six) hours as needed for mild pain (pain score 1-3 or temp > 100.5).     aspirin 325 MG EC tablet Take 1 tablet (325 mg total) by mouth daily with breakfast.   atorvastatin 20 MG tablet Commonly known as:  LIPITOR Take 20 mg by mouth. 3 times a week at bedtime   Biotin 2500 MCG Caps Take 2,500 mcg by mouth daily.   carvedilol 25 MG tablet Commonly known as:  COREG Take 25 mg by mouth at bedtime. What changed:  Another medication with the same name was removed. Continue taking this medication, and follow the directions you see here.   cholecalciferol 1000 units tablet Commonly known as:  VITAMIN D Take 1,000 Units by mouth every other day.   clindamycin 2 % vaginal cream Commonly known as:  CLEOCIN Place 1 Applicatorful vaginally daily as needed (irritation).   Desoximetasone 0.05 % Gel Apply 1 application topically daily as needed (irritation).   dextromethorphan-guaiFENesin 30-600 MG 12hr tablet Commonly known as:  MUCINEX DM Take 1 tablet by mouth 2 (two) times daily.   diphenhydrAMINE 12.5 MG/5ML elixir Commonly known as:  BENADRYL Take 5-10 mLs (12.5-25 mg total) by mouth every 4 (four) hours as needed for itching.   docusate sodium 100 MG capsule Commonly known as:  COLACE Take 1 capsule (100 mg total) by mouth 2 (two) times daily.   gabapentin 300 MG capsule Commonly known as:  NEURONTIN Take 1 capsule (300 mg total) by mouth 3 (  three) times daily.   glimepiride 4 MG tablet Commonly known as:  AMARYL Take 4 mg by mouth twice daily What changed:  Another medication with the same name was changed. Make sure you understand how and when to take each.   glimepiride 4 MG tablet Commonly known as:  AMARYL Take 1 tablet (4 mg total) by mouth 2 times daily at 12 noon and 4 pm. What changed:  when to take this   hydrALAZINE 50 MG tablet Commonly known as:  APRESOLINE Take 50 mg by mouth twice daily   hydrochlorothiazide 25 MG tablet Commonly known as:  HYDRODIURIL Take 25 mg by mouth daily.   HYDROcodone-acetaminophen 5-325 MG  tablet Commonly known as:  NORCO/VICODIN Take 1-2 tablets by mouth every 4 (four) hours as needed for moderate pain (pain score 4-6). What changed:    how much to take  when to take this  reasons to take this   loratadine 10 MG tablet Commonly known as:  CLARITIN Take 1 tablet (10 mg total) by mouth daily.   losartan 50 MG tablet Commonly known as:  COZAAR Take 50 mg by mouth at bedtime.   magic mouthwash Soln Take 5 mLs by mouth 4 (four) times daily as needed for mouth pain (thrush).   naproxen sodium 220 MG tablet Commonly known as:  ALEVE Take 220 mg by mouth daily as needed (pain).   potassium bicarbonate 25 MEQ disintegrating tablet Commonly known as:  K-LYTE Take 25 mEq by mouth daily as needed (swelling). With lasix   VICTOZA 18 MG/3ML Sopn Generic drug:  liraglutide Inject 0.6 mg once daily            Durable Medical Equipment  (From admission, onward)        Start     Ordered   04/13/17 1949  DME Walker rolling  Once    Question:  Patient needs a walker to treat with the following condition  Answer:  Status post left knee replacement   04/13/17 1948   04/13/17 1949  DME 3 n 1  Once     04/13/17 1948      Diagnostic Studies: Dg Chest 2 View  Result Date: 04/05/2017 CLINICAL DATA:  Preoperative evaluation for total hip replacement, history diabetes mellitus, hypertension, CHF EXAM: CHEST - 2 VIEW COMPARISON:  05/20/2015 FINDINGS: Enlargement of cardiac silhouette. Mediastinal contours and pulmonary vascularity normal. Lungs clear. No pleural effusion or pneumothorax. Bones demineralized with dextroconvex scoliosis of the midthoracic spine associated with mild degenerative disc disease changes. IMPRESSION: Mild enlargement of cardiac silhouette. No acute abnormalities. Electronically Signed   By: Lavonia Dana M.D.   On: 04/05/2017 18:02   Dg Chest Port 1v Same Day  Result Date: 04/16/2017 CLINICAL DATA:  Productive cough. EXAM: PORTABLE CHEST 1 VIEW  COMPARISON:  04/05/2017 FINDINGS: The cardiac silhouette remains mildly enlarged. No airspace consolidation, edema, pleural effusion, or pneumothorax is identified. There is mild midthoracic dextroscoliosis and disc degeneration. IMPRESSION: No active disease. Electronically Signed   By: Logan Bores M.D.   On: 04/16/2017 13:10    Disposition: Discharge disposition: 03-Skilled Nursing Facility       Discharge Instructions    CPM   Complete by:  As directed    Continuous passive motion machine (CPM):      Use the CPM from 0  to 60  for 8 hours per day.      You may increase by 10 per day.  You may break it up into  2 or 3 sessions per day.      Use CPM for 3 weeks or until you are told to stop.   Call MD / Call 911   Complete by:  As directed    If you experience chest pain or shortness of breath, CALL 911 and be transported to the hospital emergency room.  If you develope a fever above 101 F, pus (white drainage) or increased drainage or redness at the wound, or calf pain, call your surgeon's office.   Constipation Prevention   Complete by:  As directed    Drink plenty of fluids.  Prune juice may be helpful.  You may use a stool softener, such as Colace (over the counter) 100 mg twice a day.  Use MiraLax (over the counter) for constipation as needed.   Diet - low sodium heart healthy   Complete by:  As directed    Discharge instructions   Complete by:  As directed    Keep knee incision dry for 5 days post op then may wet while bathing. Therapy daily and CPM goal full extension and greater than 90 degrees flexion. Call if fever or chills or increased drainage. Go to ER if acutely short of breath or call for ambulance. Return for follow up in 2 weeks. May full weight bear on the surgical leg unless told otherwise. Use knee immobilizer until able to straight leg raise off bed with knee stable. In house walking for first 2 weeks. INSTRUCTIONS AFTER JOINT REPLACEMENT   Remove items at  home which could result in a fall. This includes throw rugs or furniture in walking pathways ICE to the affected joint every three hours while awake for 30 minutes at a time, for at least the first 3-5 days, and then as needed for pain and swelling.  Continue to use ice for pain and swelling. You may notice swelling that will progress down to the foot and ankle.  This is normal after surgery.  Elevate your leg when you are not up walking on it.   Continue to use the breathing machine you got in the hospital (incentive spirometer) which will help keep your temperature down.  It is common for your temperature to cycle up and down following surgery, especially at night when you are not up moving around and exerting yourself.  The breathing machine keeps your lungs expanded and your temperature down.   DIET:  As you were doing prior to hospitalization, we recommend a well-balanced diet.  DRESSING / WOUND CARE / SHOWERING  You may shower 3 days after surgery, but keep the wounds dry during showering.  You may use an occlusive plastic wrap (Press'n Seal for example), NO SOAKING/SUBMERGING IN THE BATHTUB.  If the bandage gets wet, change with a clean dry gauze.  If the incision gets wet, pat the wound dry with a clean towel.  ACTIVITY  Increase activity slowly as tolerated, but follow the weight bearing instructions below.   No driving for 6 weeks or until further direction given by your physician.  You cannot drive while taking narcotics.  No lifting or carrying greater than 10 lbs. until further directed by your surgeon. Avoid periods of inactivity such as sitting longer than an hour when not asleep. This helps prevent blood clots.  You may return to work once you are authorized by your doctor.     WEIGHT BEARING   Weight bearing as tolerated with assist device (walker, cane, etc) as directed, use it as  long as suggested by your surgeon or therapist, typically at least 4-6  weeks.   EXERCISES  Results after joint replacement surgery are often greatly improved when you follow the exercise, range of motion and muscle strengthening exercises prescribed by your doctor. Safety measures are also important to protect the joint from further injury. Any time any of these exercises cause you to have increased pain or swelling, decrease what you are doing until you are comfortable again and then slowly increase them. If you have problems or questions, call your caregiver or physical therapist for advice.   Rehabilitation is important following a joint replacement. After just a few days of immobilization, the muscles of the leg can become weakened and shrink (atrophy).  These exercises are designed to build up the tone and strength of the thigh and leg muscles and to improve motion. Often times heat used for twenty to thirty minutes before working out will loosen up your tissues and help with improving the range of motion but do not use heat for the first two weeks following surgery (sometimes heat can increase post-operative swelling).   These exercises can be done on a training (exercise) mat, on the floor, on a table or on a bed. Use whatever works the best and is most comfortable for you.    Use music or television while you are exercising so that the exercises are a pleasant break in your day. This will make your life better with the exercises acting as a break in your routine that you can look forward to.   Perform all exercises about fifteen times, three times per day or as directed.  You should exercise both the operative leg and the other leg as well.  Exercises include:   Quad Sets - Tighten up the muscle on the front of the thigh (Quad) and hold for 5-10 seconds.   Straight Leg Raises - With your knee straight (if you were given a brace, keep it on), lift the leg to 60 degrees, hold for 3 seconds, and slowly lower the leg.  Perform this exercise against resistance later as  your leg gets stronger.  Leg Slides: Lying on your back, slowly slide your foot toward your buttocks, bending your knee up off the floor (only go as far as is comfortable). Then slowly slide your foot back down until your leg is flat on the floor again.  Angel Wings: Lying on your back spread your legs to the side as far apart as you can without causing discomfort.  Hamstring Strength:  Lying on your back, push your heel against the floor with your leg straight by tightening up the muscles of your buttocks.  Repeat, but this time bend your knee to a comfortable angle, and push your heel against the floor.  You may put a pillow under the heel to make it more comfortable if necessary.   A rehabilitation program following joint replacement surgery can speed recovery and prevent re-injury in the future due to weakened muscles. Contact your doctor or a physical therapist for more information on knee rehabilitation.    CONSTIPATION  Constipation is defined medically as fewer than three stools per week and severe constipation as less than one stool per week.  Even if you have a regular bowel pattern at home, your normal regimen is likely to be disrupted due to multiple reasons following surgery.  Combination of anesthesia, postoperative narcotics, change in appetite and fluid intake all can affect your bowels.   YOU  MUST use at least one of the following options; they are listed in order of increasing strength to get the job done.  They are all available over the counter, and you may need to use some, POSSIBLY even all of these options:    Drink plenty of fluids (prune juice may be helpful) and high fiber foods Colace 100 mg by mouth twice a day  Senokot for constipation as directed and as needed Dulcolax (bisacodyl), take with full glass of water  Miralax (polyethylene glycol) once or twice a day as needed.  If you have tried all these things and are unable to have a bowel movement in the first 3-4 days  after surgery call either your surgeon or your primary doctor.    If you experience loose stools or diarrhea, hold the medications until you stool forms back up.  If your symptoms do not get better within 1 week or if they get worse, check with your doctor.  If you experience "the worst abdominal pain ever" or develop nausea or vomiting, please contact the office immediately for further recommendations for treatment.   ITCHING:  If you experience itching with your medications, try taking only a single pain pill, or even half a pain pill at a time.  You can also use Benadryl over the counter for itching or also to help with sleep.   TED HOSE STOCKINGS:  Use stockings on both legs until for at least 2 weeks or as directed by physician office. They may be removed at night for sleeping.  MEDICATIONS:  See your medication summary on the "After Visit Summary" that nursing will review with you.  You may have some home medications which will be placed on hold until you complete the course of blood thinner medication.  It is important for you to complete the blood thinner medication as prescribed.  PRECAUTIONS:  If you experience chest pain or shortness of breath - call 911 immediately for transfer to the hospital emergency department.   If you develop a fever greater that 101 F, purulent drainage from wound, increased redness or drainage from wound, foul odor from the wound/dressing, or calf pain - CONTACT YOUR SURGEON.                                                   FOLLOW-UP APPOINTMENTS:  If you do not already have a post-op appointment, please call the office for an appointment to be seen by your surgeon.  Guidelines for how soon to be seen are listed in your "After Visit Summary", but are typically between 1-4 weeks after surgery.  OTHER INSTRUCTIONS:   Knee Replacement:  Do not place pillow under knee, focus on keeping the knee straight while resting. CPM instructions: 0-90 degrees, 2 hours in the  morning, 2 hours in the afternoon, and 2 hours in the evening. Place foam block, curve side up under heel at all times except when in CPM or when walking.  DO NOT modify, tear, cut, or change the foam block in any way.  MAKE SURE YOU:  Understand these instructions.  Get help right away if you are not doing well or get worse.    Thank you for letting us be a part of your medical care team.  It is a privilege we respect greatly.  We hope these instructions will  help you stay on track for a fast and full recovery!   Do not put a pillow under the knee. Place it under the heel.   Complete by:  As directed    Driving restrictions   Complete by:  As directed    No driving for 6 weeks   Increase activity slowly as tolerated   Complete by:  As directed        Contact information for follow-up providers    Jessy Oto, MD In 2 weeks.   Specialty:  Orthopedic Surgery Why:  For wound re-check Contact information: [place] [address] [phone]            Contact information for after-discharge care    Destination    [facility] .   Service:  Skilled Nursing Contact information: [address] [place] [phone]                   Signed: [name] 04/17/2017, 12:17 PM

## 2017-04-17 NOTE — Plan of Care (Signed)
  Problem: Pain Managment: Goal: General experience of comfort will improve Reactivated   Problem: Safety: Goal: Ability to remain free from injury will improve Reactivated   Problem: Skin Integrity: Goal: Risk for impaired skin integrity will decrease Reactivated

## 2017-04-17 NOTE — Clinical Social Work Placement (Signed)
   CLINICAL SOCIAL WORK PLACEMENT  NOTE  Date:  04/17/2017  Patient Details  Name: Angelica French MRN: [id number] Date of Birth: January 18, 1940  Clinical Social Work is seeking post-discharge placement for this patient at the [place] level of care (*CSW will initial, date and re-position this form in  chart as items are completed):  Yes   Patient/family provided with [place] Work Department's list of facilities offering this level of care within the geographic area requested by the patient (or if unable, by the patient's family).  Yes   Patient/family informed of their freedom to choose among providers that offer the needed level of care, that participate in Medicare, Medicaid or managed care program needed by the patient, have an available bed and are willing to accept the patient.  Yes   Patient/family informed of [facility]'s ownership interest in [facility] and [facility], as well as of the fact that they are under no obligation to receive care at these facilities.  PASRR submitted to EDS on       PASRR number received on 04/16/17     Existing PASRR number confirmed on       FL2 transmitted to all facilities in geographic area requested by pt/family on 04/16/17     FL2 transmitted to all facilities within larger geographic area on       Patient informed that his/her managed care company has contracts with or will negotiate with certain facilities, including the following:        Yes   Patient/family informed of bed offers received.  Patient chooses bed at [facility]     Physician recommends and patient chooses bed at      Patient to be transferred to [facility] on 04/17/17.  Patient to be transferred to facility by PTAR     Patient family notified on 04/17/17 of transfer.  Name of family member notified:  pt responsible for self     PHYSICIAN       Additional Comment:     _______________________________________________ Normajean Baxter, LCSW 04/17/2017, 12:06 PM

## 2017-04-17 NOTE — Progress Notes (Addendum)
Pt given verbal and written discharge instructions. All personal belongings and equipment sent with patient to SNF. Pt taken via pt TRANSPORT PTAR. Hand off report given to nurse Jon Billings at [facility]

## 2017-04-17 NOTE — Social Work (Addendum)
Pt will go to [facility] at discharge.  CSW f/u for disposition.  Elissa Hefty, LCSW Clinical Social Worker [phone]

## 2017-04-17 NOTE — Progress Notes (Signed)
Physical Therapy Treatment Patient Details Name: ANNELLA French MRN: [id number] DOB: November 14, 1940 Today's Date: 04/17/2017    History of Present Illness 77 y.o. female s/p L TKA and R knee steroid injection 04/13/17. PMH includes: CHF, CKD, DM, HTN, Stoma, back surgery.     PT Comments    Pt performed increased activity but remains to require min assistance for gait and transfers.  Plan remains appropriate for short term SNF rehab to improve strength and function before returning to private residence.  Plan next session for continued functional mobility and strengthening exercises.     Follow Up Recommendations  Follow surgeon's recommendation for DC plan and follow-up therapies;SNF     Equipment Recommendations       Recommendations for Other Services       Precautions / Restrictions Precautions Precautions: Fall;Knee Precaution Booklet Issued: Yes (comment) Precaution Comments: reviewed supine therex and no pillow under knee Required Braces or Orthoses: Knee Immobilizer - Left Restrictions Weight Bearing Restrictions: Yes LLE Weight Bearing: Weight bearing as tolerated    Mobility  Bed Mobility Overal bed mobility: Needs Assistance Bed Mobility: Supine to Sit     Supine to sit: Min guard     General bed mobility comments: Pt required cues for hand placement and min guard for safety.    Transfers Overall transfer level: Needs assistance Equipment used: Rolling walker (2 wheeled) Transfers: Sit to/from Stand   Stand pivot transfers: Min assist       General transfer comment: Cues for hand placement to and from seated surface.  Min assistance to achieve standing.    Ambulation/Gait Ambulation/Gait assistance: Min guard Ambulation Distance (Feet): 70 Feet Assistive device: Rolling walker (2 wheeled) Gait Pattern/deviations: Step-to pattern;Antalgic;Trunk flexed;Decreased stride length;Decreased step length - right;Step-through pattern Gait velocity: decreased   General Gait Details: Cues for progression to step through gait training.  Cues for gait symmetry.     Stairs             Wheelchair Mobility    Modified Rankin (Stroke Patients Only)       Balance Overall balance assessment: Needs assistance   Sitting balance-Leahy Scale: Good       Standing balance-Leahy Scale: Fair                              Cognition Arousal/Alertness: Awake/alert Behavior During Therapy: WFL for tasks assessed/performed Overall Cognitive Status: Within Functional Limits for tasks assessed                                        Exercises Total Joint Exercises Ankle Circles/Pumps: 20 reps;AROM;Both;Supine Quad Sets: AROM;Left;10 reps;Supine Heel Slides: AROM;Left;10 reps;Supine Hip ABduction/ADduction: 10 reps;AROM;Left;Supine Straight Leg Raises: AROM;Left;10 reps;Supine Goniometric ROM: 3-74 degrees flexion.      General Comments        Pertinent Vitals/Pain Pain Assessment: 0-10 Pain Score: 6  Pain Location: L knee Pain Descriptors / Indicators: Discomfort;Aching;Operative site guarding Pain Intervention(s): Monitored during session;Repositioned    Home Living                      Prior Function            PT Goals (current goals can now be found in the care plan section) Acute Rehab PT Goals Patient Stated Goal: rehab Potential to Achieve Goals: Good  Progress towards PT goals: Progressing toward goals    Frequency    7X/week      PT Plan Current plan remains appropriate    Co-evaluation              AM-PAC PT "6 Clicks" Daily Activity  Outcome Measure  Difficulty turning over in bed (including adjusting bedclothes, sheets and blankets)?: Unable Difficulty moving from lying on back to sitting on the side of the bed? : Unable Difficulty sitting down on and standing up from a chair with arms (e.g., wheelchair, bedside commode, etc,.)?: Unable Help needed moving to  and from a bed to chair (including a wheelchair)?: A Little Help needed walking in hospital room?: A Little Help needed climbing 3-5 steps with a railing? : A Lot 6 Click Score: 11    End of Session Equipment Utilized During Treatment: Gait belt Activity Tolerance: Patient tolerated treatment well Patient left: in bed;with call bell/phone within reach;with nursing/sitter in room   PT Visit Diagnosis: Unsteadiness on feet (R26.81);Other abnormalities of gait and mobility (R26.89);Repeated falls (R29.6);Muscle weakness (generalized) (M62.81)     Time: 2334-3568 PT Time Calculation (min) (ACUTE ONLY): 27 min  Charges:  $Gait Training: 8-22 mins $Therapeutic Exercise: 8-22 mins                    G Codes:       Governor Rooks, PTA pager [phone]    Cristela Blue 04/17/2017, 1:25 PM

## 2017-04-20 ENCOUNTER — Other Ambulatory Visit (INDEPENDENT_AMBULATORY_CARE_PROVIDER_SITE_OTHER): Payer: Self-pay | Admitting: Specialist

## 2017-04-26 ENCOUNTER — Encounter (INDEPENDENT_AMBULATORY_CARE_PROVIDER_SITE_OTHER): Payer: Self-pay | Admitting: Specialist

## 2017-04-26 ENCOUNTER — Ambulatory Visit (INDEPENDENT_AMBULATORY_CARE_PROVIDER_SITE_OTHER): Payer: Medicare Other | Admitting: Specialist

## 2017-04-26 ENCOUNTER — Ambulatory Visit (INDEPENDENT_AMBULATORY_CARE_PROVIDER_SITE_OTHER): Payer: Medicare Other

## 2017-04-26 VITALS — BP 99/59 | HR 79 | Temp 97.0°F | Ht 61.0 in | Wt 136.0 lb

## 2017-04-26 DIAGNOSIS — Z96652 Presence of left artificial knee joint: Secondary | ICD-10-CM

## 2017-04-26 MED ORDER — HYDROCODONE-ACETAMINOPHEN 5-325 MG PO TABS: 1.0000 | ORAL_TABLET | ORAL | 0 refills | Status: DC | PRN

## 2017-04-26 NOTE — Progress Notes (Signed)
Post-Op Visit Note   Patient: Angelica French           Date of Birth: 1940/07/30           MRN: [id number] Visit Date: 04/26/2017 PCP: Thornton Dales I, MD   Assessment & Plan: 2 weeks post left total knee replacement, in SNF making very good progress. Should  Be ready for going home in one week.  Chief Complaint:  Chief Complaint  Patient presents with  . Left Knee - Routine Post Op   Visit Diagnoses:  1. Status post left knee replacement   Left knee with mild effusion, incision line is well healed. ROM -10 to 100.  Good quad motor.  Staples discontinued.  Plan:Continue with PT and progressive ambulation at the SNF [facility]F. She is not able to return home at her current level of function. Will likely be able to return home in one week.  May shower and get the left knee incision wet. Work on strengthening and gait and both extension and flexion of the left TKR.  Ice the knee post therapy and heat to the left thigh anteriorly prior to exercise.  Stay on aspirin 325 mg one time per day.  Follow-Up Instructions: No follow-ups on file.   Orders:  Orders Placed This Encounter  Procedures  . XR Knee 1-2 Views Left   No orders of the defined types were placed in this encounter.   Imaging: No results found.  PMFS History: Patient Active Problem List   Diagnosis Date Noted  . Bilateral primary osteoarthritis of knee 04/13/2017    Priority: High    Class: Chronic  . Status post left knee replacement 04/13/2017  . Dry eye syndrome of both lacrimal glands 05/25/2016  . Myopia with astigmatism and presbyopia, bilateral 05/25/2016  . Posterior vitreous detachment of both eyes 05/25/2016  . Refractive amblyopia of left eye 05/25/2016  . Diabetes mellitus without complication ([place]) 01/04/7251  . Depression 03/01/2016  . Hypokalemia 08/31/2015  . CKD (chronic kidney disease) stage 3, GFR 30-59 ml/min (HCC) 07/08/2015  . Type 2 diabetes mellitus with diabetic  chronic kidney disease ([place]) 06/29/2015  . Atrophy of right kidney 04/26/2015  . Benign hypertension with CKD (chronic kidney disease) stage III ([place]) 03/22/2015  . Mixed hyperlipidemia 03/22/2015  . Renal artery stenosis of unknown cause ([place]) 02/17/2015  . Right renal artery stenosis ([place]) 02/17/2015  . Hyperlipidemia 09/16/2014  . Type 2 diabetes mellitus ([place]) 09/16/2014   Past Medical History:  Diagnosis Date  . Arthritis   . Bleeding ulcer   . CHF (congestive heart failure) ([place])   . Chronic kidney disease    Renal artery stenosis - right. Left kindey ok- sees Dr Neta Ehlers- nepjrologist  . Diabetes mellitus without complication ([place])    type II  . Gastric ulcer 1994   bleeding  . History of blood transfusion    1970- illeostomy 2004-  . Hypertension   . UC (ulcerative colitis) ([place])   . VRE (vancomycin resistant enterococcus) culture positive 2004    Family History  Problem Relation Age of Onset  . Heart disease Father     Past Surgical History:  Procedure Laterality Date  . ABDOMINAL HYSTERECTOMY    . ABSCESS DRAINAGE     abdominal  . BACK SURGERY Left 1997   Left 5 DISECTOMY  . BLADDER SUSPENSION  1998  . EYE SURGERY Bilateral    cataract  . HERNIA MESH REMOVAL  2004   relocation  of ileostomy to opposite side of abdomen  . stoma hernia  2004   attempted- resulting in perforated bowel and infection  . stoma; Hernia repair     2003- attempted  . TOTAL KNEE ARTHROPLASTY Left 04/13/2017   Procedure: LEFT TOTAL KNEE ARTHROPLASTY AND RIGHT KNEE INTRA-ARTICULAR STEROID INJECTION;  Surgeon: Jessy Oto, MD;  Location: [facility];  Service: Orthopedics;  Laterality: Left;   Social History   Occupational History  . Not on file  Tobacco Use  . Smoking status: Never Smoker  . Smokeless tobacco: Never Used  Substance and Sexual Activity  . Alcohol use: Not on file    Comment: occasional drink with liqueor  . Drug use: Never  . Sexual activity: Not on file

## 2017-04-26 NOTE — Patient Instructions (Addendum)
Continue with PT and progressive ambulation at the SNF [facility] SNF. She is not able to return home at her current level of function. Will likely be able to return home in one week.  May shower and get the left knee incision wet. Work on strengthening and gait and both extension and flexion of the left TKR.  Ice the knee post therapy and heat to the left thigh anteriorly prior to exercise.  Stay on aspirin 325 mg one time per day.

## 2017-05-07 ENCOUNTER — Telehealth (INDEPENDENT_AMBULATORY_CARE_PROVIDER_SITE_OTHER): Payer: Self-pay | Admitting: Specialist

## 2017-05-07 NOTE — Telephone Encounter (Signed)
Denise from [place] called wanting let you know to be on the look out for some orders they are faxing over that needs Dr. Arvil Persons approval.

## 2017-05-08 ENCOUNTER — Telehealth (INDEPENDENT_AMBULATORY_CARE_PROVIDER_SITE_OTHER): Payer: Self-pay | Admitting: Specialist

## 2017-05-08 NOTE — Telephone Encounter (Signed)
[place]  ([place] occupational therapy evaluation no further care is needed for OT

## 2017-05-08 NOTE — Telephone Encounter (Signed)
Per [facility] occupational therapy evaluation no further care is needed for [place]  [phone]

## 2017-05-10 ENCOUNTER — Telehealth (INDEPENDENT_AMBULATORY_CARE_PROVIDER_SITE_OTHER): Payer: Self-pay | Admitting: Specialist

## 2017-05-10 NOTE — Telephone Encounter (Signed)
Megan from [place] called wanting to change the nursing frequency per patient to 1 week 3. CB # [phone]

## 2017-05-11 NOTE — Telephone Encounter (Signed)
I called and gave verbal auth ?

## 2017-05-31 ENCOUNTER — Ambulatory Visit (INDEPENDENT_AMBULATORY_CARE_PROVIDER_SITE_OTHER): Payer: Medicare Other | Admitting: Specialist

## 2017-05-31 ENCOUNTER — Encounter (INDEPENDENT_AMBULATORY_CARE_PROVIDER_SITE_OTHER): Payer: Self-pay | Admitting: Specialist

## 2017-05-31 VITALS — BP 136/77 | HR 66 | Ht 61.0 in | Wt 136.0 lb

## 2017-05-31 DIAGNOSIS — D631 Anemia in chronic kidney disease: Secondary | ICD-10-CM

## 2017-05-31 DIAGNOSIS — M1711 Unilateral primary osteoarthritis, right knee: Secondary | ICD-10-CM

## 2017-05-31 DIAGNOSIS — R200 Anesthesia of skin: Secondary | ICD-10-CM

## 2017-05-31 DIAGNOSIS — R202 Paresthesia of skin: Secondary | ICD-10-CM

## 2017-05-31 DIAGNOSIS — N183 Chronic kidney disease, stage 3 unspecified: Secondary | ICD-10-CM

## 2017-05-31 DIAGNOSIS — Z96652 Presence of left artificial knee joint: Secondary | ICD-10-CM

## 2017-05-31 LAB — BASIC METABOLIC PANEL
BUN / CREAT RATIO: 28 (calc) — AB (ref 6–22)
BUN: 41 mg/dL — ABNORMAL HIGH (ref 7–25)
CALCIUM: 9.7 mg/dL (ref 8.6–10.4)
CO2: 26 mmol/L (ref 20–32)
Chloride: 102 mmol/L (ref 98–110)
Creat: 1.46 mg/dL — ABNORMAL HIGH (ref 0.60–0.93)
Glucose, Bld: 138 mg/dL — ABNORMAL HIGH (ref 65–99)
POTASSIUM: 4.2 mmol/L (ref 3.5–5.3)
SODIUM: 140 mmol/L (ref 135–146)

## 2017-05-31 LAB — CBC WITH DIFFERENTIAL/PLATELET
BASOS ABS: 86 {cells}/uL (ref 0–200)
Basophils Relative: 0.9 %
EOS ABS: 240 {cells}/uL (ref 15–500)
Eosinophils Relative: 2.5 %
HEMATOCRIT: 33.2 % — AB (ref 35.0–45.0)
HEMOGLOBIN: 11 g/dL — AB (ref 11.7–15.5)
LYMPHS ABS: 1661 {cells}/uL (ref 850–3900)
MCH: 26.8 pg — AB (ref 27.0–33.0)
MCHC: 33.1 g/dL (ref 32.0–36.0)
MCV: 81 fL (ref 80.0–100.0)
MPV: 12.1 fL (ref 7.5–12.5)
Monocytes Relative: 7.7 %
NEUTROS ABS: 6874 {cells}/uL (ref 1500–7800)
Neutrophils Relative %: 71.6 %
Platelets: 245 10*3/uL (ref 140–400)
RBC: 4.1 10*6/uL (ref 3.80–5.10)
RDW: 15.2 % — ABNORMAL HIGH (ref 11.0–15.0)
Total Lymphocyte: 17.3 %
WBC: 9.6 10*3/uL (ref 3.8–10.8)
WBCMIX: 739 {cells}/uL (ref 200–950)

## 2017-05-31 MED ORDER — GABAPENTIN 300 MG PO CAPS: 300.0000 mg | ORAL_CAPSULE | Freq: Three times a day (TID) | ORAL | 1 refills | Status: DC

## 2017-05-31 MED ORDER — HYDROCODONE-ACETAMINOPHEN 5-325 MG PO TABS: 1.0000 | ORAL_TABLET | ORAL | 0 refills | Status: AC | PRN

## 2017-05-31 NOTE — Addendum Note (Signed)
Addended by: [name] on: 05/31/2017 03:34 PM   Modules accepted: Orders

## 2017-05-31 NOTE — Patient Instructions (Signed)
Plan: Ambulate full weight bearing on the left knee, be care, no falling, a cane if needed or walker. Ice anytime or 15-20 min on and 30 min off. Heat over the anterior thigh before exercise, ice after exercise. Strengthening exercises.  Will schedule for the right total knee replacement. Expect may require 2 weeks at least for both approval and Getting on the schedule. Risk of infection 1 in 100, bleeding risk of need of transfusion is small less than 1/2 % but need to test for anemia due to previous surgery and renal status. Risk to nerve injury is 0.7%. Take ferrous gluconate for iron supplement.  Take a multivitamin tablet.  Take vitamin D supplements and calcium supplements.

## 2017-05-31 NOTE — Progress Notes (Addendum)
Post-Op Visit Note   Patient: Angelica French           Date of Birth: [birth year]           MRN: [id number] Visit Date: 05/31/2017 PCP: Thornton Dales I, MD   Assessment & Plan:6 1/2 weeks post left TKR doing well, wishes to proceed with scheduling right TKR Chief Complaint:  Chief Complaint  Patient presents with  . Left Knee - Follow-up   Visit Diagnoses:  1. Numbness and tingling of left leg   2. S/P total knee replacement using cement, left   3. Unilateral primary osteoarthritis, right knee   Left knee incision is healed no erythrema or no fluctuance. ROM 0-120  Numbness in the distribution of the left sural nerve. Right knee with varus deformity ROm 7-125 degrees, laxity of the MCL with stressing Radiographs today with severe right medial joint line narrowing, bone on bone appearance to medial joint line With subchondral sclerosis medially, minimal medial subluxation, widening of the lateral joint line and large superior and inferior pole patella osteophytes. Patella alta.   Plan: Ambulate full weight bearing on the left knee, be care, no falling, a cane if needed or walker. Ice anytime or 15-20 min on and 30 min off. Heat over the anterior thigh before exercise, ice after exercise. Strengthening exercises.  Will schedule for the right total knee replacement. Expect may require 2 weeks at least for both approval and Getting on the schedule. Risk of infection 1 in 100, bleeding risk of need of transfusion is small less than 1/2 % but need to test for anemia due to previous surgery and renal status. Risk to nerve injury is 0.7%. Take ferrous gluconate for iron supplement.  Take a multivitamin tablet.  Take vitamin D supplements and calcium supplements.  Follow-Up Instructions: Return in about 1 month (around 06/28/2017) for post op.   Orders:  No orders of the defined types were placed in this encounter.  Meds ordered this encounter  Medications  .  HYDROcodone-acetaminophen (NORCO/VICODIN) 5-325 MG tablet    Sig: Take 1-2 tablets by mouth every 4 (four) hours as needed for up to 7 days for moderate pain (pain score 4-6).    Dispense:  30 tablet    Refill:  0  . gabapentin (NEURONTIN) 300 MG capsule    Sig: Take 1 capsule (300 mg total) by mouth 3 (three) times daily.    Dispense:  90 capsule    Refill:  1    Imaging: No results found.  PMFS History: Patient Active Problem List   Diagnosis Date Noted  . Bilateral primary osteoarthritis of knee 04/13/2017    Priority: High    Class: Chronic  . Status post left knee replacement 04/13/2017  . Dry eye syndrome of both lacrimal glands 05/25/2016  . Myopia with astigmatism and presbyopia, bilateral 05/25/2016  . Posterior vitreous detachment of both eyes 05/25/2016  . Refractive amblyopia of left eye 05/25/2016  . Diabetes mellitus without complication ([place]) 78/29/5621  . Depression 03/01/2016  . Hypokalemia 08/31/2015  . CKD (chronic kidney disease) stage 3, GFR 30-59 ml/min (HCC) 07/08/2015  . Type 2 diabetes mellitus with diabetic chronic kidney disease ([place]) 06/29/2015  . Atrophy of right kidney 04/26/2015  . Benign hypertension with CKD (chronic kidney disease) stage III ([place]) 03/22/2015  . Mixed hyperlipidemia 03/22/2015  . Renal artery stenosis of unknown cause ([place]) 02/17/2015  . Right renal artery stenosis ([place]) 02/17/2015  . Hyperlipidemia 09/16/2014  . Type  2 diabetes mellitus ([place]) 09/16/2014   Past Medical History:  Diagnosis Date  . Arthritis   . Bleeding ulcer   . CHF (congestive heart failure) ([place])   . Chronic kidney disease    Renal artery stenosis - right. Left kindey ok- sees Dr Neta Ehlers- nepjrologist  . Diabetes mellitus without complication ([place])    type II  . Gastric ulcer 1994   bleeding  . History of blood transfusion    1970- illeostomy 2004-  . Hypertension   . UC (ulcerative colitis) ([place])   . VRE (vancomycin resistant enterococcus) culture  positive 2004    Family History  Problem Relation Age of Onset  . Heart disease Father     Past Surgical History:  Procedure Laterality Date  . ABDOMINAL HYSTERECTOMY    . ABSCESS DRAINAGE     abdominal  . BACK SURGERY Left 1997   Left 5 DISECTOMY  . BLADDER SUSPENSION  1998  . EYE SURGERY Bilateral    cataract  . HERNIA MESH REMOVAL  2004   relocation of ileostomy to opposite side of abdomen  . stoma hernia  2004   attempted- resulting in perforated bowel and infection  . stoma; Hernia repair     2003- attempted  . TOTAL KNEE ARTHROPLASTY Left 04/13/2017   Procedure: LEFT TOTAL KNEE ARTHROPLASTY AND RIGHT KNEE INTRA-ARTICULAR STEROID INJECTION;  Surgeon: Jessy Oto, MD;  Location: [facility];  Service: Orthopedics;  Laterality: Left;   Social History   Occupational History  . Not on file  Tobacco Use  . Smoking status: Never Smoker  . Smokeless tobacco: Never Used  Substance and Sexual Activity  . Alcohol use: Not on file    Comment: occasional drink with liqueor  . Drug use: Never  . Sexual activity: Not on file

## 2017-06-04 DIAGNOSIS — E119 Type 2 diabetes mellitus without complications: Secondary | ICD-10-CM | POA: Insufficient documentation

## 2017-06-13 ENCOUNTER — Other Ambulatory Visit (INDEPENDENT_AMBULATORY_CARE_PROVIDER_SITE_OTHER): Payer: Self-pay

## 2017-06-15 ENCOUNTER — Other Ambulatory Visit: Payer: Self-pay

## 2017-06-15 ENCOUNTER — Encounter (HOSPITAL_COMMUNITY)
Admission: RE | Admit: 2017-06-15 | Discharge: 2017-06-15 | Disposition: A | Discharge status: Home / Self Care | Payer: Medicare Other | Source: Ambulatory Visit | Attending: Specialist | Admitting: Specialist

## 2017-06-15 ENCOUNTER — Encounter (HOSPITAL_COMMUNITY): Payer: Self-pay

## 2017-06-15 DIAGNOSIS — Z01812 Encounter for preprocedural laboratory examination: Secondary | ICD-10-CM | POA: Diagnosis not present

## 2017-06-15 LAB — URINALYSIS, ROUTINE W REFLEX MICROSCOPIC
Bilirubin Urine: NEGATIVE
GLUCOSE, UA: NEGATIVE mg/dL
Hgb urine dipstick: NEGATIVE
Ketones, ur: NEGATIVE mg/dL
NITRITE: POSITIVE — AB
PROTEIN: NEGATIVE mg/dL
Specific Gravity, Urine: 1.013 (ref 1.005–1.030)
pH: 5 (ref 5.0–8.0)

## 2017-06-15 LAB — SURGICAL PCR SCREEN
MRSA, PCR: NEGATIVE
Staphylococcus aureus: NEGATIVE

## 2017-06-15 LAB — APTT: APTT: 38 s — AB (ref 24–36)

## 2017-06-15 LAB — COMPREHENSIVE METABOLIC PANEL
ALBUMIN: 4 g/dL (ref 3.5–5.0)
ALT: 11 U/L — AB (ref 14–54)
AST: 20 U/L (ref 15–41)
Alkaline Phosphatase: 58 U/L (ref 38–126)
Anion gap: 12 (ref 5–15)
BUN: 28 mg/dL — AB (ref 6–20)
CO2: 22 mmol/L (ref 22–32)
CREATININE: 1.31 mg/dL — AB (ref 0.44–1.00)
Calcium: 10 mg/dL (ref 8.9–10.3)
Chloride: 104 mmol/L (ref 101–111)
GFR calc Af Amer: 45 mL/min — ABNORMAL LOW (ref >60)
GFR, EST NON AFRICAN AMERICAN: 38 mL/min — AB (ref >60)
GLUCOSE: 80 mg/dL (ref 65–99)
Potassium: 3.7 mmol/L (ref 3.5–5.1)
SODIUM: 138 mmol/L (ref 135–145)
Total Bilirubin: 0.8 mg/dL (ref 0.3–1.2)
Total Protein: 7.4 g/dL (ref 6.5–8.1)

## 2017-06-15 LAB — CBC
HCT: 36.1 % (ref 36.0–46.0)
Hemoglobin: 11.4 g/dL — ABNORMAL LOW (ref 12.0–15.0)
MCH: 26.6 pg (ref 26.0–34.0)
MCHC: 31.6 g/dL (ref 30.0–36.0)
MCV: 84.1 fL (ref 78.0–100.0)
Platelets: 226 10*3/uL (ref 150–400)
RBC: 4.29 MIL/uL (ref 3.87–5.11)
RDW: 16.3 % — ABNORMAL HIGH (ref 11.5–15.5)
WBC: 10.7 10*3/uL — AB (ref 4.0–10.5)

## 2017-06-15 LAB — PROTIME-INR
INR: 1.02
Prothrombin Time: 13.3 seconds (ref 11.4–15.2)

## 2017-06-15 LAB — GLUCOSE, CAPILLARY: Glucose-Capillary: 115 mg/dL — ABNORMAL HIGH (ref 65–99)

## 2017-06-15 NOTE — Progress Notes (Addendum)
PCP  Sabas Sous  Cardiologist Dr. Ricky Ala  [facility])  Nephrologist Dr. Red Christians  [facility])  IB message sent to Dr. Louanne Skye regarding abnormal urine.

## 2017-06-15 NOTE — Pre-Procedure Instructions (Signed)
Angelica French  06/15/2017      [facility] - [place], [place] - [address] AT [place] [address] [name] [address] Phone: [phone] Fax: [phone]    Your procedure is scheduled on June 25, 2017.  Report to [facility] Admitting at 530 AM.  Call this number if you have problems the morning of surgery:  [phone]   Remember:  Do not eat or drink after midnight.   Take these medicines the morning of surgery with A SIP OF WATER  Tylenol-if needed Carvedilol (coreg) Hydrocodone-acetaminophen (norco) Gabapentin (neurontin) Loratadine (claritin)  Follow your surgeon's instructions on when to hold/resume aspirin.  If no instructions were given call the office to determine how they would like to you take aspirin   7 days prior to surgery STOP taking any Aleve, Naproxen, Ibuprofen, Motrin, Advil, Goody's, BC's, all herbal medications, fish oil, and all vitamins  WHAT DO I DO ABOUT MY DIABETES MEDICATION?  Angelica French Kitchen Do not take oral diabetes medicines (pills) the morning of surgery. Glimepiride (amaryl)  . The day of surgery, do not take other diabetes injectables, including Byetta (exenatide), Bydureon (exenatide ER), Victoza (liraglutide), or Trulicity (dulaglutide).  . If your CBG is greater than 220 mg/dL, you may take  of your sliding scale (correction) dose of insulin.  Reviewed and Endorsed by [facility] Patient Education Committee, August 2015  How to Manage Your Diabetes Before and After Surgery  Why is it important to control my blood sugar before and after surgery? . Improving blood sugar levels before and after surgery helps healing and can limit problems. . A way of improving blood sugar control is eating a healthy diet by: o  Eating less sugar and carbohydrates o  Increasing activity/exercise o  Talking with your doctor about reaching your blood sugar goals . High blood sugars (greater than  180 mg/dL) can raise your risk of infections and slow your recovery, so you will need to focus on controlling your diabetes during the weeks before surgery. . Make sure that the doctor who takes care of your diabetes knows about your planned surgery including the date and location.  How do I manage my blood sugar before surgery? . Check your blood sugar at least 4 times a day, starting 2 days before surgery, to make sure that the level is not too high or low. o Check your blood sugar the morning of your surgery when you wake up and every 2 hours until you get to the Short Stay unit. . If your blood sugar is less than 70 mg/dL, you will need to treat for low blood sugar: o Do not take insulin. o Treat a low blood sugar (less than 70 mg/dL) with  cup of clear juice (cranberry or apple), 4 glucose tablets, OR glucose gel. Recheck blood sugar in 15 minutes after treatment (to make sure it is greater than 70 mg/dL). If your blood sugar is not greater than 70 mg/dL on recheck, call [phone] o  for further instructions. . Report your blood sugar to the short stay nurse when you get to Short Stay.  . If you are admitted to the hospital after surgery: o Your blood sugar will be checked by the staff and you will probably be given insulin after surgery (instead of oral diabetes medicines) to make sure you have good blood sugar levels. o The goal for blood sugar control after  surgery is 80-180 mg/dL.    Do not wear jewelry, make-up or nail polish.  Do not wear lotions, powders, or perfumes, or deodorant.  Do not shave 48 hours prior to surgery.    Do not bring valuables to the hospital.  [facility] is not responsible for any belongings or valuables.  Contacts, dentures or bridgework may not be worn into surgery.  Leave your suitcase in the car.  After surgery it may be brought to your room.  For patients admitted to the hospital, discharge time will be determined by your treatment  team.  Patients discharged the day of surgery will not be allowed to drive home.   [place]- Preparing For Surgery  Before surgery, you can play an important role. Because skin is not sterile, your skin needs to be as free of germs as possible. You can reduce the number of germs on your skin by washing with CHG (chlorahexidine gluconate) Soap before surgery.  CHG is an antiseptic cleaner which kills germs and bonds with the skin to continue killing germs even after washing.    Oral Hygiene is also important to reduce your risk of infection.  Remember - BRUSH YOUR TEETH THE MORNING OF SURGERY WITH YOUR REGULAR TOOTHPASTE  Please do not use if you have an allergy to CHG or antibacterial soaps. If your skin becomes reddened/irritated stop using the CHG.  Do not shave (including legs and underarms) for at least 48 hours prior to first CHG shower. It is OK to shave your face.  Please follow these instructions carefully.   1. Shower the NIGHT BEFORE SURGERY and the MORNING OF SURGERY with CHG.   2. If you chose to wash your hair, wash your hair first as usual with your normal shampoo.  3. After you shampoo, rinse your hair and body thoroughly to remove the shampoo.  4. Use CHG as you would any other liquid soap. You can apply CHG directly to the skin and wash gently with a scrungie or a clean washcloth.   5. Apply the CHG Soap to your body ONLY FROM THE NECK DOWN.  Do not use on open wounds or open sores. Avoid contact with your eyes, ears, mouth and genitals (private parts). Wash Face and genitals (private parts)  with your normal soap.  6. Wash thoroughly, paying special attention to the area where your surgery will be performed.  7. Thoroughly rinse your body with warm water from the neck down.  8. DO NOT shower/wash with your normal soap after using and rinsing off the CHG Soap.  9. Pat yourself dry with a CLEAN TOWEL.  10. Wear CLEAN PAJAMAS to bed the night before surgery, wear  comfortable clothes the morning of surgery  11. Place CLEAN SHEETS on your bed the night of your first shower and DO NOT SLEEP WITH PETS.  Day of Surgery:  Do not apply any deodorants/lotions.  Please wear clean clothes to the hospital/surgery center.   Remember to brush your teeth WITH YOUR REGULAR TOOTHPASTE.   Please read over the following fact sheets that you were given. Pain Booklet, Coughing and Deep Breathing, MRSA Information and Surgical Site Infection Prevention

## 2017-06-18 ENCOUNTER — Telehealth (INDEPENDENT_AMBULATORY_CARE_PROVIDER_SITE_OTHER): Payer: Self-pay

## 2017-06-18 NOTE — Telephone Encounter (Addendum)
Faxed lab results to [phone] per Dr. Louanne Skye.

## 2017-06-18 NOTE — Progress Notes (Addendum)
Anesthesia Chart Review:  Case:  [id number] Date/Time:  06/25/17 0715   Procedure:  RIGHT TOTAL KNEE ARTHROPLASTY (Right Knee)   Anesthesia type:  General   Pre-op diagnosis:  severe osteoarthritis right knee   Location:  [facility] OR ROOM 05 / [place] OR   Surgeon:  Jessy Oto, MD      DISCUSSION: Patient is a 77 year old female scheduled for the above procedure. She is s/p left TKA 04/13/17. She had medical and cardiac clearances for her April surgery. No apparent surgical or anesthesia complications and was discharged to [facility] 04/17/17.   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   Based on currently available information, I would anticipate that she can proceed as planned if no acute changes.    VS: BP (!) 134/55   Pulse 78   Temp 36.7 C (Oral)   Resp 18   Ht 5' (1.524 m)   Wt 134 lb 6 oz (61 kg)   SpO2 100%   BMI 26.24 kg/m   PROVIDERS: - [facility]  - Cardi[facility] Ricky [facility] Last visit 03/21/17. He had cleared patient for her April left TKA following a low risk Lexiscan stress test.  - Endocrinologist is Dr. Amalia Greenhouse [facility]). Last visit 04/06/17. - Nephrolog[facility] Dr. Red Christians [facility] Last visit 05/02/17. Four month follow-up planned.   LABS: Preoperative labs noted. BUN 28, Cr 1.31 which appears stable. (Previous BUN/Cr 25/1.22 03/20/17, 24/1.06 01/09/17 [place]; BUN 23-41, Cr 1.30-1.55 04/2017 [facility]). WBC 10.7. H/H 11.4/36.1. PT/INR WNL. PTT 38. T&S showed anti JKA antibody. UA showed small leukocytes, but positive nitrites. UA results called to [place] at Dr. Otho Ket office. Defer treatment recommendations, if any, to surgeon. A1c 7.1 on 04/14/17. (all  labs ordered are listed, but only abnormal results are displayed)  Labs Reviewed  GLUCOSE, CAPILLARY - Abnormal; Notable for the following components:      Result Value   Glucose-Capillary 115 (*)    All other components within normal limits  APTT - Abnormal; Notable for the following components:   aPTT 38 (*)    All other components within normal limits  CBC - Abnormal; Notable for the following components:   WBC 10.7 (*)    Hemoglobin 11.4 (*)    RDW 16.3 (*)    All other components within normal limits  COMPREHENSIVE METABOLIC PANEL - Abnormal; Notable for the following components:   BUN 28 (*)    Creatinine, Ser 1.31 (*)    ALT 11 (*)    GFR calc non Af Amer 38 (*)    GFR calc Af Amer 45 (*)    All other components within normal limits  URINALYSIS, ROUTINE W REFLEX MICROSCOPIC - Abnormal; Notable for the following components:   Color, Urine AMBER (*)    Nitrite POSITIVE (*)    Leukocytes, UA SMALL (*)    WBC, UA >50 (*)    Bacteria, UA FEW (*)    All other components within normal limits  SURGICAL PCR SCREEN  PROTIME-INR  TYPE AND SCREEN    IMAGES: 1V CXR 04/16/17: FINDINGS: The cardiac silhouette remains mildly enlarged. No airspace consolidation, edema, pleural effusion, or pneumothorax is identified. There is mild midthoracic dextroscoliosis and disc  degeneration. IMPRESSION: No active disease.   EKG: EKG 04/13/17: SR with PACs, non-specific ST abnormality.    CV: Nuclear stress test 03/27/17 ([place]): Summary There is normal isotope uptake following Lexiscan injection and at rest. There is no evidence of ischemia. Normal LV function with EF of 68 %.  Echo 06/15/14*: Report not viewable in [place]. According to Dr. Deatra Robinson 09/16/14 note, "I have reviewed the echo and she has normal LV function with no significant valve disease."  Carotid U/S 05/31/15* (copy from Dr. Donnetta Hutching):  Interpretations: 1.  Mild 1 to 39% stenosis of the right  internal carotid artery. 2.  No occlusive disease of the left internal carotid artery. 3.  Antegrade flow through both vertebral arteries.  The subclavian arteries have multiphasic waveforms. 4.  No source of bruit identified.  (*On 06/18/17, I re-requested copies of these reports from Dr. Deatra Robinson office, but based on my 04/12/17 notation, "Despite multiple phone calls and at least three fax requests for echo and carotid Duplex results, records were still not sent. Dr. Donnetta Hutching at least summarized the echo results in his note." On 06/22/17, I did eventually receive the carotid U/S report, but no echo.)   Renal U/S 02/01/17 ([place]): IMPRESSION:  1. Presumably chronic atrophy of the right kidney with nondiagnostic  evaluation of the arterial flow to the right kidney.  2. Normal sonographic appearance of the left kidney without evidence  of renal artery stenosis.    Past Medical History:  Diagnosis Date  . Arthritis   . Bleeding ulcer   . CHF (congestive heart failure) ([place])   . Chronic kidney disease    Renal artery stenosis - right. Left kindey ok- sees Dr Neta Ehlers- nepjrologist  . Diabetes mellitus without complication ([place])    type II  . Gastric ulcer 1994   bleeding  . History of blood transfusion    1970- illeostomy 2004-  . Hypertension   . UC (ulcerative colitis) ([place])   . VRE (vancomycin resistant enterococcus) culture positive 2004    Past Surgical History:  Procedure Laterality Date  . ABDOMINAL HYSTERECTOMY    . ABSCESS DRAINAGE     abdominal  . BACK SURGERY Left 1997   Left 5 DISECTOMY  . BLADDER SUSPENSION  1998  . EYE SURGERY Bilateral    cataract  . HERNIA MESH REMOVAL  2004   relocation of ileostomy to opposite side of abdomen  . PERMANENT ILEOSTOMY  1970  . stoma hernia  2004   attempted- resulting in perforated bowel and infection  . stoma; Hernia repair     2003- attempted  . TOTAL KNEE ARTHROPLASTY Left 04/13/2017   Procedure: LEFT TOTAL  KNEE ARTHROPLASTY AND RIGHT KNEE INTRA-ARTICULAR STEROID INJECTION;  Surgeon: Jessy Oto, MD;  Location: [facility];  Service: Orthopedics;  Laterality: Left;  As of 04/2017, She uses [place]ister ileostomy flange [phone]) and pouch (#[id number]).  MEDICATIONS: . acetaminophen (TYLENOL) 325 MG tablet  . aspirin EC 325 MG EC tablet  . atorvastatin (LIPITOR) 20 MG tablet  . Biotin 2500 MCG CAPS  . carvedilol (COREG) 25 MG tablet  . cholecalciferol (VITAMIN D) 1000 units tablet  . clindamycin (CLEOCIN) 2 % vaginal cream  . Desoximetasone 0.05 % GEL  . dextromethorphan-guaiFENesin (MUCINEX DM) 30-600 MG 12hr tablet  . diphenhydrAMINE (BENADRYL) 12.5 MG/5ML elixir  . docusate sodium (COLACE) 100 MG capsule  . furosemide (LASIX) 40 MG tablet  . gabapentin (NEURONTIN) 300 MG capsule  . glimepiride (AMARYL) 4  MG tablet  . hydrALAZINE (APRESOLINE) 50 MG tablet  . hydrochlorothiazide (HYDRODIURIL) 25 MG tablet  . HYDROcodone-acetaminophen (NORCO/VICODIN) 5-325 MG tablet  . liraglutide (VICTOZA) 18 MG/3ML SOPN  . loratadine (CLARITIN) 10 MG tablet  . losartan (COZAAR) 50 MG tablet  . magic mouthwash SOLN  . potassium bicarbonate (K-LYTE) 25 MEQ disintegrating tablet   No current facility-administered medications for this encounter.   Patient is not currently taking ASA, Tylenol, Mucinex DM, Benadryl, Colace, Claritin.   George Hugh [facility] Short Stay Center/Anesthesiology Phone [phone] 06/18/2017 1:29 PM

## 2017-06-21 ENCOUNTER — Ambulatory Visit (INDEPENDENT_AMBULATORY_CARE_PROVIDER_SITE_OTHER): Payer: Medicare Other | Admitting: Surgery

## 2017-06-21 ENCOUNTER — Encounter (INDEPENDENT_AMBULATORY_CARE_PROVIDER_SITE_OTHER): Payer: Self-pay | Admitting: Surgery

## 2017-06-21 VITALS — BP 118/62 | HR 59 | Temp 97.5°F | Ht 60.0 in | Wt 134.0 lb

## 2017-06-21 DIAGNOSIS — M1711 Unilateral primary osteoarthritis, right knee: Secondary | ICD-10-CM | POA: Diagnosis not present

## 2017-06-21 NOTE — Progress Notes (Signed)
77 year old white female history of end-stage DJD right knee and chronic pain comes in today for preoperative history and physical.  She is recently had left total knee done and this is doing great.  Full H&P placed in patient's chart.

## 2017-06-21 NOTE — H&P (Addendum)
TOTAL KNEE ADMISSION H&P  Patient is being admitted for right total knee arthroplasty.  Subjective:  Chief Complaint:right knee pain.  HPI: Angelica French, 77 y.o. female, has a history of pain and functional disability in the right knee due to arthritis and has failed non-surgical conservative treatments for greater than 12 weeks to includecorticosteriod injections, use of assistive devices, weight reduction as appropriate and activity modification.  Onset of symptoms was gradual, starting 10 years ago with gradually worsening course since that time.   Patient has night pain, pain with passive range of motion, crepitus and joint swelling.  Patient has evidence of subchondral sclerosis, periarticular osteophytes and joint space narrowing by imaging studies. . There is no active infection.  Patient Active Problem List   Diagnosis Date Noted  . Bilateral primary osteoarthritis of knee 04/13/2017    Class: Chronic  . Status post left knee replacement 04/13/2017  . Dry eye syndrome of both lacrimal glands 05/25/2016  . Myopia with astigmatism and presbyopia, bilateral 05/25/2016  . Posterior vitreous detachment of both eyes 05/25/2016  . Refractive amblyopia of left eye 05/25/2016  . Diabetes mellitus without complication ([place]) 47/42/5956  . Depression 03/01/2016  . Hypokalemia 08/31/2015  . CKD (chronic kidney disease) stage 3, GFR 30-59 ml/min (HCC) 07/08/2015  . Type 2 diabetes mellitus with diabetic chronic kidney disease ([place]) 06/29/2015  . Atrophy of right kidney 04/26/2015  . Benign hypertension with CKD (chronic kidney disease) stage III ([place]) 03/22/2015  . Mixed hyperlipidemia 03/22/2015  . Renal artery stenosis of unknown cause ([place]) 02/17/2015  . Right renal artery stenosis ([place]) 02/17/2015  . Hyperlipidemia 09/16/2014  . Type 2 diabetes mellitus ([place]) 09/16/2014   Past Medical History:  Diagnosis Date  . Arthritis   . Bleeding ulcer   . CHF (congestive heart failure)  ([place])   . Chronic kidney disease    Renal artery stenosis - right. Left kindey ok- sees Dr Neta Ehlers- nepjrologist  . Diabetes mellitus without complication ([place])    type II  . Gastric ulcer 1994   bleeding  . History of blood transfusion    1970- illeostomy 2004-  . Hypertension   . UC (ulcerative colitis) ([place])   . VRE (vancomycin resistant enterococcus) culture positive 2004    Past Surgical History:  Procedure Laterality Date  . ABDOMINAL HYSTERECTOMY    . ABSCESS DRAINAGE     abdominal  . BACK SURGERY Left 1997   Left 5 DISECTOMY  . BLADDER SUSPENSION  1998  . EYE SURGERY Bilateral    cataract  . HERNIA MESH REMOVAL  2004   relocation of ileostomy to opposite side of abdomen  . PERMANENT ILEOSTOMY  1970  . stoma hernia  2004   attempted- resulting in perforated bowel and infection  . stoma; Hernia repair     2003- attempted  . TOTAL KNEE ARTHROPLASTY Left 04/13/2017   Procedure: LEFT TOTAL KNEE ARTHROPLASTY AND RIGHT KNEE INTRA-ARTICULAR STEROID INJECTION;  Surgeon: Jessy Oto, MD;  Location: [facility];  Service: Orthopedics;  Laterality: Left;    No current facility-administered medications for this encounter.    Current Outpatient Medications  Medication Sig Dispense Refill Last Dose  . atorvastatin (LIPITOR) 20 MG tablet Take 20 mg by mouth. On Monday, Wednesday and Friday   Past Week at Unknown time  . Biotin 2500 MCG CAPS Take 2,500 mcg by mouth daily.    Past Week at Unknown time  . carvedilol (COREG) 25 MG tablet Take 25 mg by  mouth at bedtime.    04/12/2017 at 2330  . cholecalciferol (VITAMIN D) 1000 units tablet Take 1,000 Units by mouth every other day.    Past Week at Unknown time  . Desoximetasone 0.05 % GEL Apply 1 application topically daily as needed (ostomy pouch irritation).    More than a month at Unknown time  . furosemide (LASIX) 40 MG tablet Take 40 mg by mouth daily as needed for fluid or edema.     . gabapentin (NEURONTIN) 300 MG capsule Take 1 capsule  (300 mg total) by mouth 3 (three) times daily. (Patient taking differently: Take 600 mg by mouth 3 (three) times daily. ) 90 capsule 1   . glimepiride (AMARYL) 4 MG tablet Take 1 tablet (4 mg total) by mouth 2 times daily at 12 noon and 4 pm. 60 tablet 3   . hydrALAZINE (APRESOLINE) 50 MG tablet Take 50 mg by mouth twice daily   04/13/2017 at 1000  . hydrochlorothiazide (HYDRODIURIL) 25 MG tablet Take 25 mg by mouth every morning.   1 04/12/2017 at Unknown time  . HYDROcodone-acetaminophen (NORCO/VICODIN) 5-325 MG tablet Take 1 tablet by mouth at bedtime as needed for moderate pain.     [place] Kitchen liraglutide (VICTOZA) 18 MG/3ML SOPN Inject 0.6 mg once daily   04/12/2017 at Unknown time  . losartan (COZAAR) 50 MG tablet Take 50 mg by mouth at bedtime.    04/12/2017 at Unknown time  . acetaminophen (TYLENOL) 325 MG tablet Take 1-2 tablets (325-650 mg total) by mouth every 6 (six) hours as needed for mild pain (pain score 1-3 or temp > 100.5). (Patient not taking: Reported on 06/11/2017) 40 tablet 1 Not Taking at Unknown time  . aspirin EC 325 MG EC tablet Take 1 tablet (325 mg total) by mouth daily with breakfast. (Patient not taking: Reported on 06/11/2017) 30 tablet 0 Not Taking at Unknown time  . clindamycin (CLEOCIN) 2 % vaginal cream Place 1 Applicatorful vaginally daily as needed (irritation).    More than a month at Unknown time  . dextromethorphan-guaiFENesin (MUCINEX DM) 30-600 MG 12hr tablet Take 1 tablet by mouth 2 (two) times daily. (Patient not taking: Reported on 06/11/2017) 60 tablet 1 Not Taking at Unknown time  . diphenhydrAMINE (BENADRYL) 12.5 MG/5ML elixir Take 5-10 mLs (12.5-25 mg total) by mouth every 4 (four) hours as needed for itching. (Patient not taking: Reported on 06/11/2017) 120 mL 0 Not Taking at Unknown time  . docusate sodium (COLACE) 100 MG capsule Take 1 capsule (100 mg total) by mouth 2 (two) times daily. (Patient not taking: Reported on 06/11/2017) 10 capsule 0 Not Taking at Unknown time   . loratadine (CLARITIN) 10 MG tablet Take 1 tablet (10 mg total) by mouth daily. (Patient not taking: Reported on 06/11/2017) 30 tablet 1 Not Taking at Unknown time  . magic mouthwash SOLN Take 5 mLs by mouth 4 (four) times daily as needed for mouth pain (thrush). (Patient not taking: Reported on 06/11/2017) 100 mL 0 Not Taking at Unknown time  . potassium bicarbonate (K-LYTE) 25 MEQ disintegrating tablet Take 25 mEq by mouth daily as needed (swelling). With lasix   More than a month at Unknown time   Allergies  Allergen Reactions  . Adhesive [Tape] Hives and Itching  . Nsaids Nausea Only and Other (See Comments)    Gi pain after prolonged use  ULCER BUFFERED ASA ALSO INCLUDED    Social History   Tobacco Use  . Smoking status: Never Smoker  .  Smokeless tobacco: Never Used  Substance Use Topics  . Alcohol use: Yes    Comment: occasional drink with liqueor    Family History  Problem Relation Age of Onset  . Heart disease Father      Review of Systems  HENT: Negative.   Eyes: Negative.   Respiratory: Negative.   Cardiovascular: Negative.   Gastrointestinal: Negative.   Genitourinary: Negative.   Musculoskeletal: Positive for joint pain.  Skin: Negative.   Neurological: Negative.   Psychiatric/Behavioral: Negative.     Objective:  Physical Exam  Constitutional: She is oriented to person, place, and time. She appears well-developed. No distress.  HENT:  Head: Normocephalic.  Eyes: Pupils are equal, round, and reactive to light. EOM are normal.  Cardiovascular: Normal rate.  Respiratory: Effort normal. No respiratory distress. She has no wheezes.  GI: She exhibits no distension.  Musculoskeletal: She exhibits tenderness.  Neurological: She is alert and oriented to person, place, and time.  Skin: Skin is warm and dry.  Psychiatric: She has a normal mood and affect.    Vital signs in last 24 hours: Temp:  [97.5 F (36.4 C)] 97.5 F (36.4 C) (06/20 1323) Pulse Rate:   [59] 59 (06/20 1323) BP: (118)/(62) 118/62 (06/20 1323) Weight:  [134 lb (60.8 kg)] 134 lb (60.8 kg) (06/20 1323)  Labs:   Estimated body mass index is 26.17 kg/m as calculated from the following:   Height as of 06/21/17: 5' (1.524 m).   Weight as of 06/21/17: 134 lb (60.8 kg).   Imaging Review Plain radiographs demonstrate moderate degenerative joint disease of the right knee(s). The overall alignment isneutral. The bone quality appears to be good for age and reported activity level.   Preoperative templating of the joint replacement has been completed, documented, and submitted to the Operating Room personnel in order to optimize intra-operative equipment management.    Assessment/Plan:  End stage arthritis, right knee   The patient history, physical examination, clinical judgment of the provider and imaging studies are consistent with end stage degenerative joint disease of the right knee(s) and total knee arthroplasty is deemed medically necessary. The treatment options including medical management, injection therapy arthroscopy and arthroplasty were discussed at length. The risks and benefits of total knee arthroplasty were presented and reviewed. The risks due to aseptic loosening, infection, stiffness, patella tracking problems, thromboembolic complications and other imponderables were discussed. The patient acknowledged the explanation, agreed to proceed with the plan and consent was signed. Patient is being admitted for inpatient treatment for surgery, pain control, PT, OT, pro phylactic antibiotics, VTE prophylaxis, progressive ambulation and ADL's and discharge planning. The patient is planning to be discharged to skilled nursing facility

## 2017-06-22 NOTE — Progress Notes (Signed)
Spoke with Dr Donnetta Hutching office requested that carotid Duplex and echo be faxed to [place]. States they can not send the echo because a different Dr did it. States she will send the Carotid Duplex.

## 2017-06-24 ENCOUNTER — Encounter (HOSPITAL_COMMUNITY): Payer: Self-pay | Admitting: Anesthesiology

## 2017-06-24 NOTE — Anesthesia Preprocedure Evaluation (Addendum)
Anesthesia Evaluation  Patient identified by MRN, date of birth, ID band Patient awake    Reviewed: Allergy & Precautions, NPO status , Patient's Chart, lab work & pertinent test results  Airway Mallampati: II  TM Distance: >3 FB Neck ROM: Full    Dental  (+) Teeth Intact, Dental Advisory Given   Pulmonary    breath sounds clear to auscultation       Cardiovascular hypertension, Pt. on home beta blockers and Pt. on medications + Peripheral Vascular Disease and +CHF   Rhythm:Regular Rate:Normal     Neuro/Psych PSYCHIATRIC DISORDERS Depression    GI/Hepatic PUD,   Endo/Other  diabetes, Type 2, Oral Hypoglycemic Agents  Renal/GU CRFRenal disease  negative genitourinary   Musculoskeletal  (+) Arthritis , Osteoarthritis,    Abdominal Normal abdominal exam  (+)   Peds  Hematology   Anesthesia Other Findings - UC -   Reproductive/Obstetrics                           Lab Results  Component Value Date   WBC 10.7 (H) 06/15/2017   HGB 11.4 (L) 06/15/2017   HCT 36.1 06/15/2017   MCV 84.1 06/15/2017   PLT 226 06/15/2017   Lab Results  Component Value Date   CREATININE 1.31 (H) 06/15/2017   BUN 28 (H) 06/15/2017   NA 138 06/15/2017   K 3.7 06/15/2017   CL 104 06/15/2017   CO2 22 06/15/2017   Lab Results  Component Value Date   INR 1.02 06/15/2017   INR 1.05 04/05/2017   EKG: NSR   Anesthesia Physical Anesthesia Plan  ASA: III  Anesthesia Plan: General   Post-op Pain Management: GA combined w/ Regional for post-op pain   Induction: Intravenous  PONV Risk Score and Plan: 4 or greater and Ondansetron, Treatment may vary due to age or medical condition and Midazolam  Airway Management Planned: Oral ETT  Additional Equipment: None  Intra-op Plan:   Post-operative Plan: Extubation in OR  Informed Consent: I have reviewed the patients History and Physical, chart, labs and  discussed the procedure including the risks, benefits and alternatives for the proposed anesthesia with the patient or authorized representative who has indicated his/her understanding and acceptance.   Dental advisory given  Plan Discussed with: CRNA  Anesthesia Plan Comments:       Anesthesia Quick Evaluation

## 2017-06-25 ENCOUNTER — Ambulatory Visit (HOSPITAL_COMMUNITY): Payer: Medicare Other

## 2017-06-25 ENCOUNTER — Ambulatory Visit (HOSPITAL_COMMUNITY): Payer: Medicare Other | Admitting: Anesthesiology

## 2017-06-25 ENCOUNTER — Other Ambulatory Visit: Payer: Self-pay

## 2017-06-25 ENCOUNTER — Encounter (HOSPITAL_COMMUNITY)
Admission: RE | Disposition: A | Discharge status: Skilled Nursing Facility | Payer: Self-pay | Source: Home / Self Care | Attending: Specialist

## 2017-06-25 ENCOUNTER — Inpatient Hospital Stay (HOSPITAL_COMMUNITY)
Admission: RE | Admit: 2017-06-25 | Discharge: 2017-06-28 | DRG: 470 | Disposition: A | Discharge status: Skilled Nursing Facility | Payer: Medicare Other | Attending: Specialist | Admitting: Specialist

## 2017-06-25 ENCOUNTER — Encounter (HOSPITAL_COMMUNITY): Payer: Self-pay | Admitting: Surgery

## 2017-06-25 ENCOUNTER — Ambulatory Visit (HOSPITAL_COMMUNITY): Payer: Medicare Other | Admitting: Vascular Surgery

## 2017-06-25 DIAGNOSIS — Z96652 Presence of left artificial knee joint: Secondary | ICD-10-CM | POA: Diagnosis present

## 2017-06-25 DIAGNOSIS — Z751 Person awaiting admission to adequate facility elsewhere: Secondary | ICD-10-CM | POA: Diagnosis not present

## 2017-06-25 DIAGNOSIS — T40605A Adverse effect of unspecified narcotics, initial encounter: Secondary | ICD-10-CM | POA: Diagnosis not present

## 2017-06-25 DIAGNOSIS — M1711 Unilateral primary osteoarthritis, right knee: Principal | ICD-10-CM

## 2017-06-25 DIAGNOSIS — E1165 Type 2 diabetes mellitus with hyperglycemia: Secondary | ICD-10-CM | POA: Diagnosis not present

## 2017-06-25 DIAGNOSIS — E782 Mixed hyperlipidemia: Secondary | ICD-10-CM | POA: Diagnosis present

## 2017-06-25 DIAGNOSIS — N183 Chronic kidney disease, stage 3 (moderate): Secondary | ICD-10-CM | POA: Diagnosis present

## 2017-06-25 DIAGNOSIS — I509 Heart failure, unspecified: Secondary | ICD-10-CM | POA: Diagnosis present

## 2017-06-25 DIAGNOSIS — Z7984 Long term (current) use of oral hypoglycemic drugs: Secondary | ICD-10-CM | POA: Diagnosis not present

## 2017-06-25 DIAGNOSIS — D62 Acute posthemorrhagic anemia: Secondary | ICD-10-CM | POA: Diagnosis not present

## 2017-06-25 DIAGNOSIS — I130 Hypertensive heart and chronic kidney disease with heart failure and stage 1 through stage 4 chronic kidney disease, or unspecified chronic kidney disease: Secondary | ICD-10-CM | POA: Diagnosis present

## 2017-06-25 DIAGNOSIS — N9989 Other postprocedural complications and disorders of genitourinary system: Secondary | ICD-10-CM | POA: Diagnosis not present

## 2017-06-25 DIAGNOSIS — Z90710 Acquired absence of both cervix and uterus: Secondary | ICD-10-CM | POA: Diagnosis not present

## 2017-06-25 DIAGNOSIS — Z96651 Presence of right artificial knee joint: Secondary | ICD-10-CM

## 2017-06-25 DIAGNOSIS — Y92230 Patient room in hospital as the place of occurrence of the external cause: Secondary | ICD-10-CM | POA: Diagnosis not present

## 2017-06-25 DIAGNOSIS — Z01818 Encounter for other preprocedural examination: Secondary | ICD-10-CM

## 2017-06-25 DIAGNOSIS — D500 Iron deficiency anemia secondary to blood loss (chronic): Secondary | ICD-10-CM | POA: Diagnosis not present

## 2017-06-25 DIAGNOSIS — Z8249 Family history of ischemic heart disease and other diseases of the circulatory system: Secondary | ICD-10-CM | POA: Diagnosis not present

## 2017-06-25 DIAGNOSIS — E1122 Type 2 diabetes mellitus with diabetic chronic kidney disease: Secondary | ICD-10-CM | POA: Diagnosis present

## 2017-06-25 DIAGNOSIS — R338 Other retention of urine: Secondary | ICD-10-CM | POA: Diagnosis not present

## 2017-06-25 DIAGNOSIS — N390 Urinary tract infection, site not specified: Secondary | ICD-10-CM | POA: Diagnosis present

## 2017-06-25 HISTORY — PX: TOTAL KNEE ARTHROPLASTY: SHX125

## 2017-06-25 LAB — GLUCOSE, CAPILLARY
GLUCOSE-CAPILLARY: 243 mg/dL — AB (ref 65–99)
GLUCOSE-CAPILLARY: 266 mg/dL — AB (ref 65–99)
Glucose-Capillary: 134 mg/dL — ABNORMAL HIGH (ref 65–99)
Glucose-Capillary: 155 mg/dL — ABNORMAL HIGH (ref 65–99)

## 2017-06-25 LAB — TYPE AND SCREEN
ABO/RH(D): A POS
Antibody Screen: NEGATIVE

## 2017-06-25 LAB — HEMOGLOBIN A1C
Hgb A1c MFr Bld: 6.6 % — ABNORMAL HIGH (ref 4.8–5.6)
Mean Plasma Glucose: 142.72 mg/dL

## 2017-06-25 SURGERY — ARTHROPLASTY, KNEE, TOTAL
Anesthesia: General | Site: Knee | Laterality: Right

## 2017-06-25 MED ORDER — ONDANSETRON HCL 4 MG/2ML IJ SOLN
4.0000 mg | Freq: Four times a day (QID) | INTRAMUSCULAR | Status: DC | PRN
Start: 2017-06-25 — End: 2017-06-28

## 2017-06-25 MED ORDER — 0.9 % SODIUM CHLORIDE (POUR BTL) OPTIME
TOPICAL | Status: DC | PRN
  Administered 2017-06-25: 1000 mL

## 2017-06-25 MED ORDER — SODIUM CHLORIDE 0.9 % IJ SOLN
INTRAMUSCULAR | Status: AC
  Filled 2017-06-25: qty 10

## 2017-06-25 MED ORDER — HYDROCODONE-ACETAMINOPHEN 5-325 MG PO TABS: 1.0000 | ORAL_TABLET | Freq: Every evening | ORAL | Status: DC | PRN

## 2017-06-25 MED ORDER — ONDANSETRON HCL 4 MG PO TABS: 4.0000 mg | ORAL_TABLET | Freq: Four times a day (QID) | ORAL | Status: DC | PRN

## 2017-06-25 MED ORDER — HYDROMORPHONE HCL 1 MG/ML IJ SOLN
INTRAMUSCULAR | Status: AC
  Administered 2017-06-25: 0.5 mg via INTRAVENOUS
  Filled 2017-06-25: qty 1

## 2017-06-25 MED ORDER — ASPIRIN EC 325 MG PO TBEC
325.0000 mg | DELAYED_RELEASE_TABLET | Freq: Every day | ORAL | Status: DC
  Administered 2017-06-26 – 2017-06-28 (×3): 325 mg via ORAL
  Filled 2017-06-25 (×3): qty 1

## 2017-06-25 MED ORDER — DOCUSATE SODIUM 100 MG PO CAPS
100.0000 mg | ORAL_CAPSULE | Freq: Two times a day (BID) | ORAL | Status: DC
  Administered 2017-06-25 – 2017-06-28 (×6): 100 mg via ORAL
  Filled 2017-06-25 (×6): qty 1

## 2017-06-25 MED ORDER — FENTANYL CITRATE (PF) 100 MCG/2ML IJ SOLN
INTRAMUSCULAR | Status: DC | PRN
  Administered 2017-06-25: 50 ug via INTRAVENOUS
  Administered 2017-06-25: 25 ug via INTRAVENOUS

## 2017-06-25 MED ORDER — ROCURONIUM BROMIDE 100 MG/10ML IV SOLN
INTRAVENOUS | Status: DC | PRN
  Administered 2017-06-25: 10 mg via INTRAVENOUS
  Administered 2017-06-25: 40 mg via INTRAVENOUS

## 2017-06-25 MED ORDER — BUPIVACAINE LIPOSOME 1.3 % IJ SUSP
20.0000 mL | INTRAMUSCULAR | Status: AC
  Administered 2017-06-25: 15 mL
  Filled 2017-06-25: qty 20

## 2017-06-25 MED ORDER — HYDROMORPHONE HCL 1 MG/ML IJ SOLN
0.2500 mg | INTRAMUSCULAR | Status: DC | PRN
  Administered 2017-06-25 (×2): 0.5 mg via INTRAVENOUS

## 2017-06-25 MED ORDER — LACTATED RINGERS IV SOLN
INTRAVENOUS | Status: DC | PRN
  Administered 2017-06-25 (×2): via INTRAVENOUS

## 2017-06-25 MED ORDER — KETOROLAC TROMETHAMINE 15 MG/ML IJ SOLN
15.0000 mg | Freq: Once | INTRAMUSCULAR | Status: AC
  Administered 2017-06-25: 15 mg via INTRAVENOUS

## 2017-06-25 MED ORDER — FLEET ENEMA 7-19 GM/118ML RE ENEM: 1.0000 | ENEMA | Freq: Once | RECTAL | Status: DC | PRN

## 2017-06-25 MED ORDER — DESOXIMETASONE 0.05 % EX GEL: 1.0000 "application " | Freq: Every day | CUTANEOUS | Status: DC | PRN

## 2017-06-25 MED ORDER — FENTANYL CITRATE (PF) 250 MCG/5ML IJ SOLN
INTRAMUSCULAR | Status: AC
  Filled 2017-06-25: qty 5

## 2017-06-25 MED ORDER — PROPOFOL 10 MG/ML IV BOLUS
INTRAVENOUS | Status: AC
  Filled 2017-06-25: qty 20

## 2017-06-25 MED ORDER — PROMETHAZINE HCL 25 MG/ML IJ SOLN: 6.2500 mg | INTRAMUSCULAR | Status: DC | PRN

## 2017-06-25 MED ORDER — BUPIVACAINE HCL (PF) 0.5 % IJ SOLN
INTRAMUSCULAR | Status: AC
  Filled 2017-06-25: qty 30

## 2017-06-25 MED ORDER — MEPERIDINE HCL 50 MG/ML IJ SOLN: 6.2500 mg | INTRAMUSCULAR | Status: DC | PRN

## 2017-06-25 MED ORDER — CEFAZOLIN SODIUM-DEXTROSE 1-4 GM/50ML-% IV SOLN
1.0000 g | Freq: Two times a day (BID) | INTRAVENOUS | Status: AC
  Administered 2017-06-25 – 2017-06-26 (×2): 1 g via INTRAVENOUS
  Filled 2017-06-25 (×2): qty 50

## 2017-06-25 MED ORDER — DEXAMETHASONE SODIUM PHOSPHATE 10 MG/ML IJ SOLN
INTRAMUSCULAR | Status: AC
  Filled 2017-06-25: qty 1

## 2017-06-25 MED ORDER — LORATADINE 10 MG PO TABS
10.0000 mg | ORAL_TABLET | Freq: Every day | ORAL | Status: DC
  Administered 2017-06-26 – 2017-06-28 (×3): 10 mg via ORAL
  Filled 2017-06-25 (×3): qty 1

## 2017-06-25 MED ORDER — METOCLOPRAMIDE HCL 5 MG/ML IJ SOLN: 5.0000 mg | Freq: Three times a day (TID) | INTRAMUSCULAR | Status: DC | PRN

## 2017-06-25 MED ORDER — ROPIVACAINE HCL 5 MG/ML IJ SOLN
INTRAMUSCULAR | Status: DC | PRN
  Administered 2017-06-25: 30 mL via PERINEURAL

## 2017-06-25 MED ORDER — MIDAZOLAM HCL 2 MG/2ML IJ SOLN
INTRAMUSCULAR | Status: AC
  Filled 2017-06-25: qty 2

## 2017-06-25 MED ORDER — DM-GUAIFENESIN ER 30-600 MG PO TB12
1.0000 | ORAL_TABLET | Freq: Two times a day (BID) | ORAL | Status: DC
  Administered 2017-06-25 – 2017-06-28 (×6): 1 via ORAL
  Filled 2017-06-25 (×6): qty 1

## 2017-06-25 MED ORDER — PANTOPRAZOLE SODIUM 40 MG PO TBEC
DELAYED_RELEASE_TABLET | ORAL | Status: AC
  Administered 2017-06-25: 40 mg via ORAL
  Filled 2017-06-25: qty 1

## 2017-06-25 MED ORDER — HYDROCHLOROTHIAZIDE 25 MG PO TABS
25.0000 mg | ORAL_TABLET | Freq: Every morning | ORAL | Status: DC
  Administered 2017-06-26 – 2017-06-27 (×2): 25 mg via ORAL
  Filled 2017-06-25 (×3): qty 1

## 2017-06-25 MED ORDER — PHENYLEPHRINE 40 MCG/ML (10ML) SYRINGE FOR IV PUSH (FOR BLOOD PRESSURE SUPPORT)
PREFILLED_SYRINGE | INTRAVENOUS | Status: DC | PRN
  Administered 2017-06-25 (×2): 40 ug via INTRAVENOUS
  Administered 2017-06-25 (×5): 80 ug via INTRAVENOUS

## 2017-06-25 MED ORDER — DIPHENHYDRAMINE HCL 12.5 MG/5ML PO ELIX: 12.5000 mg | ORAL_SOLUTION | ORAL | Status: DC | PRN

## 2017-06-25 MED ORDER — HYDRALAZINE HCL 50 MG PO TABS
50.0000 mg | ORAL_TABLET | Freq: Two times a day (BID) | ORAL | Status: DC
  Administered 2017-06-25 – 2017-06-27 (×4): 50 mg via ORAL
  Filled 2017-06-25 (×6): qty 1

## 2017-06-25 MED ORDER — AMPICILLIN 500 MG PO CAPS
500.0000 mg | ORAL_CAPSULE | Freq: Three times a day (TID) | ORAL | Status: DC
  Administered 2017-06-26 – 2017-06-28 (×8): 500 mg via ORAL
  Filled 2017-06-25 (×8): qty 1

## 2017-06-25 MED ORDER — INSULIN ASPART 100 UNIT/ML ~~LOC~~ SOLN
0.0000 [IU] | Freq: Three times a day (TID) | SUBCUTANEOUS | Status: DC
  Administered 2017-06-25 – 2017-06-26 (×4): 3 [IU] via SUBCUTANEOUS
  Administered 2017-06-27 (×3): 1 [IU] via SUBCUTANEOUS

## 2017-06-25 MED ORDER — PANTOPRAZOLE SODIUM 40 MG PO TBEC
40.0000 mg | DELAYED_RELEASE_TABLET | Freq: Once | ORAL | Status: AC
  Administered 2017-06-25: 40 mg via ORAL

## 2017-06-25 MED ORDER — METHOCARBAMOL 500 MG PO TABS
ORAL_TABLET | ORAL | Status: AC
  Administered 2017-06-25: 500 mg via ORAL
  Filled 2017-06-25: qty 1

## 2017-06-25 MED ORDER — CLINDAMYCIN PHOSPHATE 2 % VA CREA: 1.0000 | TOPICAL_CREAM | Freq: Every day | VAGINAL | Status: DC | PRN

## 2017-06-25 MED ORDER — LIDOCAINE 2% (20 MG/ML) 5 ML SYRINGE
INTRAMUSCULAR | Status: DC | PRN
  Administered 2017-06-25: 40 mg via INTRAVENOUS

## 2017-06-25 MED ORDER — EPHEDRINE SULFATE 50 MG/ML IJ SOLN
INTRAMUSCULAR | Status: AC
  Filled 2017-06-25: qty 1

## 2017-06-25 MED ORDER — SUGAMMADEX SODIUM 200 MG/2ML IV SOLN
INTRAVENOUS | Status: DC | PRN
  Administered 2017-06-25: 150 mg via INTRAVENOUS

## 2017-06-25 MED ORDER — SODIUM CHLORIDE 0.9 % IR SOLN
Status: DC | PRN
  Administered 2017-06-25: 3000 mL

## 2017-06-25 MED ORDER — DEXAMETHASONE SODIUM PHOSPHATE 10 MG/ML IJ SOLN
INTRAMUSCULAR | Status: DC | PRN
  Administered 2017-06-25: 10 mg via INTRAVENOUS

## 2017-06-25 MED ORDER — MORPHINE SULFATE (PF) 2 MG/ML IV SOLN
0.5000 mg | INTRAVENOUS | Status: DC | PRN
  Administered 2017-06-27: 1 mg via INTRAVENOUS
  Filled 2017-06-25: qty 1

## 2017-06-25 MED ORDER — GABAPENTIN 300 MG PO CAPS
300.0000 mg | ORAL_CAPSULE | Freq: Three times a day (TID) | ORAL | Status: DC
  Administered 2017-06-25 – 2017-06-28 (×9): 300 mg via ORAL
  Filled 2017-06-25 (×9): qty 1

## 2017-06-25 MED ORDER — LACTATED RINGERS IV SOLN: INTRAVENOUS | Status: DC

## 2017-06-25 MED ORDER — GLYCOPYRROLATE 0.2 MG/ML IJ SOLN
INTRAMUSCULAR | Status: AC
  Administered 2017-06-25: 0.2 mg via INTRAVENOUS
  Filled 2017-06-25: qty 1

## 2017-06-25 MED ORDER — SUGAMMADEX SODIUM 200 MG/2ML IV SOLN
INTRAVENOUS | Status: AC
  Filled 2017-06-25: qty 2

## 2017-06-25 MED ORDER — MAGIC MOUTHWASH: 5.0000 mL | Freq: Four times a day (QID) | ORAL | Status: DC | PRN

## 2017-06-25 MED ORDER — GLYCOPYRROLATE PF 0.2 MG/ML IJ SOSY
PREFILLED_SYRINGE | INTRAMUSCULAR | Status: AC
  Filled 2017-06-25: qty 1

## 2017-06-25 MED ORDER — GLYCOPYRROLATE 0.2 MG/ML IJ SOLN
0.2000 mg | Freq: Once | INTRAMUSCULAR | Status: AC
  Administered 2017-06-25: 0.2 mg via INTRAVENOUS

## 2017-06-25 MED ORDER — BIOTIN 2500 MCG PO CAPS: 2500.0000 ug | ORAL_CAPSULE | Freq: Every day | ORAL | Status: DC

## 2017-06-25 MED ORDER — CARVEDILOL 25 MG PO TABS
25.0000 mg | ORAL_TABLET | Freq: Every day | ORAL | Status: DC
  Administered 2017-06-25 – 2017-06-27 (×2): 25 mg via ORAL
  Filled 2017-06-25 (×2): qty 1

## 2017-06-25 MED ORDER — CEFAZOLIN SODIUM-DEXTROSE 2-4 GM/100ML-% IV SOLN
2.0000 g | INTRAVENOUS | Status: AC
  Administered 2017-06-25: 2 g via INTRAVENOUS
  Filled 2017-06-25: qty 100

## 2017-06-25 MED ORDER — POLYETHYLENE GLYCOL 3350 17 G PO PACK: 17.0000 g | PACK | Freq: Every day | ORAL | Status: DC | PRN

## 2017-06-25 MED ORDER — METHOCARBAMOL 1000 MG/10ML IJ SOLN
500.0000 mg | Freq: Four times a day (QID) | INTRAVENOUS | Status: DC | PRN
  Filled 2017-06-25: qty 5

## 2017-06-25 MED ORDER — ONDANSETRON HCL 4 MG/2ML IJ SOLN
INTRAMUSCULAR | Status: DC | PRN
  Administered 2017-06-25: 4 mg via INTRAVENOUS

## 2017-06-25 MED ORDER — HYDROCODONE-ACETAMINOPHEN 7.5-325 MG PO TABS
1.0000 | ORAL_TABLET | ORAL | Status: DC | PRN
  Administered 2017-06-25 – 2017-06-26 (×5): 1 via ORAL
  Administered 2017-06-27: 2 via ORAL
  Administered 2017-06-27: 1 via ORAL
  Administered 2017-06-28: 2 via ORAL
  Filled 2017-06-25: qty 1
  Filled 2017-06-25 (×2): qty 2
  Filled 2017-06-25 (×4): qty 1

## 2017-06-25 MED ORDER — MENTHOL 3 MG MT LOZG: 1.0000 | LOZENGE | OROMUCOSAL | Status: DC | PRN

## 2017-06-25 MED ORDER — CHLORHEXIDINE GLUCONATE 4 % EX LIQD: 60.0000 mL | Freq: Once | CUTANEOUS | Status: DC

## 2017-06-25 MED ORDER — TRANEXAMIC ACID 1000 MG/10ML IV SOLN
1000.0000 mg | INTRAVENOUS | Status: AC
  Administered 2017-06-25: 1000 mg via INTRAVENOUS
  Filled 2017-06-25: qty 1100

## 2017-06-25 MED ORDER — ACETAMINOPHEN 325 MG PO TABS: 325.0000 mg | ORAL_TABLET | Freq: Four times a day (QID) | ORAL | Status: DC | PRN

## 2017-06-25 MED ORDER — ROCURONIUM BROMIDE 50 MG/5ML IV SOLN
INTRAVENOUS | Status: AC
  Filled 2017-06-25: qty 1

## 2017-06-25 MED ORDER — METHOCARBAMOL 500 MG PO TABS
500.0000 mg | ORAL_TABLET | Freq: Four times a day (QID) | ORAL | Status: DC | PRN
  Administered 2017-06-25 – 2017-06-26 (×2): 500 mg via ORAL
  Filled 2017-06-25: qty 1

## 2017-06-25 MED ORDER — PROPOFOL 10 MG/ML IV BOLUS
INTRAVENOUS | Status: DC | PRN
  Administered 2017-06-25: 120 mg via INTRAVENOUS

## 2017-06-25 MED ORDER — MIDAZOLAM HCL 5 MG/5ML IJ SOLN
INTRAMUSCULAR | Status: DC | PRN
  Administered 2017-06-25: 1 mg via INTRAVENOUS

## 2017-06-25 MED ORDER — PANTOPRAZOLE SODIUM 40 MG PO TBEC
40.0000 mg | DELAYED_RELEASE_TABLET | Freq: Every day | ORAL | Status: DC
  Administered 2017-06-25 – 2017-06-28 (×4): 40 mg via ORAL
  Filled 2017-06-25 (×4): qty 1

## 2017-06-25 MED ORDER — LOSARTAN POTASSIUM 50 MG PO TABS
50.0000 mg | ORAL_TABLET | Freq: Every day | ORAL | Status: DC
  Administered 2017-06-25 – 2017-06-27 (×2): 50 mg via ORAL
  Filled 2017-06-25 (×2): qty 1

## 2017-06-25 MED ORDER — EPHEDRINE SULFATE-NACL 50-0.9 MG/10ML-% IV SOSY
PREFILLED_SYRINGE | INTRAVENOUS | Status: DC | PRN
  Administered 2017-06-25 (×5): 10 mg via INTRAVENOUS

## 2017-06-25 MED ORDER — PHENOL 1.4 % MT LIQD: 1.0000 | OROMUCOSAL | Status: DC | PRN

## 2017-06-25 MED ORDER — HYDROCODONE-ACETAMINOPHEN 7.5-325 MG PO TABS
ORAL_TABLET | ORAL | Status: AC
  Administered 2017-06-25: 1 via ORAL
  Filled 2017-06-25: qty 1

## 2017-06-25 MED ORDER — ONDANSETRON HCL 4 MG/2ML IJ SOLN
INTRAMUSCULAR | Status: AC
  Filled 2017-06-25: qty 2

## 2017-06-25 MED ORDER — GLYCOPYRROLATE PF 0.2 MG/ML IJ SOSY
PREFILLED_SYRINGE | INTRAMUSCULAR | Status: DC | PRN
  Administered 2017-06-25: .2 mg via INTRAVENOUS

## 2017-06-25 MED ORDER — KETOROLAC TROMETHAMINE 15 MG/ML IJ SOLN
INTRAMUSCULAR | Status: AC
  Filled 2017-06-25: qty 1

## 2017-06-25 MED ORDER — SODIUM CHLORIDE 0.9 % IV SOLN
INTRAVENOUS | Status: DC
  Administered 2017-06-26 – 2017-06-27 (×2): via INTRAVENOUS

## 2017-06-25 MED ORDER — BUPIVACAINE HCL 0.5 % IJ SOLN
INTRAMUSCULAR | Status: DC | PRN
  Administered 2017-06-25: 15 mL

## 2017-06-25 MED ORDER — ATORVASTATIN CALCIUM 20 MG PO TABS
20.0000 mg | ORAL_TABLET | ORAL | Status: DC
  Administered 2017-06-25 – 2017-06-27 (×2): 20 mg via ORAL
  Filled 2017-06-25 (×2): qty 1

## 2017-06-25 MED ORDER — BISACODYL 10 MG RE SUPP: 10.0000 mg | Freq: Every day | RECTAL | Status: DC | PRN

## 2017-06-25 MED ORDER — LIDOCAINE 2% (20 MG/ML) 5 ML SYRINGE
INTRAMUSCULAR | Status: AC
  Filled 2017-06-25: qty 5

## 2017-06-25 MED ORDER — VITAMIN D 1000 UNITS PO TABS
1000.0000 [IU] | ORAL_TABLET | ORAL | Status: DC
  Administered 2017-06-25 – 2017-06-27 (×2): 1000 [IU] via ORAL
  Filled 2017-06-25 (×2): qty 1

## 2017-06-25 MED ORDER — PHENYLEPHRINE 40 MCG/ML (10ML) SYRINGE FOR IV PUSH (FOR BLOOD PRESSURE SUPPORT)
PREFILLED_SYRINGE | INTRAVENOUS | Status: AC
  Filled 2017-06-25: qty 20

## 2017-06-25 MED ORDER — POTASSIUM CHLORIDE CRYS ER 20 MEQ PO TBCR: 20.0000 meq | EXTENDED_RELEASE_TABLET | Freq: Every day | ORAL | Status: DC | PRN

## 2017-06-25 MED ORDER — METOCLOPRAMIDE HCL 5 MG PO TABS: 5.0000 mg | ORAL_TABLET | Freq: Three times a day (TID) | ORAL | Status: DC | PRN

## 2017-06-25 MED ORDER — FUROSEMIDE 40 MG PO TABS: 40.0000 mg | ORAL_TABLET | Freq: Every day | ORAL | Status: DC | PRN

## 2017-06-25 SURGICAL SUPPLY — 67 items
BANDAGE ACE 4X5 VEL STRL LF (GAUZE/BANDAGES/DRESSINGS) ×3 IMPLANT
BANDAGE ESMARK 6X9 LF (GAUZE/BANDAGES/DRESSINGS) ×1 IMPLANT
BENZOIN TINCTURE PRP APPL 2/3 (GAUZE/BANDAGES/DRESSINGS) ×3 IMPLANT
BLADE SAG 18X100X1.27 (BLADE) ×6 IMPLANT
BLADE SAW SGTL 13X75X1.27 (BLADE) ×3 IMPLANT
BNDG ELASTIC 6X10 VLCR STRL LF (GAUZE/BANDAGES/DRESSINGS) ×3 IMPLANT
BNDG ESMARK 6X9 LF (GAUZE/BANDAGES/DRESSINGS) ×3
BOWL SMART MIX CTS (DISPOSABLE) ×3 IMPLANT
CAPT KNEE TOTAL 3 ATTUNE ×3 IMPLANT
CEMENT HV SMART SET (Cement) ×6 IMPLANT
CLOSURE WOUND 1/2 X4 (GAUZE/BANDAGES/DRESSINGS) ×2
COVER SURGICAL LIGHT HANDLE (MISCELLANEOUS) ×3 IMPLANT
CUFF TOURNIQUET SINGLE 34IN LL (TOURNIQUET CUFF) ×3 IMPLANT
CUFF TOURNIQUET SINGLE 44IN (TOURNIQUET CUFF) IMPLANT
DRAPE ORTHO SPLIT 77X108 STRL (DRAPES) ×4
DRAPE SURG ORHT 6 SPLT 77X108 (DRAPES) ×2 IMPLANT
DRAPE U-SHAPE 47X51 STRL (DRAPES) ×3 IMPLANT
DRSG ADAPTIC 3X8 NADH LF (GAUZE/BANDAGES/DRESSINGS) ×3 IMPLANT
DRSG PAD ABDOMINAL 8X10 ST (GAUZE/BANDAGES/DRESSINGS) ×3 IMPLANT
DURAPREP 26ML APPLICATOR (WOUND CARE) ×3 IMPLANT
ELECT REM PT RETURN 9FT ADLT (ELECTROSURGICAL) ×3
ELECTRODE REM PT RTRN 9FT ADLT (ELECTROSURGICAL) ×1 IMPLANT
EVACUATOR 1/8 PVC DRAIN (DRAIN) IMPLANT
FACESHIELD WRAPAROUND (MASK) ×6 IMPLANT
GAUZE SPONGE 4X4 12PLY STRL (GAUZE/BANDAGES/DRESSINGS) ×3 IMPLANT
GLOVE BIOGEL PI IND STRL 8 (GLOVE) ×1 IMPLANT
GLOVE BIOGEL PI INDICATOR 8 (GLOVE) ×2
GLOVE ECLIPSE 9.0 STRL (GLOVE) ×3 IMPLANT
GLOVE ORTHO TXT STRL SZ7.5 (GLOVE) ×3 IMPLANT
GLOVE SURG 8.5 LATEX PF (GLOVE) ×3 IMPLANT
GOWN STRL REUS W/ TWL LRG LVL3 (GOWN DISPOSABLE) ×1 IMPLANT
GOWN STRL REUS W/TWL 2XL LVL3 (GOWN DISPOSABLE) ×6 IMPLANT
GOWN STRL REUS W/TWL LRG LVL3 (GOWN DISPOSABLE) ×2
HANDPIECE INTERPULSE COAX TIP (DISPOSABLE) ×2
IMMOBILIZER KNEE 20 (SOFTGOODS) ×3
IMMOBILIZER KNEE 20 THIGH 36 (SOFTGOODS) ×1 IMPLANT
IMMOBILIZER KNEE 22 UNIV (SOFTGOODS) IMPLANT
KIT BASIN OR (CUSTOM PROCEDURE TRAY) ×3 IMPLANT
KIT TURNOVER KIT B (KITS) ×3 IMPLANT
MANIFOLD NEPTUNE II (INSTRUMENTS) ×3 IMPLANT
NEEDLE HYPO 25X1 1.5 SAFETY (NEEDLE) IMPLANT
NS IRRIG 1000ML POUR BTL (IV SOLUTION) ×3 IMPLANT
PACK TOTAL JOINT (CUSTOM PROCEDURE TRAY) ×3 IMPLANT
PAD ARMBOARD 7.5X6 YLW CONV (MISCELLANEOUS) ×6 IMPLANT
PAD CAST 4YDX4 CTTN HI CHSV (CAST SUPPLIES) ×1 IMPLANT
PADDING CAST COTTON 4X4 STRL (CAST SUPPLIES) ×2
PADDING CAST COTTON 6X4 STRL (CAST SUPPLIES) ×3 IMPLANT
SET HNDPC FAN SPRY TIP SCT (DISPOSABLE) ×1 IMPLANT
SPONGE SURGIFOAM ABS GEL 100C (HEMOSTASIS) ×3 IMPLANT
STAPLER VISISTAT 35W (STAPLE) IMPLANT
STRIP CLOSURE SKIN 1/2X4 (GAUZE/BANDAGES/DRESSINGS) ×4 IMPLANT
SUCTION FRAZIER HANDLE 10FR (MISCELLANEOUS)
SUCTION TUBE FRAZIER 10FR DISP (MISCELLANEOUS) IMPLANT
SUT BONE WAX W31G (SUTURE) IMPLANT
SUT VIC AB 0 CT1 27 (SUTURE) ×4
SUT VIC AB 0 CT1 27XBRD ANBCTR (SUTURE) ×2 IMPLANT
SUT VIC AB 1 CT1 27 (SUTURE) ×4
SUT VIC AB 1 CT1 27XBRD ANBCTR (SUTURE) ×2 IMPLANT
SUT VIC AB 2-0 CT1 27 (SUTURE) ×6
SUT VIC AB 2-0 CT1 TAPERPNT 27 (SUTURE) ×3 IMPLANT
SUT VICRYL 4-0 PS2 18IN ABS (SUTURE) ×3 IMPLANT
SYR CONTROL 10ML LL (SYRINGE) IMPLANT
TOWEL OR 17X24 6PK STRL BLUE (TOWEL DISPOSABLE) ×3 IMPLANT
TOWEL OR 17X26 10 PK STRL BLUE (TOWEL DISPOSABLE) ×3 IMPLANT
TRAY CATH 16FR W/PLASTIC CATH (SET/KITS/TRAYS/PACK) IMPLANT
TRAY FOLEY CATH SILVER 16FR (SET/KITS/TRAYS/PACK) IMPLANT
WATER STERILE IRR 1000ML POUR (IV SOLUTION) ×3 IMPLANT

## 2017-06-25 NOTE — Transfer of Care (Signed)
Immediate Anesthesia Transfer of Care Note  Patient: Angelica French  Procedure(s) Performed: RIGHT TOTAL KNEE ARTHROPLASTY (Right Knee)  Patient Location: PACU  Anesthesia Type:General and Regional  Level of Consciousness: drowsy  Airway & Oxygen Therapy: Patient Spontanous Breathing and Patient connected to face mask oxygen  Post-op Assessment: Report given to RN and Post -op Vital signs reviewed and stable  Post vital signs: Reviewed and stable  Last Vitals:  Vitals Value Taken Time  BP 121/57 06/25/2017 10:22 AM  Temp    Pulse 56 06/25/2017 10:24 AM  Resp 26 06/25/2017 10:24 AM  SpO2 100 % 06/25/2017 10:24 AM  Vitals shown include unvalidated device data.  Last Pain:  Vitals:   06/25/17 0629  TempSrc: Oral  PainSc:       Patients Stated Pain Goal: 1 (40/90/50 2561)  Complications: No apparent anesthesia complications

## 2017-06-25 NOTE — Discharge Instructions (Addendum)
Keep knee incision dry for 5 days post op then may wet while bathing. Therapy daily and CPM goal full extension and greater than 90 degrees flexion. Call if fever or chills or increased drainage. Go to ER if acutely short of breath or call for ambulance. Return for follow up in 2 weeks. May full weight bear on the surgical leg unless told otherwise. Use knee immobilizer until able to straight leg raise off bed with knee stable. In house walking for first 2 weeks.  Follow up with urology in one week for a voiding trial, Dr. Zannie Cove.   INSTRUCTIONS AFTER JOINT REPLACEMENT   o Remove items at home which could result in a fall. This includes throw rugs or furniture in walking pathways o ICE to the affected joint every three hours while awake for 30 minutes at a time, for at least the first 3-5 days, and then as needed for pain and swelling.  Continue to use ice for pain and swelling. You may notice swelling that will progress down to the foot and ankle.  This is normal after surgery.  Elevate your leg when you are not up walking on it.   o Continue to use the breathing machine you got in the hospital (incentive spirometer) which will help keep your temperature down.  It is common for your temperature to cycle up and down following surgery, especially at night when you are not up moving around and exerting yourself.  The breathing machine keeps your lungs expanded and your temperature down.   DIET:  As you were doing prior to hospitalization, we recommend a well-balanced diet.  DRESSING / WOUND CARE / SHOWERING  You may change your dressing 3-5 days after surgery.  Then change the dressing every day with sterile gauze.  Please use good hand washing techniques before changing the dressing.  Do not use any lotions or creams on the incision until instructed by your surgeon.  ACTIVITY  o Increase activity slowly as tolerated, but follow the weight bearing instructions below.   o No driving  for 6 weeks or until further direction given by your physician.  You cannot drive while taking narcotics.  o No lifting or carrying greater than 10 lbs. until further directed by your surgeon. o Avoid periods of inactivity such as sitting longer than an hour when not asleep. This helps prevent blood clots.  o You may return to work once you are authorized by your doctor.     WEIGHT BEARING   Weight bearing as tolerated with assist device (walker, cane, etc) as directed, use it as long as suggested by your surgeon or therapist, typically at least 4-6 weeks.   EXERCISES  Results after joint replacement surgery are often greatly improved when you follow the exercise, range of motion and muscle strengthening exercises prescribed by your doctor. Safety measures are also important to protect the joint from further injury. Any time any of these exercises cause you to have increased pain or swelling, decrease what you are doing until you are comfortable again and then slowly increase them. If you have problems or questions, call your caregiver or physical therapist for advice.   Rehabilitation is important following a joint replacement. After just a few days of immobilization, the muscles of the leg can become weakened and shrink (atrophy).  These exercises are designed to build up the tone and strength of the thigh and leg muscles and to improve motion. Often times heat used for twenty to  thirty minutes before working out will loosen up your tissues and help with improving the range of motion but do not use heat for the first two weeks following surgery (sometimes heat can increase post-operative swelling).   These exercises can be done on a training (exercise) mat, on the floor, on a table or on a bed. Use whatever works the best and is most comfortable for you.    Use music or television while you are exercising so that the exercises are a pleasant break in your day. This will make your life better with  the exercises acting as a break in your routine that you can look forward to.   Perform all exercises about fifteen times, three times per day or as directed.  You should exercise both the operative leg and the other leg as well.  Exercises include:    Quad Sets - Tighten up the muscle on the front of the thigh (Quad) and hold for 5-10 seconds.    Straight Leg Raises - With your knee straight (if you were given a brace, keep it on), lift the leg to 60 degrees, hold for 3 seconds, and slowly lower the leg.  Perform this exercise against resistance later as your leg gets stronger.   Leg Slides: Lying on your back, slowly slide your foot toward your buttocks, bending your knee up off the floor (only go as far as is comfortable). Then slowly slide your foot back down until your leg is flat on the floor again.   Angel Wings: Lying on your back spread your legs to the side as far apart as you can without causing discomfort.   Hamstring Strength:  Lying on your back, push your heel against the floor with your leg straight by tightening up the muscles of your buttocks.  Repeat, but this time bend your knee to a comfortable angle, and push your heel against the floor.  You may put a pillow under the heel to make it more comfortable if necessary.   A rehabilitation program following joint replacement surgery can speed recovery and prevent re-injury in the future due to weakened muscles. Contact your doctor or a physical therapist for more information on knee rehabilitation.    CONSTIPATION  Constipation is defined medically as fewer than three stools per week and severe constipation as less than one stool per week.  Even if you have a regular bowel pattern at home, your normal regimen is likely to be disrupted due to multiple reasons following surgery.  Combination of anesthesia, postoperative narcotics, change in appetite and fluid intake all can affect your bowels.   YOU MUST use at least one of the  following options; they are listed in order of increasing strength to get the job done.  They are all available over the counter, and you may need to use some, POSSIBLY even all of these options:    Drink plenty of fluids (prune juice may be helpful) and high fiber foods Colace 100 mg by mouth twice a day  Senokot for constipation as directed and as needed Dulcolax (bisacodyl), take with full glass of water  Miralax (polyethylene glycol) once or twice a day as needed.  If you have tried all these things and are unable to have a bowel movement in the first 3-4 days after surgery call either your surgeon or your primary doctor.    If you experience loose stools or diarrhea, hold the medications until you stool forms back up.  If your  symptoms do not get better within 1 week or if they get worse, check with your doctor.  If you experience "the worst abdominal pain ever" or develop nausea or vomiting, please contact the office immediately for further recommendations for treatment.   ITCHING:  If you experience itching with your medications, try taking only a single pain pill, or even half a pain pill at a time.  You can also use Benadryl over the counter for itching or also to help with sleep.   TED HOSE STOCKINGS:  Use stockings on both legs until for at least 2 weeks or as directed by physician office. They may be removed at night for sleeping.  MEDICATIONS:  See your medication summary on the After Visit Summary that nursing will review with you.  You may have some home medications which will be placed on hold until you complete the course of blood thinner medication.  It is important for you to complete the blood thinner medication as prescribed.  PRECAUTIONS:  If you experience chest pain or shortness of breath - call 911 immediately for transfer to the hospital emergency department.   If you develop a fever greater that 101 F, purulent drainage from wound, increased redness or drainage from  wound, foul odor from the wound/dressing, or calf pain - CONTACT YOUR SURGEON.                                                   FOLLOW-UP APPOINTMENTS:  If you do not already have a post-op appointment, please call the office for an appointment to be seen by your surgeon.  Guidelines for how soon to be seen are listed in your After Visit Summary, but are typically between 1-4 weeks after surgery.  OTHER INSTRUCTIONS:   Knee Replacement:  Do not place pillow under knee, focus on keeping the knee straight while resting. CPM instructions: 0-90 degrees, 2 hours in the morning, 2 hours in the afternoon, and 2 hours in the evening. Place foam block, curve side up under heel at all times except when in CPM or when walking.  DO NOT modify, tear, cut, or change the foam block in any way.  MAKE SURE YOU:   Understand these instructions.   Get help right away if you are not doing well or get worse.    Thank you for letting us be a part of your medical care team.  It is a privilege we respect greatly.  We hope these instructions will help you stay on track for a fast and full recovery!

## 2017-06-25 NOTE — Brief Op Note (Signed)
06/25/2017  9:57 AM  PATIENT:  Angelica French  77 y.o. female  PRE-OPERATIVE DIAGNOSIS:  severe osteoarthritis right knee  POST-OPERATIVE DIAGNOSIS:  severe osteoarthritis right knee  PROCEDURE:  Procedure(s): RIGHT TOTAL KNEE ARTHROPLASTY (Right)  SURGEON:  Surgeon(s) and Role:    Jessy Oto, MD - Primary  PHYSICIAN ASSISTANT:Daymien Goth Ricard Dillon.PA-C   ANESTHESIA:   local, regional and general, Dr. Suella Broad  EBL:  100 mL   BLOOD ADMINISTERED:none  DRAINS: Urinary Catheter (Foley)   LOCAL MEDICATIONS USED:  MARCAINE 0.5% 1:1 EXPAREL 1.3% Amount: 30 ml  SPECIMEN:  No Specimen  DISPOSITION OF SPECIMEN:  N/A  COUNTS:  YES  TOURNIQUET:   Total Tourniquet Time Documented: Thigh (Right) - 66 minutes Total: Thigh (Right) - 66 minutes   DICTATION: .Viviann Spare Dictation  PLAN OF CARE: Admit to inpatient   PATIENT DISPOSITION:  PACU - hemodynamically stable.   Delay start of Pharmacological VTE agent (>24hrs) due to surgical blood loss or risk of bleeding: no

## 2017-06-25 NOTE — Anesthesia Procedure Notes (Signed)
Procedure Name: Intubation Date/Time: 06/25/2017 7:47 AM Performed by: Gwyndolyn Saxon, CRNA Pre-anesthesia Checklist: Patient identified, Emergency Drugs available, Suction available, Patient being monitored and Timeout performed Patient Re-evaluated:Patient Re-evaluated prior to induction Oxygen Delivery Method: Circle system utilized Preoxygenation: Pre-oxygenation with 100% oxygen Induction Type: IV induction Ventilation: Mask ventilation without difficulty Laryngoscope Size: Miller and 2 Grade View: Grade I Tube type: Oral Tube size: 7.0 mm Number of attempts: 1 Placement Confirmation: ETT inserted through vocal cords under direct vision,  positive ETCO2,  CO2 detector and breath sounds checked- equal and bilateral Secured at: 20 cm Tube secured with: Tape Dental Injury: Teeth and Oropharynx as per pre-operative assessment

## 2017-06-25 NOTE — Anesthesia Postprocedure Evaluation (Signed)
Anesthesia Post Note  Patient: Angelica French  Procedure(s) Performed: RIGHT TOTAL KNEE ARTHROPLASTY (Right Knee)     Patient location during evaluation: PACU Anesthesia Type: General Level of consciousness: awake and alert Pain management: pain level controlled Vital Signs Assessment: post-procedure vital signs reviewed and stable Respiratory status: spontaneous breathing, nonlabored ventilation, respiratory function stable and patient connected to nasal cannula oxygen Cardiovascular status: blood pressure returned to baseline and stable Postop Assessment: no apparent nausea or vomiting Anesthetic complications: no Comments: Episodes of bradycardia (40's), normal mentation, no symptoms, normal blood pressure. Pain better controlled. Occasional desaturations when sleep, will continue monitoring on floor.     Last Vitals:  Vitals:   06/25/17 1315 06/25/17 1325  BP: (!) 153/63 (!) 158/60  Pulse: (!) 42 (!) 43  Resp: 11 11  Temp:    SpO2: 100% 100%    Last Pain:  Vitals:   06/25/17 1325  TempSrc:   PainSc: [name]

## 2017-06-25 NOTE — Op Note (Signed)
06/25/2017  10:00 AM  PATIENT:  Angelica French  77 y.o. female  MRN: [id number]  OPERATIVE REPORT   PRE-OPERATIVE DIAGNOSIS:  severe osteoarthritis right knee  POST-OPERATIVE DIAGNOSIS:  severe osteoarthritis right knee  PROCEDURE:  Procedure(s): RIGHT TOTAL KNEE ARTHROPLASTY    SURGEON: [name], MD     ASSISTANT:  Benjiman Core, PA-C  (Present throughout the entire procedure and necessary for completion of procedure in a timely manner)     ANESTHESIA:  General with supplemental Adductor canal block. Supplemented with local marcaine 0.5% 1:1 exparel 1.3% total 30cc. Dr. Suella Broad    COMPLICATIONS:  None.  DRAINS: Foley to SD.   EBL: 100 CC  TOURNIQUET TIME:  66 minutes at 275 mm Hg.      COMPONENTS:  DePuy Attune cemented total knee system.  A #3  femoral component.  A #3 tibial tray with a 25m polyethylene RP tibial spacer , a 35 mm polyethylene patella.    Implant Name Type Inv. Item Serial No. Manufacturer Lot No. LRB No. Used  CEMENT HV SMART SET - [id number] CEMENT HV SMART SET  DEPUY SYNTHES [id number] 1  CEMENT HV SMART SET - [id number] CEMENT HV SMART SET  DEPUY SYNTHES [id number] 1  ATTUNE PS FEM RT SZ 3 CEM KNEE - [id number] ATTUNE PS FEM RT SZ 3 CEM KNEE  DEPUY SYNTHES [id number] 1  TIBIAL BASE ROT PLAT SZ 3 KNEE - [id number] TIBIAL BASE ROT PLAT SZ 3 KNEE  DEPUY [employer]TH[employer] [id number] 1  PATELLA MEDIAL ATTUN 35MM KNEE - [id number] PATELLA MEDIAL ATTUN 35MM KNEE  DEPUY SYNTHES [id number] 1  ATTUNE PSRP INSR SZ3 5MM KNEE - [id number] ATTUNE PSRP INSR SZ3 5MM KNEE  DEPUY SYNTHES [id number] 1     PROCEDURE:The patient was met in the holding area, and the appropriate right knee identified and marked with "X" and my initials. The patient did not received a preoperative femoral nerve block by anesthesia.  The patient was then transported to OR and was placed on the operative table in a supine position. The  patient was then placed under general anesthesia without difficulty. The patient received appropriate preoperative antibiotic prophylaxis ancef. The patien'ts right knee was examined under anesthesia and shown to have 0-130 range of motion,varus deformity, ligamentously stable, and normal patella tracking.Tourniquet was applied to the operative right thigh. Leg was then prepped using sterile conditions and draped using sterile technique. Time-out procedure was called and correct right knee identified.  The right leg was elevated and Esmarch exsanguinated with a thigh  tourniquet elevated ot 275 mmHg.  Initially, through a 13-cm longitudinal incision based over the patella initial exposure was made. The underlying subcutaneous tissues were incised along with the skin incision. A median arthrotomy was performed revealing an excessive amount of normal-appearing joint fluid, The articular surfaces were inspected. The patient had grade 4 changes medially, grade 3 changes laterally, and grade 4 changes in the right patellofemoral joint. Osteophytes were removed from the femoral condyles and the tibial plateau. The medial and lateral meniscal remnants were removed as well as the anterior cruciate ligament.  Attention then turned to the femur where the right knee was flexed to 90 degrees and an intramedullary guide placed a drill hole placed just anterior 4 mm to the intercondylar notch on the distal femur. The distal cutting jig then attached to the intramedulllay nail 5 degrees of valgus for the right knee. A planned  9 mm to be removed off the most prominent condyle, medial in this case. The jig pinned in place and the distal femoral cut performed. Anterior bow of the femur measured at 0. Based on this then the amount of proximal tibial cut was determined to be in line with off the depressed medial side of the joint 88m. Using the proximal tibial cutting jig with the leg alignment guide the jig was pinned to the  proximal tibia. 3 pins were used 0 degrees of posterior slope.Tibia then subluxed and proximal tibial cut was then performed. Soft tissue tensioning then examined and found to be well balanced in extension. #3 femoral component chosen. The trial placed against the end of the femur and felt to be a good fit. Distal right femur cutting jig was then carefully positioned on the distal femur using the end guide and placing 2 pins in the anterior end of the cut distal femur. 2 threaded pins used to pin the jig in place Rotation guide was then corrected the alignment 2 pins placed over the distal cut surface of the femur for placement of the jig for the chamfer cuts and coronal cuts. This is a carefully placed and held in place with threaded pins. Protecting soft tissues then the anterior cut was made after first verifying with the wing depth of the cut. Then the posterior coronal cuts protect soft tissue structures posteriorly. Posterior chamfer cut and anterior chamfer cut. These were done without difficulty. Cutting guide removed and box cutting jig was then applied to the distal femur carefully aligned and pinned into place. Box cut was then performed from the distal femur intercondylar box. Proximal tibia was then subluxed the posterior cruciate resected. A #3 plate and attached to the transverse cut surface of the proximal tibia using 2 pins. The upright for the reamer was then applied and reaming carried out for the keel for the tibial component. Using the osteotome was then inserted and impacted into place the temporary fixation pins removed from the proximal tibial plate. Femoral component was inserted using a trial component. Then a 5.0 mm insert placed and the knee brought into full extension full extension to 0and 1.5hyperextension was possible full flexion to 135without difficulty or lift off. The components were accepted and the permanent #3 femoral and tibial components chosen and brought onto the  field. Patella was observed to be a width anterior to posterior 21 mm and 7.5 mm resection guide left the residual 12 mm was allowed for the patella component. The cutting jig was then adjusted to allow for 12 mm remaining width. Applied and then the coronal cut the posterior aspect of the patella was performed. Lateral facet of patella showed very little resection. The trial 35 mm patella was chosen based on the size of the cut surface of patella. The 35 mm drill plate applied and drill holes placed through the posterior aspect of the patella. Trial reduction with the 35 mm trial and the leg placed through range of motion showed excellent motion full extension and flexion 135patella tendency to elevate medially was clamped in place over the medial retinaculum patellar showed normal appearance with flexion extension. No shuck noted then the knee was stable to varus and valgus at 0 30and 60. Expose were then removed and the knee was then irrigated with copious amounts of. Solution. A 35 mm permanent patella prosthesis also brought to the field. The distal femoral lug holes were drilled. Cement was mixed the knee was subluxed and  carefully soft tissues protected note that the lug holes in the distal femur where drill using the trial prosthesis and with the drill guide provided. With irrigation completed and permanent tibial components were then inserted into place using cement and a semi-putty state over the proximal aspect of the tibia cement was also applied to the deep surface of the tibial component as well as over the posterior runners of the femur and the deep surface of the patella component. These were then carefully place excess cement removed about the circumference of the tibial tray the femur was then placed applying cement to the cut it distal aspect of the femur and posterior chamfer area anterior chamfer and anterior coronal cut surface small amount within the box. Femoral component was then  impacted into place excess cement removed amounts of circumference including the posterior chamfer area and the posterior femoral condyle area. The trial tibial peg then placed in the tibial component and a 5.0 mm rotating platform trial was inserted knee brought into full extension observed on the appeared to be in 2of valgus with full extension 1of hyperextension. Cement was then applied to the posterior cut surface of the patella within each of the patella peg opening was then pin holes were placed for the cementing along the lateral aspect of the patella. Patella component was then carefully aligned and inserted over the posterior aspect of the patella and clamped in place excess cement was then resected circumferentially using scalpel in order for her to preserve cement the lateral facet area. Cement was allowed to fully harden tourniquet was released while cement was nearly completely hardened. Following this then irrigation was carried down careful inspection demonstrated some small bleeders present over the lateral posterior aspect of the knee joint cauterized using electrocautery off to the medial aspect and the areas of geniculate arteries. There is no active bleeding present then at that time. Trial 5.0 mm tibial insert demonstrated stability of the anterior drawer and negative and the knee stable to varus valgus stress and 30 and 15fexion and full extension. A 5.0 mm rotating platform insert was then chosen this was applied to the tibia using the permanent implant removing the trial examining tibia and determined there was no evident residual cement remaining. Also examined posterior runners I posterior aspect of the femoral condyles for any residual cement none was present. The tibial insert in place the reduced placed through range of motion and full extension flexion and 30knee stable to varus stress at 0 30 and 60anterior drawer negative. This component was accepted.Tourniquet released at  66 minutes. The right lateral posterior knee joint with mild bloody drainage likely due to cut bone surface, no active bleeding or coaguable vessel noted. A drain was not necessary the synovium reapproximated over the medial aspect of the knee with 0 Vicryl sutures.  The retinaculum of the knee and the quadriceps tendon were then reapproximated with interrupted #1 Vicryl sutures. Peritenon of the reapproximated with interrupted 0 Vicryl sutures deep subcutaneous layers approximated with interrupted 0 and 2-0 Vicryl sutures and the skin closed with stainless steel stables. Dermabond was then applied to an abrasion over the medial right anterior knee < 1cm. 30 cc of marcaine 1/2%1:1 exparel 1.3%instilled into the right knee. Adaptic 4 x 4's ABDs pads fixed to the skin with sterile labrum an Ace wrap applied from the foot to the right upper thigh and knee immobilizer. All instrument and sponge counts were correct. Then reactivated extubated and returned to recovery room in  satisfactory condition.  Benjiman Core, PA-C perform the duties of assistant surgeon she performed careful retraction of soft tissues assisted in addition the patient had removal on the OR table is present beginning the case the case and performed closure of the incision from the peritenon to the skin and application of dressing.    [name] 06/25/2017, 10:00 AM

## 2017-06-25 NOTE — Progress Notes (Signed)
Preop Urinalysis showed 50 WBC per hpf consistent with  UTI. Dr. Bonne Dolores offic contacted and she was started on Oral antibiotics on Wed and changed Thursday 6/20  to ampicillin 500 mg every 6 hours. She has had a adequate preoperative treatment. Will receive intraop antibiotics and will give post op ampicillin for the full course of the treatment 3 more days. jen Discussed with this patient.

## 2017-06-25 NOTE — Anesthesia Procedure Notes (Signed)
Anesthesia Regional Block: Adductor canal block   Pre-Anesthetic Checklist: ,, timeout performed, Correct Patient, Correct Site, Correct Laterality, Correct Procedure, Correct Position, site marked, Risks and benefits discussed,  Surgical consent,  Pre-op evaluation,  At surgeon's request and post-op pain management  Laterality: Right  Prep: chloraprep       Needles:  Injection technique: Single-shot  Needle Type: Echogenic Needle     Needle Length: 9cm  Needle Gauge: 21     Additional Needles:   Procedures:,,,, ultrasound used (permanent image in chart),,,,  Narrative:  Start time: 06/25/2017 6:55 AM End time: 06/25/2017 7:05 AM Injection made incrementally with aspirations every 5 mL.  Performed by: Personally  Anesthesiologist: Effie Berkshire, MD  Additional Notes: Patient tolerated the procedure well. Local anesthetic introduced in an incremental fashion under minimal resistance after negative aspirations. No paresthesias were elicited. After completion of the procedure, no acute issues were identified and patient continued to be monitored by RN.

## 2017-06-25 NOTE — Interval H&P Note (Signed)
History and Physical Interval Note:  06/25/2017 7:21 AM  Angelica French  has presented today for surgery, with the diagnosis of severe osteoarthritis right knee  The various methods of treatment have been discussed with the patient and family. After consideration of risks, benefits and other options for treatment, the patient has consented to  Procedure(s): RIGHT TOTAL KNEE ARTHROPLASTY (Right) as a surgical intervention .  The patient's history has been reviewed, patient examined, no change in status, stable for surgery.  I have reviewed the patient's chart and labs.  Questions were answered to the patient's satisfaction.     [name]

## 2017-06-26 ENCOUNTER — Encounter (HOSPITAL_COMMUNITY): Payer: Self-pay | Admitting: General Practice

## 2017-06-26 ENCOUNTER — Other Ambulatory Visit: Payer: Self-pay

## 2017-06-26 LAB — CBC
HCT: 30.5 % — ABNORMAL LOW (ref 36.0–46.0)
Hemoglobin: 9.5 g/dL — ABNORMAL LOW (ref 12.0–15.0)
MCH: 26.8 pg (ref 26.0–34.0)
MCHC: 31.1 g/dL (ref 30.0–36.0)
MCV: 85.9 fL (ref 78.0–100.0)
PLATELETS: 160 10*3/uL (ref 150–400)
RBC: 3.55 MIL/uL — ABNORMAL LOW (ref 3.87–5.11)
RDW: 15.2 % (ref 11.5–15.5)
WBC: 10.7 10*3/uL — AB (ref 4.0–10.5)

## 2017-06-26 LAB — BASIC METABOLIC PANEL
Anion gap: 8 (ref 5–15)
BUN: 32 mg/dL — ABNORMAL HIGH (ref 8–23)
CO2: 21 mmol/L — ABNORMAL LOW (ref 22–32)
Calcium: 8.6 mg/dL — ABNORMAL LOW (ref 8.9–10.3)
Chloride: 106 mmol/L (ref 98–111)
Creatinine, Ser: 1.33 mg/dL — ABNORMAL HIGH (ref 0.44–1.00)
GFR, EST AFRICAN AMERICAN: 44 mL/min — AB (ref >60)
GFR, EST NON AFRICAN AMERICAN: 38 mL/min — AB (ref >60)
Glucose, Bld: 211 mg/dL — ABNORMAL HIGH (ref 70–99)
POTASSIUM: 4.5 mmol/L (ref 3.5–5.1)
SODIUM: 135 mmol/L (ref 135–145)

## 2017-06-26 LAB — GLUCOSE, CAPILLARY
GLUCOSE-CAPILLARY: 201 mg/dL — AB (ref 70–99)
GLUCOSE-CAPILLARY: 205 mg/dL — AB (ref 70–99)
GLUCOSE-CAPILLARY: 220 mg/dL — AB (ref 70–99)
GLUCOSE-CAPILLARY: 141 mg/dL — AB (ref 70–99)

## 2017-06-26 NOTE — Progress Notes (Signed)
06/26/2017 Pt is POD #1 and this is her second session.  She was able to progress gait out into the hallway with the RW, but continues to need min assist for balance and safety during all mobility.  PT will continue to follow acutely and pt remains appropriate for SNF level rehab at discharge. Barbarann Ehlers Somalia Segler, PT, [place] #[phone]      06/26/17 1715  PT Visit Information  Last PT Received On 06/26/17  Assistance Needed +1  History of Present Illness 77 y.o. female admitted on 06/25/17 for elective R TKA.  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   Subjective Data  Patient Stated Goal to go to rehab and then home  Precautions  Precautions Knee  Precaution Booklet Issued Yes (comment)  Precaution Comments knee exercise handout given and reviewed.  Reviewed no pillow under operative knee rule.   Required Braces or Orthoses Knee Immobilizer - Right  Restrictions  RLE Weight Bearing WBAT  Pain Assessment  Pain Assessment Faces  Pain Score 0 (at rest)  Faces Pain Scale 6 (with gait)  Pain Location right knee  Pain Descriptors / Indicators Aching;Burning  Pain Intervention(s) Limited activity within patient's tolerance;Monitored during session;Repositioned  Cognition  Arousal/Alertness Awake/alert  Behavior During Therapy WFL for tasks assessed/performed  Overall Cognitive Status Within Functional Limits for tasks assessed  Bed Mobility  Overal bed mobility Needs Assistance  Bed Mobility Sit to Supine  Sit to supine Min assist  General bed mobility comments Min assist to help pt lift her right leg into the bed.    Transfers  Overall transfer level Needs assistance  Equipment used Rolling walker (2 wheeled)  Transfers Sit to/from Stand  Sit to Stand Min assist  General transfer comment Min assist to help power up to standing with verbal cues for safe hand placement and RW use.   Ambulation/Gait  Ambulation/Gait  assistance Min assist  Gait Distance (Feet) 65 Feet  Assistive device Rolling walker (2 wheeled)  Gait Pattern/deviations Step-to pattern;Antalgic  General Gait Details Verbal cues for correct LE sequencing, safe RW use and min assist for balance during gait.   Balance  Overall balance assessment Needs assistance  Sitting-balance support Feet supported;No upper extremity supported  Sitting balance-Leahy Scale Good  Standing balance support Bilateral upper extremity supported  Standing balance-Leahy Scale Poor  Exercises  Exercises Total Joint  Total Joint Exercises  Short Arc Quad AROM;Right;10 reps  Hip ABduction/ADduction AROM;Right;10 reps  Straight Leg Raises AROM;Right;10 reps  Goniometric ROM 12-70  PT - End of Session  Equipment Utilized During Treatment Right knee immobilizer  Activity Tolerance Patient limited by pain  Patient left in bed;with call bell/phone within reach  Nurse Communication Mobility status   PT - Assessment/Plan  PT Plan Current plan remains appropriate  PT Visit Diagnosis Difficulty in walking, not elsewhere classified (R26.2);Muscle weakness (generalized) (M62.81);Pain  Pain - Right/Left Right  Pain - part of body Knee  PT Frequency (ACUTE ONLY) 7X/week  Follow Up Recommendations SNF;Other (comment) ([facility])  PT equipment None recommended by PT  AM-PAC PT "6 Clicks" Daily Activity Outcome Measure  Difficulty turning over in bed (including adjusting bedclothes, sheets and blankets)? 1  Difficulty moving from lying on back to sitting on the side of the bed?  1  Difficulty sitting down on and standing up from a chair with arms (e.g., wheelchair, bedside commode, etc,.)? 1  Help  needed moving to and from a bed to chair (including a wheelchair)? 3  Help needed walking in hospital room? 3  Help needed climbing 3-5 steps with a railing?  2  6 Click Score 11  Mobility G Code  CL  PT Goal Progression  Progress towards PT goals  Progressing toward goals  PT Time Calculation  PT Start Time (ACUTE ONLY) 1708  PT Stop Time (ACUTE ONLY) 1730  PT Time Calculation (min) (ACUTE ONLY) 22 min  PT General Charges  $$ ACUTE PT VISIT 1 Visit  PT Treatments  $Gait Training 8-22 mins

## 2017-06-26 NOTE — Evaluation (Signed)
Physical Therapy Evaluation Patient Details Name: Angelica French MRN: [id number] DOB: Feb 16, 1940 Today's Date: 06/26/2017   History of Present Illness  77 y.o. female admitted on 06/25/17 for elective R TKA.  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   Clinical Impression  Pt is POD #1 and is moving well, min assist for short distance gait in her room.  She fatigues easily and needs hands on assist for mobility. He husband is unable to physically assist her at discharge.  She is appropriate for SNF level rehab.   PT to follow acutely for deficits listed below.         Follow Up Recommendations SNF;Other (comment)([facility])    Equipment Recommendations  None recommended by PT    Recommendations for Other Services   NA    Precautions / Restrictions Precautions Precautions: Knee Precaution Booklet Issued: Yes (comment) Precaution Comments: knee exercise handout given and reviewed.  Reviewed no pillow under operative knee rule.  Required Braces or Orthoses: Knee Immobilizer - Right Restrictions RLE Weight Bearing: Weight bearing as tolerated      Mobility  Bed Mobility Overal bed mobility: Needs Assistance Bed Mobility: Supine to Sit     Supine to sit: Min assist     General bed mobility comments: Min assist to help progress right leg to EOB.   Transfers Overall transfer level: Needs assistance Equipment used: Rolling walker (2 wheeled) Transfers: Sit to/from Stand Sit to Stand: Min assist         General transfer comment: Min assist to help power up to standing with verbal cues for safe hand placement and RW use.   Ambulation/Gait Ambulation/Gait assistance: Min assist Gait Distance (Feet): 20 Feet Assistive device: Rolling walker (2 wheeled) Gait Pattern/deviations: Step-to pattern;Antalgic     General Gait Details: Verbal cues for correct LE sequencing, safe RW use and min  assist for balance during gait.          Balance Overall balance assessment: Needs assistance Sitting-balance support: Feet supported;No upper extremity supported Sitting balance-Leahy Scale: Good     Standing balance support: Bilateral upper extremity supported Standing balance-Leahy Scale: Poor                               Pertinent Vitals/Pain Pain Assessment: 0-10 Pain Score: 0-No pain    Home Living Family/patient expects to be discharged to:: Skilled nursing facility([name]) Living Arrangements: Spouse/significant other Available Help at Discharge: Family;Available 24 hours/day(physically cannot help) Type of Home: House Home Access: Level entry     Home Layout: One level Home Equipment: Walker - 4 wheels;Walker - 2 wheels;Bedside commode;Tub bench;Cane - single point;Wheelchair - Psychologist, educational      Prior Function Level of Independence: Independent         Comments: drives, works part time from home, wants to tend 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      Hand Dominance   Dominant Hand: Right    Extremity/Trunk Assessment   Upper Extremity Assessment Upper Extremity Assessment: Overall WFL for tasks assessed    Lower Extremity Assessment Lower Extremity Assessment: RLE deficits/detail RLE Deficits / Details: right leg with normal post op pain and weakness, ankle at least 3/5, knee 2/5, hip flexion 2+/5    Cervical / Trunk Assessment Cervical / Trunk Assessment: Other exceptions Cervical /  Trunk Exceptions: h/o back surgery  Communication   Communication: No difficulties  Cognition Arousal/Alertness: Awake/alert Behavior During Therapy: WFL for tasks assessed/performed Overall Cognitive Status: Within Functional Limits for tasks assessed                                           Exercises Total Joint Exercises Ankle Circles/Pumps: AROM;Both;20 reps Quad Sets: AROM;Right;10  reps Towel Squeeze: AROM;Both;10 reps Short Arc QuadSinclair Ship;Right;10 reps   Assessment/Plan    PT Assessment Patient needs continued PT services  PT Problem List Decreased strength;Decreased range of motion;Decreased activity tolerance;Decreased balance;Decreased mobility;Decreased knowledge of use of DME;Decreased knowledge of precautions;Pain       PT Treatment Interventions DME instruction;Gait training;Stair training;Functional mobility training;Therapeutic activities;Therapeutic exercise;Balance training;Manual techniques;Modalities;Patient/family education    PT Goals (Current goals can be found in the Care Plan section)  Acute Rehab PT Goals Patient Stated Goal: to go to rehab and then home PT Goal Formulation: With patient Time For Goal Achievement: 07/03/17 Potential to Achieve Goals: Good    Frequency 7X/week   Barriers to discharge Decreased caregiver support         AM-PAC PT "6 Clicks" Daily Activity  Outcome Measure Difficulty turning over in bed (including adjusting bedclothes, sheets and blankets)?: Unable Difficulty moving from lying on back to sitting on the side of the bed? : Unable Difficulty sitting down on and standing up from a chair with arms (e.g., wheelchair, bedside commode, etc,.)?: Unable Help needed moving to and from a bed to chair (including a wheelchair)?: A Little Help needed walking in hospital room?: A Little Help needed climbing 3-5 steps with a railing? : A Lot 6 Click Score: 11    End of Session Equipment Utilized During Treatment: Right knee immobilizer Activity Tolerance: Patient limited by pain Patient left: in chair;with call bell/phone within reach   PT Visit Diagnosis: Difficulty in walking, not elsewhere classified (R26.2);Muscle weakness (generalized) (M62.81);Pain Pain - Right/Left: Right Pain - part of body: Knee    Time: 6384-6659 PT Time Calculation (min) (ACUTE ONLY): 30 min   Charges:         Wells Guiles B.  Areonna Bran, PT, DPT [phone]   PT Evaluation $PT Eval Moderate Complexity: 1 Mod PT Treatments $Gait Training: 8-22 mins   06/26/2017, 5:03 PM

## 2017-06-26 NOTE — Progress Notes (Signed)
     Subjective: 1 Day Post-Op Procedure(s) (LRB): RIGHT TOTAL KNEE ARTHROPLASTY (Right) Awake, alert and oriented x 4. Some urinary retention symptoms, UTI preop antibiotics pre and post op.  Patient reports pain as moderate.    Objective:   VITALS:  Temp:  [97.5 F (36.4 C)-98.2 F (36.8 C)] 97.8 F (36.6 C) (06/25 0434) Pulse Rate:  [41-71] 55 (06/25 0434) Resp:  [0-16] 16 (06/25 0434) BP: (112-160)/(54-87) 127/54 (06/25 0434) SpO2:  [75 %-100 %] 100 % (06/25 0434)  Neurologically intact ABD soft Neurovascular intact Sensation intact distally Intact pulses distally Dorsiflexion/Plantar flexion intact Incision: dressing C/D/I Compartment soft   LABS Recent Labs    06/26/17 0609  HGB 9.5*  WBC 10.7*  PLT 160   Recent Labs    06/26/17 0609  NA 135  K 4.5  CL 106  CO2 21*  BUN 32*  CREATININE 1.33*  GLUCOSE 211*   No results for input(s): LABPT, INR in the last 72 hours.   Assessment/Plan: 1 Day Post-Op Procedure(s) (LRB): RIGHT TOTAL KNEE ARTHROPLASTY (Right)  Advance diet Up with therapy Continue ABX therapy due to Pre op UTI Has a vs PACU monitor in her pocket that is not necessary. Desires SNF at discharge, social service, need FL2.  Check for urinary retention. Bladder scan by nursing.  [name] 06/26/2017, 9:15 AMPatient ID: Angelica French, female   DOB: [birth year], 77 y.o.   MRN: [id number]

## 2017-06-26 NOTE — Care Management (Signed)
Case manager spoke with patient concerning discharge plan, she says she will be going to rehab, wants [facility]. Case manager passed this information on to Education officer, museum.

## 2017-06-26 NOTE — Progress Notes (Signed)
Patient has home medication at bedside (desoximetasone gel tube 0.05%; [facility] [phone]). Patient stated that she uses it when she changes her ileostomy bag every 3 days. Medication verified with pharmacy.

## 2017-06-26 NOTE — Progress Notes (Signed)
Inpatient Diabetes Program Recommendations  AACE/ADA: New Consensus Statement on Inpatient Glycemic Control (2019)  Target Ranges:  Prepandial:   less than 140 mg/dL      Peak postprandial:   less than 180 mg/dL (1-2 hours)      Critically ill patients:  140 - 180 mg/dL   Results for TYNEISHA, HEGEMAN (MRN [id number]) as of 06/26/2017 10:29  Ref. Range 06/25/2017 06:33 06/25/2017 10:26 06/25/2017 17:09 06/25/2017 20:47 06/26/2017 06:09  Glucose-Capillary Latest Ref Range: 70 - 99 mg/dL 134 (H) 155 (H) 243 (H) 266 (H) 201 (H)    Review of Glycemic Control  Diabetes history: DM2 Outpatient Diabetes medications: Amaryl 4 mg BID Current orders for Inpatient glycemic control: Novolog 0-9 units TID with meals  Inpatient Diabetes Program Recommendations: Correction (SSI): Please consider ordering Novolog 0-5 units QHS for bedtime correction if needed.   NOTE: Noted patient received one time dose of Decadron 10 mg on 06/25/17 which is contributing to hyperglycemia. No other steroids ordered so anticipate glycemic control to improve.  Thanks, Barnie Alderman, RN, MSN, CDE Diabetes Coordinator Inpatient Diabetes Program [phone] (Team Pager from 8am to 5pm)

## 2017-06-26 NOTE — Progress Notes (Signed)
Orthopedic Tech Progress Note Patient Details:  Angelica French 12/02/1940 [id number]  CPM Right Knee CPM Right Knee: On Right Knee Flexion (Degrees): 65 Right Knee Extension (Degrees): 0 Additional Comments: delivered to room  Post Interventions Patient Tolerated: Well Instructions Provided: Care of device, Adjustment of device  Karolee Stamps 06/26/2017, 6:12 PM

## 2017-06-26 NOTE — Clinical Social Work Note (Signed)
Clinical Social Work Assessment  Patient Details  Name: Angelica French MRN: [id number] Date of Birth: Aug 14, 1940  Date of referral:  06/26/17               Reason for consult:  Facility Placement                Permission sought to share information with:  Chartered certified accountant granted to share information::  Yes, Verbal Permission Granted  Name::     Ed Training and development officer::  SNF  Relationship::  spouse  Contact Information:     Housing/Transportation Living arrangements for the past 2 months:  Single Family Home Source of Information:  Patient Patient Interpreter Needed:  None Criminal Activity/Legal Involvement Pertinent to Current Situation/Hospitalization:  No - Comment as needed Significant Relationships:  Adult Children, Other Family Members, Spouse Lives with:  Spouse Do you feel safe going back to the place where you live?  No Need for family participation in patient care:  No (Coment)  Care giving concerns:  Pt from home with spouse and will need short term rehab at discharge.  Social Worker assessment / plan:  CSW met with patient and spouse at bedside. Pt has experience with SNF and has been [facility] in the past. CSW explained SNF process and placement. Pt indicated that she was independent with ADL's and ambulation prior to hospitalization.  CSW obtained perm to send to SNF's.   CSW will f/u on disposition.  Employment status:  Retired Forensic scientist:  Medicare PT Recommendations:  [place] / Referral to community resources:  [place]  Patient/Family's Response to care:  Pt appreciative of CSW meeting to discuss SNF options.  Patient/Family's Understanding of and Emotional Response to Diagnosis, Current Treatment, and Prognosis:  Pt has good understanding of impairment as she has had other knee repaired in the past. Pt endorses that she will need support and cannot return home at this time.  Pt agreeable with plan and will return home at dc to spouse. No issues or concerns identified.  Emotional Assessment Appearance:  Appears stated age Attitude/Demeanor/Rapport:  (Coopertive) Affect (typically observed):  Accepting, Appropriate Orientation:  Oriented to Self, Oriented to Place, Oriented to  Time, Oriented to Situation Alcohol / Substance use:  Not Applicable Psych involvement (Current and /or in the community):  No (Comment)  Discharge Needs  Concerns to be addressed:  Discharge Planning Concerns Readmission within the last 30 days:  No Current discharge risk:  Dependent with Mobility, Physical Impairment Barriers to Discharge:  No Barriers Identified   Normajean Baxter, LCSW 06/26/2017, 3:14 PM

## 2017-06-26 NOTE — Progress Notes (Signed)
Pt with urinary retention, 12:18 bladder scan=923mL, in and out output=1033mL. 18:20 bladder scan=539, in and out output=700. Will continue to monitor.

## 2017-06-26 NOTE — Plan of Care (Signed)

## 2017-06-27 LAB — URINALYSIS, ROUTINE W REFLEX MICROSCOPIC
Bilirubin Urine: NEGATIVE
Glucose, UA: NEGATIVE mg/dL
Hgb urine dipstick: NEGATIVE
KETONES UR: NEGATIVE mg/dL
LEUKOCYTES UA: NEGATIVE
Nitrite: NEGATIVE
PROTEIN: NEGATIVE mg/dL
Specific Gravity, Urine: 1.013 (ref 1.005–1.030)
pH: 5 (ref 5.0–8.0)

## 2017-06-27 LAB — CBC WITH DIFFERENTIAL/PLATELET
Abs Immature Granulocytes: 0.1 10*3/uL (ref 0.0–0.1)
Basophils Absolute: 0 10*3/uL (ref 0.0–0.1)
Basophils Relative: 0 %
EOS PCT: 0 %
Eosinophils Absolute: 0 10*3/uL (ref 0.0–0.7)
HEMATOCRIT: 29.1 % — AB (ref 36.0–46.0)
HEMOGLOBIN: 9 g/dL — AB (ref 12.0–15.0)
Immature Granulocytes: 1 %
LYMPHS ABS: 0.8 10*3/uL (ref 0.7–4.0)
Lymphocytes Relative: 7 %
MCH: 26.9 pg (ref 26.0–34.0)
MCHC: 30.9 g/dL (ref 30.0–36.0)
MCV: 86.9 fL (ref 78.0–100.0)
MONO ABS: 1 10*3/uL (ref 0.1–1.0)
Monocytes Relative: 9 %
NEUTROS ABS: 9.7 10*3/uL — AB (ref 1.7–7.7)
NEUTROS PCT: 83 %
Platelets: 174 10*3/uL (ref 150–400)
RBC: 3.35 MIL/uL — AB (ref 3.87–5.11)
RDW: 15.9 % — ABNORMAL HIGH (ref 11.5–15.5)
WBC: 11.5 10*3/uL — ABNORMAL HIGH (ref 4.0–10.5)

## 2017-06-27 LAB — BASIC METABOLIC PANEL
Anion gap: 8 (ref 5–15)
BUN: 36 mg/dL — ABNORMAL HIGH (ref 8–23)
CO2: 21 mmol/L — ABNORMAL LOW (ref 22–32)
CREATININE: 1.29 mg/dL — AB (ref 0.44–1.00)
Calcium: 8.3 mg/dL — ABNORMAL LOW (ref 8.9–10.3)
Chloride: 106 mmol/L (ref 98–111)
GFR calc non Af Amer: 39 mL/min — ABNORMAL LOW (ref >60)
GFR, EST AFRICAN AMERICAN: 45 mL/min — AB (ref >60)
Glucose, Bld: 134 mg/dL — ABNORMAL HIGH (ref 70–99)
Potassium: 4.3 mmol/L (ref 3.5–5.1)
Sodium: 135 mmol/L (ref 135–145)

## 2017-06-27 LAB — GLUCOSE, CAPILLARY
GLUCOSE-CAPILLARY: 138 mg/dL — AB (ref 70–99)
GLUCOSE-CAPILLARY: 136 mg/dL — AB (ref 70–99)
Glucose-Capillary: 127 mg/dL — ABNORMAL HIGH (ref 70–99)
Glucose-Capillary: 126 mg/dL — ABNORMAL HIGH (ref 70–99)

## 2017-06-27 NOTE — Consult Note (Signed)
The patient has postoperative urinary retention.  Her poor bladder emptying may be long-standing, but more than likely related to her recent surgery.  I have ordered a catheter to be placed.  It would be best to leave this in for a few days and follow-up in our office.

## 2017-06-27 NOTE — NC FL2 (Signed)
[place] MEDICAID FL2 LEVEL OF CARE SCREENING TOOL     IDENTIFICATION  Patient Name: Angelica French Birthdate: [birth year] Sex: female Admission Date (Current Location): 06/25/2017  [facility] and [place] Number:  Herbalist and Address:  The . [facility], [place] [address], [place], [place] [address]      Provider Number: [id number]  Attending Physician Name and Address:  Jessy Oto, MD  Relative Name and Phone Number:  Aerial Dilley, spouse, [phone]    Current Level of Care: Hospital Recommended Level of Care: [place] Prior Approval Number:    Date Approved/Denied:   PASRR Number: [id number]  Discharge Plan: SNF    Current Diagnoses: Patient Active Problem List   Diagnosis Date Noted  . S/P TKR (total knee replacement) using cement, right 06/25/2017  . Bilateral primary osteoarthritis of knee 04/13/2017    Class: Chronic  . Status post left knee replacement 04/13/2017  . Dry eye syndrome of both lacrimal glands 05/25/2016  . Myopia with astigmatism and presbyopia, bilateral 05/25/2016  . Posterior vitreous detachment of both eyes 05/25/2016  . Refractive amblyopia of left eye 05/25/2016  . Diabetes mellitus without complication ([place]) 46/27/0350  . Depression 03/01/2016  . Hypokalemia 08/31/2015  . CKD (chronic kidney disease) stage 3, GFR 30-59 ml/min (HCC) 07/08/2015  . Type 2 diabetes mellitus with diabetic chronic kidney disease ([place]) 06/29/2015  . Atrophy of right kidney 04/26/2015  . Benign hypertension with CKD (chronic kidney disease) stage III ([place]) 03/22/2015  . Mixed hyperlipidemia 03/22/2015  . Renal artery stenosis of unknown cause ([place]) 02/17/2015  . Right renal artery stenosis ([place]) 02/17/2015  . Hyperlipidemia 09/16/2014  . Type 2 diabetes mellitus ([place]) 09/16/2014    Orientation RESPIRATION BLADDER Height & Weight     Self, Time, Situation, Place  Normal Continent Weight: 134 lb (60.8  kg) Height:  5' (152.4 cm)  BEHAVIORAL SYMPTOMS/MOOD NEUROLOGICAL BOWEL NUTRITION STATUS      Continent Diet(See DC Summary)  AMBULATORY STATUS COMMUNICATION OF NEEDS Skin   Limited Assist Verbally Surgical wounds                       Personal Care Assistance Level of Assistance  Bathing, Feeding, Dressing Bathing Assistance: Limited assistance Feeding assistance: Limited assistance Dressing Assistance: Limited assistance     Functional Limitations Info  Sight, Hearing, Speech Sight Info: Adequate Hearing Info: Adequate Speech Info: Adequate    SPECIAL CARE FACTORS FREQUENCY  PT (By licensed PT), OT (By licensed OT)     PT Frequency: 5x week OT Frequency: 5x week            Contractures      Additional Factors Info  Code Status, Allergies, Insulin Sliding Scale Code Status Info: Full Allergies Info: ADHESIVE TAPE, NSAIDS    Insulin Sliding Scale Info: Insulin Daily       Current Medications (06/27/2017):  This is the current hospital active medication list Current Facility-Administered Medications  Medication Dose Route Frequency Provider Last Rate Last Dose  . 0.9 %  sodium chloride infusion   Intravenous Continuous Jessy Oto, MD 75 mL/hr at 06/27/17 0128    . acetaminophen (TYLENOL) tablet 325-650 mg  325-650 mg Oral Q6H PRN Jessy Oto, MD      . ampicillin (PRINCIPEN) capsule 500 mg  500 mg Oral Q8H Jessy Oto, MD   500 mg at 06/27/17 0558  . aspirin EC tablet 325 mg  325 mg Oral Q breakfast Jessy Oto, MD   325 mg at 06/27/17 0857  . atorvastatin (LIPITOR) tablet 20 mg  20 mg Oral Q M,W,F-1800 Jessy Oto, MD   20 mg at 06/25/17 1844  . bisacodyl (DULCOLAX) suppository 10 mg  10 mg Rectal Daily PRN Jessy Oto, MD      . carvedilol (COREG) tablet 25 mg  25 mg Oral QHS Jessy Oto, MD   25 mg at 06/25/17 2138  . cholecalciferol (VITAMIN D) tablet 1,000 Units  1,000 Units Oral Brock Ra, MD   1,000 Units at 06/27/17 [phone]   . clindamycin (CLEOCIN) 2 % vaginal cream 1 Applicatorful  1 Applicatorful Vaginal Daily PRN Jessy Oto, MD      . Desoximetasone 9.02 % GEL 1 application  1 application Apply externally Daily PRN Jessy Oto, MD      . dextromethorphan-guaiFENesin [facility] DM) 30-600 MG per 12 hr tablet 1 tablet  1 tablet Oral BID Jessy Oto, MD   1 tablet at 06/27/17 [phone]  . diphenhydrAMINE (BENADRYL) 12.5 MG/5ML elixir 12.5-25 mg  12.5-25 mg Oral Q4H PRN Jessy Oto, MD      . docusate sodium (COLACE) capsule 100 mg  100 mg Oral BID Jessy Oto, MD   100 mg at 06/27/17 0858  . furosemide (LASIX) tablet 40 mg  40 mg Oral Daily PRN Jessy Oto, MD      . gabapentin (NEURONTIN) capsule 300 mg  300 mg Oral TID Jessy Oto, MD   300 mg at 06/27/17 0858  . hydrALAZINE (APRESOLINE) tablet 50 mg  50 mg Oral BID Jessy Oto, MD   50 mg at 06/27/17 0857  . hydrochlorothiazide (HYDRODIURIL) tablet 25 mg  25 mg Oral q morning - 10a Jessy Oto, MD   25 mg at 06/27/17 0857  . HYDROcodone-acetaminophen (NORCO) 7.5-325 MG per tablet 1-2 tablet  1-2 tablet Oral Q4H PRN Jessy Oto, MD   2 tablet at 06/27/17 0857  . insulin aspart (novoLOG) injection 0-9 Units  0-9 Units Subcutaneous TID WC Jessy Oto, MD   1 Units at 06/27/17 [phone]  . loratadine (CLARITIN) tablet 10 mg  10 mg Oral Daily Jessy Oto, MD   10 mg at 06/27/17 0858  . losartan (COZAAR) tablet 50 mg  50 mg Oral QHS Jessy Oto, MD   50 mg at 06/25/17 2139  . magic mouthwash  5 mL Oral QID PRN Jessy Oto, MD      . menthol-cetylpyridinium (CEPACOL) lozenge 3 mg  1 lozenge Oral PRN Jessy Oto, MD       Or  . phenol (CHLORASEPTIC) mouth spray 1 spray  1 spray Mouth/Throat PRN Jessy Oto, MD      . methocarbamol (ROBAXIN) tablet 500 mg  500 mg Oral Q6H PRN Jessy Oto, MD   500 mg at 06/26/17 1723   Or  . methocarbamol (ROBAXIN) 500 mg in dextrose 5 % 50 mL IVPB  500 mg Intravenous Q6H PRN Jessy Oto, MD       . metoCLOPramide (REGLAN) tablet 5-10 mg  5-10 mg Oral Q8H PRN Jessy Oto, MD       Or  . metoCLOPramide (REGLAN) injection 5-10 mg  5-10 mg Intravenous Q8H PRN Jessy Oto, MD      . ondansetron [facility]) tablet 4 mg  4 mg Oral Q6H PRN [name]  E, MD       Or  . ondansetron (ZOFRAN) injection 4 mg  4 mg Intravenous Q6H PRN Jessy Oto, MD      . pantoprazole (PROTONIX) EC tablet 40 mg  40 mg Oral Daily Jessy Oto, MD   40 mg at 06/27/17 0858  . polyethylene glycol (MIRALAX / GLYCOLAX) packet 17 g  17 g Oral Daily PRN Jessy Oto, MD      . potassium chloride SA (K-DUR,KLOR-CON) CR tablet 20 mEq  20 mEq Oral Daily PRN Jessy Oto, MD      . sodium phosphate (FLEET) 7-19 GM/118ML enema 1 enema  1 enema Rectal Once PRN Jessy Oto, MD         Discharge Medications: Please see discharge summary for a list of discharge medications.  Relevant Imaging Results:  Relevant Lab Results:   Additional Information SS#: [id number]  Normajean Baxter, LCSW

## 2017-06-27 NOTE — Progress Notes (Signed)
Subjective: Doing well.  Needed I/O cath yesterday.  Pain controlled.    Objective: Vital signs in last 24 hours: Temp:  [97.6 F (36.4 C)-97.9 F (36.6 C)] 97.9 F (36.6 C) (06/26 0424) Pulse Rate:  [57-60] 57 (06/26 0424) Resp:  [14-16] 16 (06/26 0424) BP: (93-117)/(54-67) 116/67 (06/26 0424) SpO2:  [94 %-99 %] 94 % (06/26 0424)  Intake/Output from previous day: 06/25 0701 - 06/26 0700 In: 1568 [P.O.:840; I.V.:728] Out: 2600 [Urine:2400; Stool:200] Intake/Output this shift: No intake/output data recorded.  Recent Labs    06/26/17 0609 06/27/17 0341  HGB 9.5* 9.0*   Recent Labs    06/26/17 0609 06/27/17 0341  WBC 10.7* 11.5*  RBC 3.55* 3.35*  HCT 30.5* 29.1*  PLT 160 174   Recent Labs    06/26/17 0609 06/27/17 0341  NA 135 135  K 4.5 4.3  CL 106 106  CO2 21* 21*  BUN 32* 36*  CREATININE 1.33* 1.29*  GLUCOSE 211* 134*  CALCIUM 8.6* 8.3*   No results for input(s): LABPT, INR in the last 72 hours.  Exam Pleasant elderly female. Alert and oriented. NAD.  Wound looks good.  Staples intact.  No drainage or signs of infection.  Calf nontender NVI.  mepilex dressing applied along with thigh hi ted hose.        Assessment/Plan: Continue present care.  Awaiting snf placement for rehab.  Patient [facility] and has been there previously after left TKA.  D/c morphine.    Benjiman Core 06/27/2017, 8:52 AM

## 2017-06-27 NOTE — Social Work (Addendum)
CSW contacted [name] and they confirmed SNF bed. CSW met with patient at bedside and she accepted SNF bed.  Pt will dc to Angelica French when ready.  Elissa Hefty, LCSW Clinical Social Worker [phone]

## 2017-06-27 NOTE — Progress Notes (Signed)
Physical Therapy Treatment Patient Details Name: Angelica French French MRN: [id number] DOB: October 09, 1940 Today's Date: 06/27/2017    History of Present Illness 77 y.o. female admitted on 06/25/17 for elective R TKA.  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 Pt with post op urinary retention requiring in and out catheterizations    PT Comments    Pt is more sore this AM and did not feel up to hallway ambulation due to this increased pain.  She did feel better after we got up and walked a short distance around her room and preformed the first page of her HEP program.  Ice applied and pt left up in the recliner chair.   Follow Up Recommendations  SNF     Equipment Recommendations  None recommended by PT    Recommendations for Other Services   NA     Precautions / Restrictions Precautions Precautions: Knee Precaution Booklet Issued: Yes (comment) Precaution Comments: knee exercise handout given and reviewed.  Reviewed no pillow under operative knee rule.  Required Braces or Orthoses: Knee Immobilizer - Right Restrictions RLE Weight Bearing: Weight bearing as tolerated    Mobility  Bed Mobility Overal bed mobility: Needs Assistance Bed Mobility: Supine to Sit     Supine to sit: Min assist     General bed mobility comments: Min assist to help progress right leg to EOB.  KI donned in bed prior to bmobilizing.   Transfers Overall transfer level: Needs assistance Equipment used: Rolling walker (2 wheeled) Transfers: Sit to/from Stand Sit to Stand: Min assist         General transfer comment: Heavier min assist to get to standing today, pt reporting increased soreness.   Ambulation/Gait Ambulation/Gait assistance: Min assist Gait Distance (Feet): 20 Feet Assistive device: Rolling walker (2 wheeled) Gait Pattern/deviations: Step-to pattern;Antalgic     General Gait Details: Pt with moderately antalgic gait  pattern, heavier min assist for safety and balance during gait today due to increased pain with WB.         Balance Overall balance assessment: Needs assistance Sitting-balance support: Feet supported;Bilateral upper extremity supported Sitting balance-Leahy Scale: Good     Standing balance support: Bilateral upper extremity supported Standing balance-Leahy Scale: Poor                              Cognition Arousal/Alertness: Awake/alert Behavior During Therapy: WFL for tasks assessed/performed Overall Cognitive Status: Within Functional Limits for tasks assessed                                        Exercises Total Joint Exercises Ankle Circles/Pumps: AROM;Both;20 reps Quad Sets: AROM;Right;10 reps Towel Squeeze: AROM;Both;10 reps Heel Slides: AAROM;Right;10 reps Goniometric ROM: 8-65    General Comments        Pertinent Vitals/Pain Pain Assessment: Faces Faces Pain Scale: Hurts whole lot Pain Location: right knee Pain Descriptors / Indicators: Aching;Burning Pain Intervention(s): Limited activity within patient's tolerance;Monitored during session;Repositioned;Ice applied           PT Goals (current goals can now be found in the care plan section) Acute Rehab PT Goals Patient Stated Goal: to go to rehab and then home Progress towards PT goals: Progressing toward goals    Frequency    7X/week  PT Plan Current plan remains appropriate       AM-PAC PT "6 Clicks" Daily Activity  Outcome Measure  Difficulty turning over in bed (including adjusting bedclothes, sheets and blankets)?: A Little Difficulty moving from lying on back to sitting on the side of the bed? : A Little Difficulty sitting down on and standing up from a chair with arms (e.g., wheelchair, bedside commode, etc,.)?: A Little Help needed moving to and from a bed to chair (including a wheelchair)?: A Little Help needed walking in hospital room?: A  Little Help needed climbing 3-5 steps with a railing? : A Little 6 Click Score: 18    End of Session Equipment Utilized During Treatment: Right knee immobilizer Activity Tolerance: No increased pain;Patient limited by fatigue Patient left: in chair;with call bell/phone within reach   PT Visit Diagnosis: Difficulty in walking, not elsewhere classified (R26.2);Muscle weakness (generalized) (M62.81);Pain Pain - Right/Left: Right Pain - part of body: Knee     Time: 1127-1150 PT Time Calculation (min) (ACUTE ONLY): 23 min  Charges:  $Gait Training: 8-22 mins $Therapeutic Exercise: 8-22 mins          Chaise Mahabir B. Caban, [place], DPT [phone]            06/27/2017, 6:09 PM

## 2017-06-27 NOTE — Progress Notes (Signed)
     Subjective: 2 Days Post-Op Procedure(s) (LRB): RIGHT TOTAL KNEE ARTHROPLASTY (Right) Awake, alert and oriented x 4. Voiding well, bladder scan 620 cc post void will continue with in and out cath. She is on the remainder of po antibiotics for UTI maybe send a clean cath specimen for UA with next cath.   Patient reports pain as moderate.    Objective:   VITALS:  Temp:  [97.6 F (36.4 C)-97.9 F (36.6 C)] 97.9 F (36.6 C) (06/26 0424) Pulse Rate:  [57-60] 57 (06/26 0424) Resp:  [14-16] 16 (06/26 0424) BP: (93-117)/(54-67) 116/67 (06/26 0424) SpO2:  [94 %-99 %] 94 % (06/26 0424)  Neurologically intact ABD soft Neurovascular intact Sensation intact distally Intact pulses distally Dorsiflexion/Plantar flexion intact Incision: scant drainage No cellulitis present Compartment soft Dressing changed today.   LABS Recent Labs    06/26/17 0609 06/27/17 0341  HGB 9.5* 9.0*  WBC 10.7* 11.5*  PLT 160 174   Recent Labs    06/26/17 0609 06/27/17 0341  NA 135 135  K 4.5 4.3  CL 106 106  CO2 21* 21*  BUN 32* 36*  CREATININE 1.33* 1.29*  GLUCOSE 211* 134*   No results for input(s): LABPT, INR in the last 72 hours.   Assessment/Plan: 2 Days Post-Op Procedure(s) (LRB): RIGHT TOTAL KNEE ARTHROPLASTY (Right)  Advance diet Up with therapy D/C IV fluids Continue ABX therapy due to Pre-op UTI infection  [name] 06/27/2017, 8:50 AMPatient ID: Angelica French, female   DOB: 05/11/1940, 77 y.o.   MRN: [id number]

## 2017-06-27 NOTE — Progress Notes (Signed)
Patient with urinary retention. Bladder scanned the patient = 642ml. Notified Randy patient's RN of the bladder scan outcome.

## 2017-06-27 NOTE — Discharge Summary (Signed)
Physician Discharge Summary      Patient ID: Angelica French MRN: [id number] DOB/AGE: [birth year] 77 y.o.  Admit date: 06/25/2017 Discharge date: 06/28/2017  Admission Diagnoses:  Active Problems:   Postoperative urinary retention   Urinary tract infection   Anemia due to blood loss   S/P TKR (total knee replacement) using cement, right   Discharge Diagnoses:  Same  Past Medical History:  Diagnosis Date  . Arthritis   . Bleeding ulcer   . CHF (congestive heart failure) ([place])   . Chronic kidney disease    Renal artery stenosis - right. Left kindey ok- sees Dr Neta Ehlers- nepjrologist  . Diabetes mellitus without complication ([place])    type II  . Gastric ulcer 1994   bleeding  . History of blood transfusion    1970- illeostomy 2004-  . Hypertension   . UC (ulcerative colitis) ([place])   . VRE (vancomycin resistant enterococcus) culture positive 2004    Surgeries: Procedure(s): RIGHT TOTAL KNEE ARTHROPLASTY on 06/25/2017   Consultants:   Discharged Condition: Improved  Hospital Course: JULENE French is an 77 y.o. female who was admitted 06/25/2017 with a chief complaint of No chief complaint on file. , and found to have a diagnosis of <principal problem not specified>.  They were brought to the operating room on 06/25/2017 and underwent the above named procedures.   In the week previous to her admission she was found to have a UTI, was placed on ampicillin 500 mg every 6 hours at least 4 days prior to her surgey. She was given perioperative antibiotics:  Anti-infectives (From admission, onward)   Start     Dose/Rate Route Frequency Ordered Stop   06/26/17 0700  ampicillin (PRINCIPEN) capsule 500 mg     500 mg Oral Every 8 hours 06/25/17 1654     06/25/17 1930  ceFAZolin (ANCEF) IVPB 1 g/50 mL premix     1 g 100 mL/hr over 30 Minutes Intravenous Every 12 hours 06/25/17 1654 06/26/17 0841   06/25/17 0600  ceFAZolin (ANCEF) IVPB 2g/100 mL premix     2 g 200 mL/hr over 30  Minutes Intravenous On call to O.R. 06/25/17 0541 06/25/17 0800    She recovered in the PACU with complaints of pain given dilaudid and morphine and robaxin. Has been able to transition to oral narcotics Hydrocodone 1-2 tablets every 4-6 hours. POD#1 awake, alert. Foley discontinued and PT and OT initiated. Moderately severe pain complaints. Complained of urinary retention, bladder scan post void with 610cc of urine volume in bladder. She is continuing with her preoperative antibiotics. Ampicillin 500 mg Q 6 hours. Begun of in and out cath regimen q 6 hours.  Ancef periop discontinued post surgery. Hgb 9.5, creatinine slightly elevated  1.28-1.33. She had mild elevation of blood sugars in 200-220 range but this improved to 100-150 on POD#2 to 145. On POD#2 she continued with urinary retention and in and out catheterization. A urology consult was requested. At her previous hospitalization 04/2017 for left TKR she was transferred to SNF post hospitalization and she request the Same [facility] for her post hospitalization care. A FL2 form was signed on 6/26 and the SNF agreed to accepting her in transfer.  Pending the urology evaluation she is scheduled to be transferred to [facility] on Thursday 06/28/2017, POD#3. Seen by Dr. Diona Fanti and a indwelling foley catheter placed. A follow up appointment with [facility] In one week for a voiding trial with removal of the foley  catheter.  She was discharged to a SNF on POD#3 [facility].   She was given sequential compression devices, early ambulation, and chemoprophylaxis for DVT prophylaxis.  She benefited maximally from their hospital stay and there were no complications.    Recent vital signs:  Vitals:   06/27/17 2100 06/28/17 0626  BP: (!) 156/81 (!) 94/43  Pulse: 95 66  Resp:  17  Temp: 98.6 F (37 C) 98.2 F (36.8 C)  SpO2: 100% 98%    Recent laboratory studies:  Results for orders placed or performed during the  hospital encounter of 06/25/17  Glucose, capillary  Result Value Ref Range   Glucose-Capillary 134 (H) 65 - 99 mg/dL   Comment 1 Notify RN    Comment 2 Document in Chart   Glucose, capillary  Result Value Ref Range   Glucose-Capillary 155 (H) 65 - 99 mg/dL  Hemoglobin A1c  Result Value Ref Range   Hgb A1c MFr Bld 6.6 (H) 4.8 - 5.6 %   Mean Plasma Glucose 142.72 mg/dL  CBC  Result Value Ref Range   WBC 10.7 (H) 4.0 - 10.5 K/uL   RBC 3.55 (L) 3.87 - 5.11 MIL/uL   Hemoglobin 9.5 (L) 12.0 - 15.0 g/dL   HCT 30.5 (L) 36.0 - 46.0 %   MCV 85.9 78.0 - 100.0 fL   MCH 26.8 26.0 - 34.0 pg   MCHC 31.1 30.0 - 36.0 g/dL   RDW 15.2 11.5 - 15.5 %   Platelets 160 150 - 400 K/uL  Basic metabolic panel  Result Value Ref Range   Sodium 135 135 - 145 mmol/L   Potassium 4.5 3.5 - 5.1 mmol/L   Chloride 106 98 - 111 mmol/L   CO2 21 (L) 22 - 32 mmol/L   Glucose, Bld 211 (H) 70 - 99 mg/dL   BUN 32 (H) 8 - 23 mg/dL   Creatinine, Ser 1.33 (H) 0.44 - 1.00 mg/dL   Calcium 8.6 (L) 8.9 - 10.3 mg/dL   GFR calc non Af Amer 38 (L) >60 mL/min   GFR calc Af Amer 44 (L) >60 mL/min   Anion gap 8 5 - 15  Glucose, capillary  Result Value Ref Range   Glucose-Capillary 243 (H) 65 - 99 mg/dL  Glucose, capillary  Result Value Ref Range   Glucose-Capillary 266 (H) 65 - 99 mg/dL   Comment 1 Notify RN   Glucose, capillary  Result Value Ref Range   Glucose-Capillary 201 (H) 70 - 99 mg/dL  Glucose, capillary  Result Value Ref Range   Glucose-Capillary 205 (H) 70 - 99 mg/dL  Glucose, capillary  Result Value Ref Range   Glucose-Capillary 220 (H) 70 - 99 mg/dL  CBC with Differential/Platelet  Result Value Ref Range   WBC 11.5 (H) 4.0 - 10.5 K/uL   RBC 3.35 (L) 3.87 - 5.11 MIL/uL   Hemoglobin 9.0 (L) 12.0 - 15.0 g/dL   HCT 29.1 (L) 36.0 - 46.0 %   MCV 86.9 78.0 - 100.0 fL   MCH 26.9 26.0 - 34.0 pg   MCHC 30.9 30.0 - 36.0 g/dL   RDW 15.9 (H) 11.5 - 15.5 %   Platelets 174 150 - 400 K/uL   Neutrophils Relative  % 83 %   Neutro Abs 9.7 (H) 1.7 - 7.7 K/uL   Lymphocytes Relative 7 %   Lymphs Abs 0.8 0.7 - 4.0 K/uL   Monocytes Relative 9 %   Monocytes Absolute 1.0 0.1 - 1.0 K/uL   Eosinophils Relative  0 %   Eosinophils Absolute 0.0 0.0 - 0.7 K/uL   Basophils Relative 0 %   Basophils Absolute 0.0 0.0 - 0.1 K/uL   Immature Granulocytes 1 %   Abs Immature Granulocytes 0.1 0.0 - 0.1 K/uL  Basic metabolic panel  Result Value Ref Range   Sodium 135 135 - 145 mmol/L   Potassium 4.3 3.5 - 5.1 mmol/L   Chloride 106 98 - 111 mmol/L   CO2 21 (L) 22 - 32 mmol/L   Glucose, Bld 134 (H) 70 - 99 mg/dL   BUN 36 (H) 8 - 23 mg/dL   Creatinine, Ser 1.29 (H) 0.44 - 1.00 mg/dL   Calcium 8.3 (L) 8.9 - 10.3 mg/dL   GFR calc non Af Amer 39 (L) >60 mL/min   GFR calc Af Amer 45 (L) >60 mL/min   Anion gap 8 5 - 15  Glucose, capillary  Result Value Ref Range   Glucose-Capillary 141 (H) 70 - 99 mg/dL  Glucose, capillary  Result Value Ref Range   Glucose-Capillary 127 (H) 70 - 99 mg/dL  Urinalysis, Routine w reflex microscopic  Result Value Ref Range   Color, Urine YELLOW YELLOW   APPearance CLEAR CLEAR   Specific Gravity, Urine 1.013 1.005 - 1.030   pH 5.0 5.0 - 8.0   Glucose, UA NEGATIVE NEGATIVE mg/dL   Hgb urine dipstick NEGATIVE NEGATIVE   Bilirubin Urine NEGATIVE NEGATIVE   Ketones, ur NEGATIVE NEGATIVE mg/dL   Protein, ur NEGATIVE NEGATIVE mg/dL   Nitrite NEGATIVE NEGATIVE   Leukocytes, UA NEGATIVE NEGATIVE  Glucose, capillary  Result Value Ref Range   Glucose-Capillary 138 (H) 70 - 99 mg/dL  Glucose, capillary  Result Value Ref Range   Glucose-Capillary 126 (H) 70 - 99 mg/dL  CBC  Result Value Ref Range   WBC 7.2 4.0 - 10.5 K/uL   RBC 3.26 (L) 3.87 - 5.11 MIL/uL   Hemoglobin 8.7 (L) 12.0 - 15.0 g/dL   HCT 28.3 (L) 36.0 - 46.0 %   MCV 86.8 78.0 - 100.0 fL   MCH 26.7 26.0 - 34.0 pg   MCHC 30.7 30.0 - 36.0 g/dL   RDW 15.9 (H) 11.5 - 15.5 %   Platelets 158 150 - 400 K/uL  Glucose, capillary    Result Value Ref Range   Glucose-Capillary 136 (H) 70 - 99 mg/dL  Glucose, capillary  Result Value Ref Range   Glucose-Capillary 142 (H) 70 - 99 mg/dL  Type and screen [place]  Result Value Ref Range   ABO/RH(D) A POS    Antibody Screen NEG    Sample Expiration      06/28/2017 Performed at [facility], [address], [place], [place] [address]     Discharge Medications:   Allergies as of 06/28/2017      Reactions   Adhesive [tape] Hives, Itching   Nsaids Nausea Only, Other (See Comments)   Gi pain after prolonged use  ULCER BUFFERED ASA ALSO INCLUDED      Medication List    STOP taking these medications   ampicillin 500 MG capsule Commonly known as:  PRINCIPEN   HYDROcodone-acetaminophen 5-325 MG tablet Commonly known as:  NORCO/VICODIN Replaced by:  HYDROcodone-acetaminophen 7.5-325 MG tablet     TAKE these medications   acetaminophen 325 MG tablet Commonly known as:  TYLENOL Take 1-2 tablets (325-650 mg total) by mouth every 6 (six) hours as needed for mild pain (pain score 1-3 or temp > 100.5).  aspirin 325 MG EC tablet Take 1 tablet (325 mg total) by mouth daily with breakfast.   atorvastatin 20 MG tablet Commonly known as:  LIPITOR Take 20 mg by mouth. On Monday, Wednesday and Friday   Biotin 2500 MCG Caps Take 2,500 mcg by mouth daily.   carvedilol 25 MG tablet Commonly known as:  COREG Take 25 mg by mouth at bedtime.   cholecalciferol 1000 units tablet Commonly known as:  VITAMIN D Take 1,000 Units by mouth every other day.   clindamycin 2 % vaginal cream Commonly known as:  CLEOCIN Place 1 Applicatorful vaginally daily as needed (irritation).   Desoximetasone 0.05 % Gel Apply 1 application topically daily as needed (ostomy pouch irritation).   dextromethorphan-guaiFENesin 30-600 MG 12hr tablet Commonly known as:  MUCINEX DM Take 1 tablet by mouth 2 (two) times daily.   diphenhydrAMINE 12.5 MG/5ML  elixir Commonly known as:  BENADRYL Take 5-10 mLs (12.5-25 mg total) by mouth every 4 (four) hours as needed for itching.   docusate sodium 100 MG capsule Commonly known as:  COLACE Take 1 capsule (100 mg total) by mouth 2 (two) times daily.   furosemide 40 MG tablet Commonly known as:  LASIX Take 40 mg by mouth daily as needed for fluid or edema.   gabapentin 300 MG capsule Commonly known as:  NEURONTIN Take 1 capsule (300 mg total) by mouth 3 (three) times daily. What changed:  how much to take   glimepiride 4 MG tablet Commonly known as:  AMARYL Take 1 tablet (4 mg total) by mouth 2 times daily at 12 noon and 4 pm.   hydrALAZINE 50 MG tablet Commonly known as:  APRESOLINE Take 50 mg by mouth twice daily   hydrochlorothiazide 25 MG tablet Commonly known as:  HYDRODIURIL Take 25 mg by mouth every morning.   HYDROcodone-acetaminophen 7.5-325 MG tablet Commonly known as:  NORCO Take 2 tablets by mouth every 4 (four) hours as needed for severe pain (pain score 7-10). Replaces:  HYDROcodone-acetaminophen 5-325 MG tablet   loratadine 10 MG tablet Commonly known as:  CLARITIN Take 1 tablet (10 mg total) by mouth daily.   losartan 50 MG tablet Commonly known as:  COZAAR Take 50 mg by mouth at bedtime.   magic mouthwash Soln Take 5 mLs by mouth 4 (four) times daily as needed for mouth pain (thrush). What changed:  Another medication with the same name was added. Make sure you understand how and when to take each.   magic mouthwash Soln Take 5 mLs by mouth 4 (four) times daily as needed for mouth pain (thrush). What changed:  You were already taking a medication with the same name, and this prescription was added. Make sure you understand how and when to take each.   multivitamins with iron Tabs tablet Take 1 tablet by mouth daily.   potassium bicarbonate 25 MEQ disintegrating tablet Commonly known as:  K-LYTE Take 25 mEq by mouth daily as needed (swelling). With  lasix   VICTOZA 18 MG/3ML Sopn Generic drug:  liraglutide Inject 0.6 mg once daily       Diagnostic Studies: Dg Chest 2 View  Result Date: 06/25/2017 CLINICAL DATA:  77 year old female. Preop for right knee replacement. EXAM: CHEST - 2 VIEW COMPARISON:  Chest radiograph dated 04/16/2017 FINDINGS: The lungs are clear. There is no pleural effusion or pneumothorax. The cardiac silhouette is within normal limits. No acute osseous pathology. Atherosclerotic calcification of the aorta. IMPRESSION: No active cardiopulmonary disease. Electronically Signed  By: Anner Crete M.D.   On: 06/25/2017 06:37    Disposition: Discharge disposition: 03-Skilled Nursing Facility       Discharge Instructions    Call MD / Call 911   Complete by:  As directed    If you experience chest pain or shortness of breath, CALL 911 and be transported to the hospital emergency room.  If you develope a fever above 101 F, pus (white drainage) or increased drainage or redness at the wound, or calf pain, call your surgeon's office.   Constipation Prevention   Complete by:  As directed    Drink plenty of fluids.  Prune juice may be helpful.  You may use a stool softener, such as Colace (over the counter) 100 mg twice a day.  Use MiraLax (over the counter) for constipation as needed.   Diet - low sodium heart healthy   Complete by:  As directed    Discharge instructions   Complete by:  As directed    Keep knee incision dry for 5 days post op then may wet while bathing. Therapy daily and CPM goal full extension and greater than 90 degrees flexion. Call if fever or chills or increased drainage. Go to ER if acutely short of breath or call for ambulance. Return for follow up in 2 weeks. May full weight bear on the surgical leg unless told otherwise. Use knee immobilizer until able to straight leg raise off bed with knee stable. In house walking for first 2 weeks.  Follow up with urology in one week for a voiding  trial, Dr. Zannie Cove.   INSTRUCTIONS AFTER JOINT REPLACEMENT   Remove items at home which could result in a fall. This includes throw rugs or furniture in walking pathways ICE to the affected joint every three hours while awake for 30 minutes at a time, for at least the first 3-5 days, and then as needed for pain and swelling.  Continue to use ice for pain and swelling. You may notice swelling that will progress down to the foot and ankle.  This is normal after surgery.  Elevate your leg when you are not up walking on it.   Continue to use the breathing machine you got in the hospital (incentive spirometer) which will help keep your temperature down.  It is common for your temperature to cycle up and down following surgery, especially at night when you are not up moving around and exerting yourself.  The breathing machine keeps your lungs expanded and your temperature down.   DIET:  As you were doing prior to hospitalization, we recommend a well-balanced diet.  DRESSING / WOUND CARE / SHOWERING  You may change your dressing 3-5 days after surgery.  Then change the dressing every day with sterile gauze.  Please use good hand washing techniques before changing the dressing.  Do not use any lotions or creams on the incision until instructed by your surgeon.  ACTIVITY  Increase activity slowly as tolerated, but follow the weight bearing instructions below.   No driving for 6 weeks or until further direction given by your physician.  You cannot drive while taking narcotics.  No lifting or carrying greater than 10 lbs. until further directed by your surgeon. Avoid periods of inactivity such as sitting longer than an hour when not asleep. This helps prevent blood clots.  You may return to work once you are authorized by your doctor.     WEIGHT BEARING   Weight bearing as tolerated with  assist device (walker, cane, etc) as directed, use it as long as suggested by your surgeon or therapist,  typically at least 4-6 weeks.   EXERCISES  Results after joint replacement surgery are often greatly improved when you follow the exercise, range of motion and muscle strengthening exercises prescribed by your doctor. Safety measures are also important to protect the joint from further injury. Any time any of these exercises cause you to have increased pain or swelling, decrease what you are doing until you are comfortable again and then slowly increase them. If you have problems or questions, call your caregiver or physical therapist for advice.   Rehabilitation is important following a joint replacement. After just a few days of immobilization, the muscles of the leg can become weakened and shrink (atrophy).  These exercises are designed to build up the tone and strength of the thigh and leg muscles and to improve motion. Often times heat used for twenty to thirty minutes before working out will loosen up your tissues and help with improving the range of motion but do not use heat for the first two weeks following surgery (sometimes heat can increase post-operative swelling).   These exercises can be done on a training (exercise) mat, on the floor, on a table or on a bed. Use whatever works the best and is most comfortable for you.    Use music or television while you are exercising so that the exercises are a pleasant break in your day. This will make your life better with the exercises acting as a break in your routine that you can look forward to.   Perform all exercises about fifteen times, three times per day or as directed.  You should exercise both the operative leg and the other leg as well.  Exercises include:   Quad Sets - Tighten up the muscle on the front of the thigh (Quad) and hold for 5-10 seconds.   Straight Leg Raises - With your knee straight (if you were given a brace, keep it on), lift the leg to 60 degrees, hold for 3 seconds, and slowly lower the leg.  Perform this exercise  against resistance later as your leg gets stronger.  Leg Slides: Lying on your back, slowly slide your foot toward your buttocks, bending your knee up off the floor (only go as far as is comfortable). Then slowly slide your foot back down until your leg is flat on the floor again.  Angel Wings: Lying on your back spread your legs to the side as far apart as you can without causing discomfort.  Hamstring Strength:  Lying on your back, push your heel against the floor with your leg straight by tightening up the muscles of your buttocks.  Repeat, but this time bend your knee to a comfortable angle, and push your heel against the floor.  You may put a pillow under the heel to make it more comfortable if necessary.   A rehabilitation program following joint replacement surgery can speed recovery and prevent re-injury in the future due to weakened muscles. Contact your doctor or a physical therapist for more information on knee rehabilitation.    CONSTIPATION  Constipation is defined medically as fewer than three stools per week and severe constipation as less than one stool per week.  Even if you have a regular bowel pattern at home, your normal regimen is likely to be disrupted due to multiple reasons following surgery.  Combination of anesthesia, postoperative narcotics, change in appetite and  fluid intake all can affect your bowels.   YOU MUST use at least one of the following options; they are listed in order of increasing strength to get the job done.  They are all available over the counter, and you may need to use some, POSSIBLY even all of these options:    Drink plenty of fluids (prune juice may be helpful) and high fiber foods Colace 100 mg by mouth twice a day  Senokot for constipation as directed and as needed Dulcolax (bisacodyl), take with full glass of water  Miralax (polyethylene glycol) once or twice a day as needed.  If you have tried all these things and are unable to have a bowel  movement in the first 3-4 days after surgery call either your surgeon or your primary doctor.    If you experience loose stools or diarrhea, hold the medications until you stool forms back up.  If your symptoms do not get better within 1 week or if they get worse, check with your doctor.  If you experience "the worst abdominal pain ever" or develop nausea or vomiting, please contact the office immediately for further recommendations for treatment.   ITCHING:  If you experience itching with your medications, try taking only a single pain pill, or even half a pain pill at a time.  You can also use Benadryl over the counter for itching or also to help with sleep.   TED HOSE STOCKINGS:  Use stockings on both legs until for at least 2 weeks or as directed by physician office. They may be removed at night for sleeping.  MEDICATIONS:  See your medication summary on the "After Visit Summary" that nursing will review with you.  You may have some home medications which will be placed on hold until you complete the course of blood thinner medication.  It is important for you to complete the blood thinner medication as prescribed.  PRECAUTIONS:  If you experience chest pain or shortness of breath - call 911 immediately for transfer to the hospital emergency department.   If you develop a fever greater that 101 F, purulent drainage from wound, increased redness or drainage from wound, foul odor from the wound/dressing, or calf pain - CONTACT YOUR SURGEON.                                                   FOLLOW-UP APPOINTMENTS:  If you do not already have a post-op appointment, please call the office for an appointment to be seen by your surgeon.  Guidelines for how soon to be seen are listed in your "After Visit Summary", but are typically between 1-4 weeks after surgery.  OTHER INSTRUCTIONS:   Knee Replacement:  Do not place pillow under knee, focus on keeping the knee straight while resting. CPM  instructions: 0-90 degrees, 2 hours in the morning, 2 hours in the afternoon, and 2 hours in the evening. Place foam block, curve side up under heel at all times except when in CPM or when walking.  DO NOT modify, tear, cut, or change the foam block in any way.  MAKE SURE YOU:  Understand these instructions.  Get help right away if you are not doing well or get worse.    Thank you for letting us be a part of your medical care team.  It is a  privilege we respect greatly.  We hope these instructions will help you stay on track for a fast and full recovery!   Driving restrictions   Complete by:  As directed    No driving.   Increase activity slowly as tolerated   Complete by:  As directed    Lifting restrictions   Complete by:  As directed    No lifting for 12 weeks      Follow-up Information    Jessy Oto, MD In 2 weeks.   Specialty:  Orthopedic Surgery Why:  For wound re-check, For staple removal Contact information: [place] [address] [phone]        [facility] Follow up.   Why:  follow-up next week for a voiding trial.  Call for appointment Contact information: [place] [phone]           Signed: [name] 06/28/2017, 8:58 AM

## 2017-06-27 NOTE — Plan of Care (Signed)

## 2017-06-28 DIAGNOSIS — N390 Urinary tract infection, site not specified: Secondary | ICD-10-CM | POA: Diagnosis present

## 2017-06-28 DIAGNOSIS — D500 Iron deficiency anemia secondary to blood loss (chronic): Secondary | ICD-10-CM | POA: Diagnosis not present

## 2017-06-28 DIAGNOSIS — R338 Other retention of urine: Secondary | ICD-10-CM

## 2017-06-28 DIAGNOSIS — N9989 Other postprocedural complications and disorders of genitourinary system: Secondary | ICD-10-CM | POA: Diagnosis not present

## 2017-06-28 LAB — CBC
HEMATOCRIT: 28.3 % — AB (ref 36.0–46.0)
Hemoglobin: 8.7 g/dL — ABNORMAL LOW (ref 12.0–15.0)
MCH: 26.7 pg (ref 26.0–34.0)
MCHC: 30.7 g/dL (ref 30.0–36.0)
MCV: 86.8 fL (ref 78.0–100.0)
PLATELETS: 158 10*3/uL (ref 150–400)
RBC: 3.26 MIL/uL — ABNORMAL LOW (ref 3.87–5.11)
RDW: 15.9 % — AB (ref 11.5–15.5)
WBC: 7.2 10*3/uL (ref 4.0–10.5)

## 2017-06-28 LAB — GLUCOSE, CAPILLARY
Glucose-Capillary: 142 mg/dL — ABNORMAL HIGH (ref 70–99)
Glucose-Capillary: 129 mg/dL — ABNORMAL HIGH (ref 70–99)

## 2017-06-28 MED ORDER — HYDROCODONE-ACETAMINOPHEN 7.5-325 MG PO TABS: 2.0000 | ORAL_TABLET | ORAL | 0 refills | Status: DC | PRN

## 2017-06-28 MED ORDER — TAB-A-VITE/IRON PO TABS: 1.0000 | ORAL_TABLET | Freq: Every day | ORAL | 0 refills | Status: DC

## 2017-06-28 MED ORDER — TAB-A-VITE/IRON PO TABS
1.0000 | ORAL_TABLET | Freq: Every day | ORAL | Status: DC
  Filled 2017-06-28: qty 1

## 2017-06-28 MED ORDER — MAGIC MOUTHWASH: 5.0000 mL | Freq: Four times a day (QID) | ORAL | 0 refills | Status: DC | PRN

## 2017-06-28 MED ORDER — DESOXIMETASONE 0.05 % EX GEL: 1.0000 "application " | Freq: Every day | CUTANEOUS | 0 refills | Status: AC | PRN

## 2017-06-28 NOTE — Clinical Social Work Placement (Signed)
   CLINICAL SOCIAL WORK PLACEMENT  NOTE  Date:  06/28/2017  Patient Details  Name: Angelica French MRN: [id number] Date of Birth: 31-Oct-1940  Clinical Social Work is seeking post-discharge placement for this patient at the [place] level of care (*CSW will initial, date and re-position this form in  chart as items are completed):  Yes   Patient/family provided with [place] Work Department's list of facilities offering this level of care within the geographic area requested by the patient (or if unable, by the patient's family).  Yes   Patient/family informed of their freedom to choose among providers that offer the needed level of care, that participate in Medicare, Medicaid or managed care program needed by the patient, have an available bed and are willing to accept the patient.  Yes   Patient/family informed of [facility]'s ownership interest in [facility] and [facility], as well as of the fact that they are under no obligation to receive care at these facilities.  PASRR submitted to EDS on       PASRR number received on       Existing PASRR number confirmed on 06/26/17     FL2 transmitted to all facilities in geographic area requested by pt/family on       FL2 transmitted to all facilities within larger geographic area on       Patient informed that his/her managed care company has contracts with or will negotiate with certain facilities, including the following:        Yes   Patient/family informed of bed offers received.  Patient chooses bed at [facility]     Physician recommends and patient chooses bed at      Patient to be transferred to [facility] on 06/28/17.  Patient to be transferred to facility by [facility]     Patient family notified on 06/28/17 of transfer.  Name of family member notified:  spouse contacted     PHYSICIAN       Additional Comment:    _______________________________________________ Normajean Baxter, LCSW 06/28/2017, 10:07 AM

## 2017-06-28 NOTE — Progress Notes (Signed)
     Subjective: 3 Days Post-Op Procedure(s) (LRB): RIGHT TOTAL KNEE ARTHROPLASTY (Right) Awake,alert and oriented x 4. My pain is controlled with 2 of my hydrocodones, I can't take the morphine.   Patient reports pain as moderate.    Objective:   VITALS:  Temp:  [97.8 F (36.6 C)-98.6 F (37 C)] 98.2 F (36.8 C) (06/27 0626) Pulse Rate:  [66-95] 66 (06/27 0626) Resp:  [16-18] 17 (06/27 0626) BP: (94-156)/(43-81) 94/43 (06/27 0626) SpO2:  [96 %-100 %] 98 % (06/27 0626)  Neurologically intact ABD soft Neurovascular intact Sensation intact distally Intact pulses distally Dorsiflexion/Plantar flexion intact Incision: no drainage No cellulitis present Compartment soft   LABS Recent Labs    06/26/17 0609 06/27/17 0341 06/28/17 0441  HGB 9.5* 9.0* 8.7*  WBC 10.7* 11.5* 7.2  PLT 160 174 158   Recent Labs    06/26/17 0609 06/27/17 0341  NA 135 135  K 4.5 4.3  CL 106 106  CO2 21* 21*  BUN 32* 36*  CREATININE 1.33* 1.29*  GLUCOSE 211* 134*   No results for input(s): LABPT, INR in the last 72 hours.   Assessment/Plan: 3 Days Post-Op Procedure(s) (LRB): RIGHT TOTAL KNEE ARTHROPLASTY (Right)  Anemia due to blood loss Urinary Retention  Advance diet Up with therapy Discharge to SNF  Will need follow up with Dr.Dalhstedt to remove catheter in one week.  Multivitamin tablet daily with iron.   Angelica French 06/28/2017, 8:37 AMPatient ID: Angelica French, female   DOB: 07-Feb-1940, 77 y.o.   MRN: [id number]

## 2017-06-28 NOTE — Social Work (Signed)
Clinical Social Worker facilitated patient discharge including contacting patient family and facility to confirm patient discharge plans.  Clinical information faxed to facility and family agreeable with plan.    CSW arranged ambulance transport via [facility] to [facility] .    RN to call [phone] to give report prior to discharge. Pt going to Room 702P.  Clinical Social Worker will sign off for now as social work intervention is no longer needed. Please consult [place] again if new need arises.  Elissa Hefty, LCSW Clinical Social Worker [phone]

## 2017-06-28 NOTE — Care Management Important Message (Signed)
Important Message  Patient Details  Name: Angelica French MRN: [id number] Date of Birth: 30-Oct-1940   Medicare Important Message Given:  Yes    Orbie Pyo 06/28/2017, 2:31 PM

## 2017-06-28 NOTE — Progress Notes (Signed)
Pt being transported to [facility] via [place]. IV removed, pain medicine given, ostomy bag changed, report given to nurse at [facility].

## 2017-06-28 NOTE — Progress Notes (Signed)
Physical Therapy Treatment Patient Details Name: Angelica French MRN: [id number] DOB: 01/29/1940 Today's Date: 06/28/2017    History of Present Illness 77 y.o. female admitted on 06/25/17 for elective R TKA.  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 Pt with post op urinary retention requiring in and out catheterizations    PT Comments    Pt with more c/o knee soreness this session, ice applied after session. Pt continues to be min A with mobilty and short distance gait. Current plan is appropriate to d/c to SNF for continued strength and mobility training.  Follow Up Recommendations  SNF     Equipment Recommendations  None recommended by PT    Recommendations for Other Services       Precautions / Restrictions Precautions Precautions: Knee Required Braces or Orthoses: Knee Immobilizer - Right Restrictions RLE Weight Bearing: Weight bearing as tolerated    Mobility  Bed Mobility   Bed Mobility: Supine to Sit     Supine to sit: Min assist Sit to supine: Min assist   General bed mobility comments: assist for Rt LE to edge of bed  Transfers Overall transfer level: Needs assistance Equipment used: Rolling walker (2 wheeled) Transfers: Sit to/from Stand Sit to Stand: Min assist         General transfer comment: cues for UE placement  Ambulation/Gait Ambulation/Gait assistance: Min assist Gait Distance (Feet): 15 Feet Assistive device: Rolling walker (2 wheeled) Gait Pattern/deviations: Step-to pattern;Antalgic     General Gait Details: pt requires heavier min A during turns during gait   Stairs             Wheelchair Mobility    Modified Rankin (Stroke Patients Only)       Balance                                            Cognition Arousal/Alertness: Awake/alert Behavior During Therapy: WFL for tasks assessed/performed Overall Cognitive Status: Within  Functional Limits for tasks assessed                                        Exercises Total Joint Exercises Ankle Circles/Pumps: AROM;Both;20 reps Quad Sets: AROM;Right;10 reps Short Arc Quad: AROM;Right;10 reps Heel Slides: AAROM;Right;10 reps Hip ABduction/ADduction: AROM;Right;10 reps    General Comments        Pertinent Vitals/Pain Faces Pain Scale: Hurts little more Pain Location: right knee Pain Descriptors / Indicators: Aching Pain Intervention(s): Ice applied;Repositioned;Limited activity within patient's tolerance    Home Living                      Prior Function            PT Goals (current goals can now be found in the care plan section) Acute Rehab PT Goals Patient Stated Goal: to go to rehab and then home Progress towards PT goals: Progressing toward goals    Frequency    7X/week      PT Plan Current plan remains appropriate    Co-evaluation              AM-PAC PT "6 Clicks" Daily Activity  Outcome Measure  Difficulty turning over in bed (including  adjusting bedclothes, sheets and blankets)?: A Little Difficulty moving from lying on back to sitting on the side of the bed? : A Little Difficulty sitting down on and standing up from a chair with arms (e.g., wheelchair, bedside commode, etc,.)?: A Little Help needed moving to and from a bed to chair (including a wheelchair)?: A Little Help needed walking in hospital room?: A Little Help needed climbing 3-5 steps with a railing? : A Little 6 Click Score: 18    End of Session Equipment Utilized During Treatment: Right knee immobilizer Activity Tolerance: Patient tolerated treatment well Patient left: in bed;with call bell/phone within reach;with nursing/sitter in room Nurse Communication: Mobility status PT Visit Diagnosis: Difficulty in walking, not elsewhere classified (R26.2);Muscle weakness (generalized) (M62.81);Pain Pain - Right/Left: Right Pain - part of  body: Knee     Time: 9597-4718 PT Time Calculation (min) (ACUTE ONLY): 31 min  Charges:  $Gait Training: 8-22 mins $Therapeutic Exercise: 8-22 mins                    G Codes:       Isabelle Course PT, DPT   Cristina Ceniceros 06/28/2017, 10:37 AM

## 2017-06-29 LAB — TYPE AND SCREEN
ABO/RH(D): A POS
ANTIBODY SCREEN: POSITIVE
DONOR AG TYPE: NEGATIVE
DONOR AG TYPE: NEGATIVE
PT AG Type: NEGATIVE
Unit division: 0
Unit division: 0

## 2017-06-29 LAB — BPAM RBC
BLOOD PRODUCT EXPIRATION DATE: 201907152359
BLOOD PRODUCT EXPIRATION DATE: 201907152359
Unit Type and Rh: 6200
Unit Type and Rh: 6200

## 2017-07-09 ENCOUNTER — Encounter (INDEPENDENT_AMBULATORY_CARE_PROVIDER_SITE_OTHER): Payer: Self-pay | Admitting: Specialist

## 2017-07-09 ENCOUNTER — Ambulatory Visit (INDEPENDENT_AMBULATORY_CARE_PROVIDER_SITE_OTHER): Payer: No Typology Code available for payment source | Admitting: Specialist

## 2017-07-09 ENCOUNTER — Ambulatory Visit (INDEPENDENT_AMBULATORY_CARE_PROVIDER_SITE_OTHER): Payer: No Typology Code available for payment source

## 2017-07-09 ENCOUNTER — Telehealth (INDEPENDENT_AMBULATORY_CARE_PROVIDER_SITE_OTHER): Payer: Self-pay | Admitting: Specialist

## 2017-07-09 VITALS — BP 135/66 | HR 70 | Ht 60.0 in | Wt 134.0 lb

## 2017-07-09 DIAGNOSIS — Z96651 Presence of right artificial knee joint: Secondary | ICD-10-CM | POA: Diagnosis not present

## 2017-07-09 MED ORDER — HYDROCODONE-ACETAMINOPHEN 7.5-325 MG PO TABS: 2.0000 | ORAL_TABLET | ORAL | 0 refills | Status: DC | PRN

## 2017-07-09 NOTE — Progress Notes (Signed)
Post-Op Visit Note   Patient: Angelica French           Date of Birth: [birth year]           MRN: [id number] Visit Date: 07/09/2017 PCP: Thornton Dales I, MD   Assessment & Plan:Right knee replacement 2 weeks post op, left TKR 3 months post op.  Chief Complaint:  Chief Complaint  Patient presents with  . Right Knee - Wound Check   Visit Diagnoses:  1. S/P TKR (total knee replacement) using cement, right   Staples right anterior knee intact, incision is healed,  ROM 5-90 degrees  Plan: Discontinue use of TED hose. Continue with aspirin for anticoaguation therapy, anti DVT.  PT to continue with quadriceps strengthening, May discontinue the use of th knee immobilizer as she can raise her right leg extending the right knee without the knee immobilizer. May bathe as needed. Full weight bearing on the right and left leg. CPM 8 hours a day in 3-4 sessions total 8 hours.  Use Ice after PT and heat to the right thigh before. Try to target 7/20 for return to home as she will need to be able to assist her husband when she goes home.   Follow-Up Instructions: No follow-ups on file.   Orders:  Orders Placed This Encounter  Procedures  . XR Knee 1-2 Views Right   No orders of the defined types were placed in this encounter.   Imaging: No results found.  PMFS History: Patient Active Problem List   Diagnosis Date Noted  . Postoperative urinary retention 06/28/2017    Priority: High    Class: Acute  . Urinary tract infection 06/28/2017    Priority: High    Class: Present on Admission  . Anemia due to blood loss 06/28/2017    Priority: High    Class: Acute  . Bilateral primary osteoarthritis of knee 04/13/2017    Priority: High    Class: Chronic  . S/P TKR (total knee replacement) using cement, right 06/25/2017  . Diabetes mellitus type 2 without retinopathy ([place]) 06/04/2017  . Status post left knee replacement 04/13/2017  . Dry eye syndrome of both lacrimal glands  05/25/2016  . Myopia with astigmatism and presbyopia, bilateral 05/25/2016  . Posterior vitreous detachment of both eyes 05/25/2016  . Refractive amblyopia of left eye 05/25/2016  . Diabetes mellitus without complication ([place]) 13/08/6576  . Depression 03/01/2016  . Hypokalemia 08/31/2015  . CKD (chronic kidney disease) stage 3, GFR 30-59 ml/min (HCC) 07/08/2015  . Type 2 diabetes mellitus with diabetic chronic kidney disease ([place]) 06/29/2015  . Atrophy of right kidney 04/26/2015  . Benign hypertension with CKD (chronic kidney disease) stage III ([place]) 03/22/2015  . Mixed hyperlipidemia 03/22/2015  . Renal artery stenosis of unknown cause ([place]) 02/17/2015  . Right renal artery stenosis ([place]) 02/17/2015  . Hyperlipidemia 09/16/2014  . Type 2 diabetes mellitus ([place]) 09/16/2014   Past Medical History:  Diagnosis Date  . Arthritis   . Bleeding ulcer   . CHF (congestive heart failure) ([place])   . Chronic kidney disease    Renal artery stenosis - right. Left kindey ok- sees Dr Neta Ehlers- nepjrologist  . Diabetes mellitus without complication ([place])    type II  . Gastric ulcer 1994   bleeding  . History of blood transfusion    1970- illeostomy 2004-  . Hypertension   . UC (ulcerative colitis) ([place])   . VRE (vancomycin resistant enterococcus) culture positive 2004    Family  History  Problem Relation Age of Onset  . Heart disease Father     Past Surgical History:  Procedure Laterality Date  . ABDOMINAL HYSTERECTOMY    . ABSCESS DRAINAGE     abdominal  . BACK SURGERY Left 1997   Left 5 DISECTOMY  . BLADDER SUSPENSION  1998  . EYE SURGERY Bilateral    cataract  . HERNIA MESH REMOVAL  2004   relocation of ileostomy to opposite side of abdomen  . PERMANENT ILEOSTOMY  1970  . stoma hernia  2004   attempted- resulting in perforated bowel and infection  . stoma; Hernia repair     2003- attempted  . TOTAL KNEE ARTHROPLASTY Left 04/13/2017   Procedure: LEFT TOTAL KNEE ARTHROPLASTY AND  RIGHT KNEE INTRA-ARTICULAR STEROID INJECTION;  Surgeon: Jessy Oto, MD;  Location: [facility];  Service: Orthopedics;  Laterality: Left;  . TOTAL KNEE ARTHROPLASTY Right 06/25/2017  . TOTAL KNEE ARTHROPLASTY Right 06/25/2017   Procedure: RIGHT TOTAL KNEE ARTHROPLASTY;  Surgeon: Jessy Oto, MD;  Location: [facility];  Service: Orthopedics;  Laterality: Right;   Social History   Occupational History  . Not on file  Tobacco Use  . Smoking status: Never Smoker  . Smokeless tobacco: Never Used  Substance and Sexual Activity  . Alcohol use: Yes    Comment: occasional drink with liqueor  . Drug use: Never  . Sexual activity: Not on file

## 2017-07-09 NOTE — Patient Instructions (Signed)
Plan: Discontinue use of TED hose. Continue with aspirin for anticoaguation therapy, anti DVT.  PT to continue with quadriceps strengthening, May discontinue the use of th knee immobilizer as she can raise her right leg extending the right knee without the knee immobilizer. May bathe as needed. Full weight bearing on the right and left leg. CPM 8 hours a day in 3-4 sessions total 8 hours.  Use Ice after PT and heat to the right thigh before. Try to target 7/20 for return to home as she will need to be able to assist her husband when she goes home.

## 2017-07-09 NOTE — Telephone Encounter (Signed)
Put her on the cancellation list.

## 2017-07-09 NOTE — Telephone Encounter (Signed)
Please put patient on cancellation list for 1 month follow up for Dr. Louanne Skye if something opens around the beginning of August. Patients # [phone]

## 2017-07-12 ENCOUNTER — Inpatient Hospital Stay (INDEPENDENT_AMBULATORY_CARE_PROVIDER_SITE_OTHER): Payer: Medicare Other | Admitting: Surgery

## 2017-07-31 ENCOUNTER — Telehealth (INDEPENDENT_AMBULATORY_CARE_PROVIDER_SITE_OTHER): Payer: Self-pay | Admitting: Specialist

## 2017-07-31 NOTE — Telephone Encounter (Signed)
Franco Nones  [phone]    Please call scott to discuss pt care  [place]

## 2017-08-01 ENCOUNTER — Telehealth (INDEPENDENT_AMBULATORY_CARE_PROVIDER_SITE_OTHER): Payer: Self-pay | Admitting: Specialist

## 2017-08-01 NOTE — Telephone Encounter (Signed)
HHT 07/31/17 Evaluation Olen Cordial  [phone]     Verbal Orders HHT   Twice a week for one week   Three times a week  for one week   Two times a week for two weeks

## 2017-08-01 NOTE — Telephone Encounter (Signed)
[name]-(OT) with [facility] called left voicemail message stating patient is a HHOT eval only and patient doesn't require any additional visits at this time. The number to contact Ailene Ravel is [phone]

## 2017-08-01 NOTE — Telephone Encounter (Signed)
I called and gave verbal orders for HHPT orders to [facility]

## 2017-08-02 NOTE — Telephone Encounter (Signed)
Noted. Thanks.

## 2017-08-20 ENCOUNTER — Ambulatory Visit (INDEPENDENT_AMBULATORY_CARE_PROVIDER_SITE_OTHER): Payer: Medicare Other | Admitting: Specialist

## 2017-08-20 ENCOUNTER — Encounter (INDEPENDENT_AMBULATORY_CARE_PROVIDER_SITE_OTHER): Payer: Self-pay | Admitting: Specialist

## 2017-08-20 VITALS — BP 132/60 | HR 54 | Ht 60.0 in | Wt 134.0 lb

## 2017-08-20 DIAGNOSIS — Z96651 Presence of right artificial knee joint: Secondary | ICD-10-CM

## 2017-08-20 MED ORDER — HYDROCODONE-ACETAMINOPHEN 7.5-325 MG PO TABS: 2.0000 | ORAL_TABLET | ORAL | 0 refills | Status: DC | PRN

## 2017-08-20 NOTE — Patient Instructions (Addendum)
Plan: Continue with PT for the right knee at outpatient, at [facility]. Use Heat before therapy to the right thigh anteriorly, then ice after therapy. Take pain meds prior to PT. Take baby aspirin one tablet po daily.

## 2017-08-20 NOTE — Progress Notes (Signed)
Post-Op Visit Note   Patient: Angelica French           Date of Birth: 1940/09/25           MRN: [id number] Visit Date: 08/20/2017 PCP: Thornton Dales I, MD   Assessment & Plan: 2 months post op right TKR doing well, Plan: Continue with PT for the right knee at outpatient, at [facility]. Use Heat before therapy to the right thigh anteriorly, then ice after therapy. Take pain meds prior to PT. Take baby aspirin one tablet po daily.  Chief Complaint: 8 weeks post right TKR, 3 month post left TKR. Chief Complaint  Patient presents with  . Right Knee - Routine Post Op   Visit Diagnoses:  1. Total knee replacement status, right   Incision is healed, minimal warmth, no erythrema. ROM right knee is 10-1118 ROM left knee is 0-130  Plan: Continue with PT for the right knee at outpatient, at [facility]. Use Heat before therapy to the right thigh anteriorly, then ice after therapy. Take pain meds prior to PT. Take baby aspirin one tablet po daily.  Follow-Up Instructions: No follow-ups on file.   Orders:  No orders of the defined types were placed in this encounter.  No orders of the defined types were placed in this encounter.   Imaging: No results found.  PMFS History: Patient Active Problem List   Diagnosis Date Noted  . Postoperative urinary retention 06/28/2017    Priority: High    Class: Acute  . Urinary tract infection 06/28/2017    Priority: High    Class: Present on Admission  . Anemia due to blood loss 06/28/2017    Priority: High    Class: Acute  . Bilateral primary osteoarthritis of knee 04/13/2017    Priority: High    Class: Chronic  . S/P TKR (total knee replacement) using cement, right 06/25/2017  . Diabetes mellitus type 2 without retinopathy ([place]) 06/04/2017  . Status post left knee replacement 04/13/2017  . Dry eye syndrome of both lacrimal glands 05/25/2016  . Myopia with astigmatism and presbyopia, bilateral 05/25/2016  .  Posterior vitreous detachment of both eyes 05/25/2016  . Refractive amblyopia of left eye 05/25/2016  . Diabetes mellitus without complication ([place]) 29/79/8921  . Depression 03/01/2016  . Hypokalemia 08/31/2015  . CKD (chronic kidney disease) stage 3, GFR 30-59 ml/min (HCC) 07/08/2015  . Type 2 diabetes mellitus with diabetic chronic kidney disease ([place]) 06/29/2015  . Atrophy of right kidney 04/26/2015  . Benign hypertension with CKD (chronic kidney disease) stage III ([place]) 03/22/2015  . Mixed hyperlipidemia 03/22/2015  . Renal artery stenosis of unknown cause ([place]) 02/17/2015  . Right renal artery stenosis ([place]) 02/17/2015  . Hyperlipidemia 09/16/2014  . Type 2 diabetes mellitus ([place]) 09/16/2014   Past Medical History:  Diagnosis Date  . Arthritis   . Bleeding ulcer   . CHF (congestive heart failure) ([place])   . Chronic kidney disease    Renal artery stenosis - right. Left kindey ok- sees Dr Neta Ehlers- nepjrologist  . Diabetes mellitus without complication ([place])    type II  . Gastric ulcer 1994   bleeding  . History of blood transfusion    1970- illeostomy 2004-  . Hypertension   . UC (ulcerative colitis) ([place])   . VRE (vancomycin resistant enterococcus) culture positive 2004    Family History  Problem Relation Age of Onset  . Heart disease Father     Past Surgical History:  Procedure Laterality Date  . ABDOMINAL HYSTERECTOMY    . ABSCESS DRAINAGE     abdominal  . BACK SURGERY Left 1997   Left 5 DISECTOMY  . BLADDER SUSPENSION  1998  . EYE SURGERY Bilateral    cataract  . HERNIA MESH REMOVAL  2004   relocation of ileostomy to opposite side of abdomen  . PERMANENT ILEOSTOMY  1970  . stoma hernia  2004   attempted- resulting in perforated bowel and infection  . stoma; Hernia repair     2003- attempted  . TOTAL KNEE ARTHROPLASTY Left 04/13/2017   Procedure: LEFT TOTAL KNEE ARTHROPLASTY AND RIGHT KNEE INTRA-ARTICULAR STEROID INJECTION;  Surgeon: Jessy Oto, MD;   Location: [facility];  Service: Orthopedics;  Laterality: Left;  . TOTAL KNEE ARTHROPLASTY Right 06/25/2017  . TOTAL KNEE ARTHROPLASTY Right 06/25/2017   Procedure: RIGHT TOTAL KNEE ARTHROPLASTY;  Surgeon: Jessy Oto, MD;  Location: [facility];  Service: Orthopedics;  Laterality: Right;   Social History   Occupational History  . Not on file  Tobacco Use  . Smoking status: Never Smoker  . Smokeless tobacco: Never Used  Substance and Sexual Activity  . Alcohol use: Yes    Comment: occasional drink with liqueor  . Drug use: Never  . Sexual activity: Not on file

## 2017-08-24 ENCOUNTER — Ambulatory Visit (INDEPENDENT_AMBULATORY_CARE_PROVIDER_SITE_OTHER): Payer: Medicare Other | Admitting: Specialist

## 2017-09-07 ENCOUNTER — Telehealth (INDEPENDENT_AMBULATORY_CARE_PROVIDER_SITE_OTHER): Payer: Self-pay | Admitting: Specialist

## 2017-09-07 NOTE — Telephone Encounter (Signed)
[facility]  No number provided   Shanon Brow called to inform Dr.Nitka that patient missed PT visit

## 2017-09-07 NOTE — Telephone Encounter (Signed)
noted 

## 2017-09-14 ENCOUNTER — Telehealth (INDEPENDENT_AMBULATORY_CARE_PROVIDER_SITE_OTHER): Payer: Self-pay | Admitting: Specialist

## 2017-09-14 NOTE — Telephone Encounter (Signed)
noted 

## 2017-09-14 NOTE — Telephone Encounter (Signed)
Angelica French from [place] call to state patient missed PT today due to family issues. Hw will try and r/s for next week

## 2017-09-24 ENCOUNTER — Encounter (INDEPENDENT_AMBULATORY_CARE_PROVIDER_SITE_OTHER): Payer: Self-pay | Admitting: Specialist

## 2017-09-24 ENCOUNTER — Ambulatory Visit (INDEPENDENT_AMBULATORY_CARE_PROVIDER_SITE_OTHER): Payer: Medicare Other | Admitting: Specialist

## 2017-09-24 VITALS — BP 152/67 | HR 57 | Ht 60.0 in | Wt 129.0 lb

## 2017-09-24 DIAGNOSIS — Z96653 Presence of artificial knee joint, bilateral: Secondary | ICD-10-CM

## 2017-09-24 DIAGNOSIS — R202 Paresthesia of skin: Secondary | ICD-10-CM

## 2017-09-24 DIAGNOSIS — R200 Anesthesia of skin: Secondary | ICD-10-CM

## 2017-09-24 MED ORDER — HYDROCODONE-ACETAMINOPHEN 7.5-325 MG PO TABS: 1.0000 | ORAL_TABLET | Freq: Four times a day (QID) | ORAL | 0 refills | Status: DC | PRN

## 2017-09-24 MED ORDER — GABAPENTIN 300 MG PO CAPS: 600.0000 mg | ORAL_CAPSULE | Freq: Three times a day (TID) | ORAL | 3 refills | Status: DC

## 2017-09-24 NOTE — Patient Instructions (Signed)
Work on knee ROM and light strengthening. Ice or heat as needed.  Hydrocodone one every 6 hours prn pain this will be your last prescription for a narcotic. Gabapentin for nerve pain.

## 2017-09-24 NOTE — Progress Notes (Signed)
Post-Op Visit Note   Patient: Angelica French           Date of Birth: 1940/11/22           MRN: [id number] Visit Date: 09/24/2017 PCP: Lauraine Rinne, MD   Assessment & Plan:3 months post right knee  Chief Complaint:  Chief Complaint  Patient presents with  . Right Knee - Follow-up  Right knee 0-130 degrees Left knee 0-130 degrees Some aching right anterior distal thigh.  Visit Diagnoses: No diagnosis found.  Plan: Work on knee ROM and light strengthening. Ice or heat as needed.  Hydrocodone one every 6 hours prn pain this will be your last prescription for a narcotic. Gabapentin for nerve pain.  Follow-Up Instructions: No follow-ups on file.   Orders:  No orders of the defined types were placed in this encounter.  No orders of the defined types were placed in this encounter.   Imaging: No results found.  PMFS History: Patient Active Problem List   Diagnosis Date Noted  . Postoperative urinary retention 06/28/2017    Priority: High    Class: Acute  . Urinary tract infection 06/28/2017    Priority: High    Class: Present on Admission  . Anemia due to blood loss 06/28/2017    Priority: High    Class: Acute  . Bilateral primary osteoarthritis of knee 04/13/2017    Priority: High    Class: Chronic  . S/P TKR (total knee replacement) using cement, right 06/25/2017  . Diabetes mellitus type 2 without retinopathy ([place]) 06/04/2017  . Status post left knee replacement 04/13/2017  . Dry eye syndrome of both lacrimal glands 05/25/2016  . Myopia with astigmatism and presbyopia, bilateral 05/25/2016  . Posterior vitreous detachment of both eyes 05/25/2016  . Refractive amblyopia of left eye 05/25/2016  . Diabetes mellitus without complication ([place]) 25/05/3974  . Depression 03/01/2016  . Hypokalemia 08/31/2015  . CKD (chronic kidney disease) stage 3, GFR 30-59 ml/min (HCC) 07/08/2015  . Type 2 diabetes mellitus with diabetic chronic kidney disease ([place])  06/29/2015  . Atrophy of right kidney 04/26/2015  . Benign hypertension with CKD (chronic kidney disease) stage III ([place]) 03/22/2015  . Mixed hyperlipidemia 03/22/2015  . Renal artery stenosis of unknown cause ([place]) 02/17/2015  . Right renal artery stenosis ([place]) 02/17/2015  . Hyperlipidemia 09/16/2014  . Type 2 diabetes mellitus ([place]) 09/16/2014   Past Medical History:  Diagnosis Date  . Arthritis   . Bleeding ulcer   . CHF (congestive heart failure) ([place])   . Chronic kidney disease    Renal artery stenosis - right. Left kindey ok- sees Dr Neta Ehlers- nepjrologist  . Diabetes mellitus without complication ([place])    type II  . Gastric ulcer 1994   bleeding  . History of blood transfusion    1970- illeostomy 2004-  . Hypertension   . UC (ulcerative colitis) ([place])   . VRE (vancomycin resistant enterococcus) culture positive 2004    Family History  Problem Relation Age of Onset  . Heart disease Father     Past Surgical History:  Procedure Laterality Date  . ABDOMINAL HYSTERECTOMY    . ABSCESS DRAINAGE     abdominal  . BACK SURGERY Left 1997   Left 5 DISECTOMY  . BLADDER SUSPENSION  1998  . EYE SURGERY Bilateral    cataract  . HERNIA MESH REMOVAL  2004   relocation of ileostomy to opposite side of abdomen  . PERMANENT ILEOSTOMY  1970  .  stoma hernia  2004   attempted- resulting in perforated bowel and infection  . stoma; Hernia repair     2003- attempted  . TOTAL KNEE ARTHROPLASTY Left 04/13/2017   Procedure: LEFT TOTAL KNEE ARTHROPLASTY AND RIGHT KNEE INTRA-ARTICULAR STEROID INJECTION;  Surgeon: Jessy Oto, MD;  Location: [facility];  Service: Orthopedics;  Laterality: Left;  . TOTAL KNEE ARTHROPLASTY Right 06/25/2017  . TOTAL KNEE ARTHROPLASTY Right 06/25/2017   Procedure: RIGHT TOTAL KNEE ARTHROPLASTY;  Surgeon: Jessy Oto, MD;  Location: [facility];  Service: Orthopedics;  Laterality: Right;   Social History   Occupational History  . Not on file  Tobacco Use  . Smoking  status: Never Smoker  . Smokeless tobacco: Never Used  Substance and Sexual Activity  . Alcohol use: Yes    Comment: occasional drink with liqueor  . Drug use: Never  . Sexual activity: Not on file

## 2017-10-09 ENCOUNTER — Encounter: Payer: Self-pay | Admitting: Gastroenterology

## 2017-11-02 ENCOUNTER — Encounter: Payer: Self-pay | Admitting: Gastroenterology

## 2017-11-09 ENCOUNTER — Other Ambulatory Visit (INDEPENDENT_AMBULATORY_CARE_PROVIDER_SITE_OTHER): Payer: Medicare Other

## 2017-11-09 ENCOUNTER — Encounter: Payer: Self-pay | Admitting: Gastroenterology

## 2017-11-09 ENCOUNTER — Ambulatory Visit (INDEPENDENT_AMBULATORY_CARE_PROVIDER_SITE_OTHER): Payer: Medicare Other | Admitting: Gastroenterology

## 2017-11-09 VITALS — BP 110/72 | HR 64 | Ht 60.0 in | Wt 124.4 lb

## 2017-11-09 DIAGNOSIS — R634 Abnormal weight loss: Secondary | ICD-10-CM | POA: Diagnosis not present

## 2017-11-09 DIAGNOSIS — K439 Ventral hernia without obstruction or gangrene: Secondary | ICD-10-CM

## 2017-11-09 DIAGNOSIS — Z932 Ileostomy status: Secondary | ICD-10-CM

## 2017-11-09 DIAGNOSIS — R112 Nausea with vomiting, unspecified: Secondary | ICD-10-CM

## 2017-11-09 DIAGNOSIS — R1033 Periumbilical pain: Secondary | ICD-10-CM

## 2017-11-09 LAB — CREATININE, SERUM: Creatinine, Ser: 1.4 mg/dL — ABNORMAL HIGH (ref 0.40–1.20)

## 2017-11-09 LAB — BUN: BUN: 40 mg/dL — ABNORMAL HIGH (ref 6–23)

## 2017-11-09 NOTE — Progress Notes (Signed)
History of Present Illness: This is a 77 year old female referred by Thornton Dales I, MD for the evaluation of intermittent abdominal pain, intermittent nausea and vomiting and a 30 pound weight loss over the past year.  She relates that frequently following meals she will develop midline abdominal pain that lasts for 1 to 3 hours and occasionally this is associated with nausea and vomiting.  She has been intentionally eating smaller meals and less frequently to reduce symptoms.   She has a complicated past surgical history including total colectomy and permanent ileostomy for ulcerative colitis in 1970, attempted stomal hernia repair in 2003, attempted stomal hernia repair in 2004 that resulted in a bowel perforation and intra-abdominal infection, additional surgeries in 2004 to remove mesh and infection, relocate ileostomy to the left abdomen and a subsequent surgery in 2004 for an abdominal abscess.  She relates a worsening bulging in her left abdomen adjacent to her ileostomy and in addition notes bulging along her right abdomen and periumbilical area.   CT scan of the abdomen/pelvis performed on August 24, 2017 showed status post left colectomy with left lower quadrant ileostomy, large left lower quadrant hernia at the ileostomy site that includes multiple loops of small bowel with no apparent obstruction or complicating feature, atrophic right kidney, atherosclerosis, mild cardiomegaly.  Recent blood work unremarkable except for mild normocytic anemia, hemoglobin 11.2. Denies constipation, diarrhea, change in stool caliber, melena, hematochezia, dysphagia, reflux symptoms, chest pain.    Allergies  Allergen Reactions  . Adhesive [Tape] Hives and Itching  . Nsaids Nausea Only and Other (See Comments)    Gi pain after prolonged use  ULCER BUFFERED ASA ALSO INCLUDED   Outpatient Medications Prior to Visit  Medication Sig Dispense Refill  . atorvastatin (LIPITOR) 20 MG tablet Take 20 mg  by mouth. On Monday, Wednesday and Friday    . Biotin 2500 MCG CAPS Take 2,500 mcg by mouth daily.     . carvedilol (COREG) 25 MG tablet Take 25 mg by mouth at bedtime.     . cholecalciferol (VITAMIN D) 1000 units tablet Take 1,000 Units by mouth every other day.     . ciprofloxacin (CIPRO) 500 MG tablet Take 500 mg by mouth 2 (two) times daily.    . clindamycin (CLEOCIN) 2 % vaginal cream Place 1 Applicatorful vaginally daily as needed (irritation).     . Desoximetasone 0.05 % GEL Apply 1 application topically daily as needed (ostomy pouch irritation). 5 Tube 0  . furosemide (LASIX) 40 MG tablet Take 40 mg by mouth daily as needed for fluid or edema.    . gabapentin (NEURONTIN) 300 MG capsule Take 2 capsules (600 mg total) by mouth 3 (three) times daily. 180 capsule 3  . glimepiride (AMARYL) 4 MG tablet Take 1 tablet (4 mg total) by mouth 2 times daily at 12 noon and 4 pm. 60 tablet 3  . hydrALAZINE (APRESOLINE) 50 MG tablet Take 50 mg by mouth twice daily    . hydrochlorothiazide (HYDRODIURIL) 25 MG tablet Take 25 mg by mouth every morning.   1  . HYDROcodone-acetaminophen (NORCO) 7.5-325 MG tablet Take 1 tablet by mouth every 6 (six) hours as needed for severe pain (pain score 7-10). 30 tablet 0  . liraglutide (VICTOZA) 18 MG/3ML SOPN Inject 0.6 mg once daily    . losartan (COZAAR) 50 MG tablet Take 50 mg by mouth at bedtime.     . potassium bicarbonate (K-LYTE) 25 MEQ disintegrating tablet Take 25 mEq by  mouth as needed.    Marland Kitchen dextromethorphan-guaiFENesin (MUCINEX DM) 30-600 MG 12hr tablet Take 1 tablet by mouth 2 (two) times daily. 60 tablet 1  . acetaminophen (TYLENOL) 325 MG tablet Take 1-2 tablets (325-650 mg total) by mouth every 6 (six) hours as needed for mild pain (pain score 1-3 or temp > 100.5). 40 tablet 1  . aspirin EC 325 MG EC tablet Take 1 tablet (325 mg total) by mouth daily with breakfast. 30 tablet 0  . diphenhydrAMINE (BENADRYL) 12.5 MG/5ML elixir Take 5-10 mLs (12.5-25 mg  total) by mouth every 4 (four) hours as needed for itching. 120 mL 0  . docusate sodium (COLACE) 100 MG capsule Take 1 capsule (100 mg total) by mouth 2 (two) times daily. 10 capsule 0  . loratadine (CLARITIN) 10 MG tablet Take 1 tablet (10 mg total) by mouth daily. 30 tablet 1  . magic mouthwash SOLN Take 5 mLs by mouth 4 (four) times daily as needed for mouth pain (thrush). 100 mL 0  . magic mouthwash SOLN Take 5 mLs by mouth 4 (four) times daily as needed for mouth pain (thrush). 50 mL 0  . Multiple Vitamins-Iron (MULTIVITAMINS WITH IRON) TABS tablet Take 1 tablet by mouth daily. 30 tablet 0  . potassium bicarbonate (K-LYTE) 25 MEQ disintegrating tablet Take 25 mEq by mouth daily as needed (swelling). With lasix     No facility-administered medications prior to visit.    Past Medical History:  Diagnosis Date  . Arthritis   . Bleeding ulcer   . CHF (congestive heart failure) ([place])   . Chronic kidney disease    Renal artery stenosis - right. Left kindey ok- sees Dr Neta Ehlers- nepjrologist  . Diabetes mellitus without complication ([place])    type II  . Gastric ulcer 1994   bleeding  . History of blood transfusion    1970- illeostomy 2004-  . Hypertension   . UC (ulcerative colitis) ([place])   . VRE (vancomycin resistant enterococcus) culture positive 2004   Past Surgical History:  Procedure Laterality Date  . ABDOMINAL HYSTERECTOMY    . ABSCESS DRAINAGE     abdominal  . BACK SURGERY Left 1997   Left 5 DISECTOMY  . BLADDER SUSPENSION  1998  . EYE SURGERY Bilateral    cataract  . HERNIA MESH REMOVAL  2004   relocation of ileostomy to opposite side of abdomen  . PERMANENT ILEOSTOMY  1970  . stoma hernia  2004   attempted- resulting in perforated bowel and infection  . stoma; Hernia repair     2003- attempted  . TOTAL KNEE ARTHROPLASTY Left 04/13/2017   Procedure: LEFT TOTAL KNEE ARTHROPLASTY AND RIGHT KNEE INTRA-ARTICULAR STEROID INJECTION;  Surgeon: Jessy Oto, MD;  Location: [facility];  Service: Orthopedics;  Laterality: Left;  . TOTAL KNEE ARTHROPLASTY Right 06/25/2017  . TOTAL KNEE ARTHROPLASTY Right 06/25/2017   Procedure: RIGHT TOTAL KNEE ARTHROPLASTY;  Surgeon: Jessy Oto, MD;  Location: [facility];  Service: Orthopedics;  Laterality: Right;   Social History   Socioeconomic History  . Marital status: Married    Spouse name: Not on file  . Number of children: Not on file  . Years of education: Not on file  . Highest education level: Not on file  Occupational History  . Not on file  Social Needs  . Financial resource strain: Not on file  . Food insecurity:    Worry: Not on file    Inability: Not on file  .  Transportation needs:    Medical: Not on file    Non-medical: Not on file  Tobacco Use  . Smoking status: Never Smoker  . Smokeless tobacco: Never Used  Substance and Sexual Activity  . Alcohol use: Yes    Comment: occasional drink with liquor  . Drug use: Never  . Sexual activity: Not on file  Lifestyle  . Physical activity:    Days per week: Not on file    Minutes per session: Not on file  . Stress: Not on file  Relationships  . Social connections:    Talks on phone: Not on file    Gets together: Not on file    Attends religious service: Not on file    Active member of club or organization: Not on file    Attends meetings of clubs or organizations: Not on file    Relationship status: Not on file  Other Topics Concern  . Not on file  Social History Narrative  . Not on file   Family History  Problem Relation Age of Onset  . Heart disease Father        Review of Systems: Pertinent positive and negative review of systems were noted in the above HPI section. All other review of systems were otherwise negative.    Physical Exam: General: Well developed, well nourished, no acute distress Head: Normocephalic and atraumatic Eyes:  sclerae anicteric, EOMI Ears: Normal auditory acuity Mouth: No deformity or lesions Neck: Supple, no  masses or thyromegaly Lungs: Clear throughout to auscultation Heart: Regular rate and rhythm; no murmurs, rubs or bruits Abdomen: Soft and non distended. No masses, hepatosplenomegaly noted. Multiple healed incisions. Decreased abdominal muscle tone. LLQ ileostomy with large parastomal hernia, possible abdominal wall defects adjacent to the umbilicus and adjacent to her right abdomen ileostomy closure incision, normal bowel sounds. Mild tenderness at umbilicus.  Musculoskeletal: Symmetrical with no gross deformities  Skin: No lesions on visible extremities Pulses:  Normal pulses noted Extremities: No clubbing, cyanosis, edema or deformities noted Neurological: Alert oriented x 4, grossly nonfocal Cervical Nodes:  No significant cervical adenopathy Inguinal Nodes: No significant inguinal adenopathy Psychological:  Alert and cooperative. Normal mood and affect   Assessment and Recommendations:  1.  Intermittent abdominal pain, intermittent nausea/vomiting, progressive weight loss over past year.  Suspected intermittent partial bowel obstruction related to parastomal hernia or adhesions. Rule out mesenteric insufficiency which is unlikely given no evidence of mesenteric vascular disease on recent CT.  Rule out ulcer, GOO.  Small, frequent low-fat low fiber meals. Schedule CT enterography and EGD. The risks (including bleeding, perforation, infection, missed lesions, medication reactions and possible hospitalization or surgery if complications occur), benefits, and alternatives to endoscopy with possible biopsy and possible dilation were discussed with the patient and they consent to proceed.  Proceed with surgical referral following above evaluation.  2.  History of ulcerative colitis status post total colectomy and ileostomy.  3.  History of bleeding gastric ulcer in 1994.  4.  DM.   5. CKD.     cc: Lauraine Rinne, MD [id number] [place], [place] [address]

## 2017-11-09 NOTE — Patient Instructions (Addendum)
Your provider has requested that you go to the basement level for lab work before leaving today. Press "B" on the elevator. The lab is located at the first door on the left as you exit the elevator.   You have been scheduled for a CT enterography scan of the abdomen and pelvis at [facility] ([address]---this is in the same building as Press [facility]).   You are scheduled on 11/15/17 at 10:45am. You should arrive at 9:15am Please follow the written instructions below on the day of your exam:  WARNING: IF YOU ARE ALLERGIC TO IODINE/X-RAY DYE, PLEASE NOTIFY RADIOLOGY IMMEDIATELY AT [phone]! YOU WILL BE GIVEN A 13 HOUR PREMEDICATION PREP.  1) Do not eat anything after 6:45am (4 hours prior to your test)   If you take any of the following medications: METFORMIN, GLUCOPHAGE, GLUCOVANCE, AVANDAMET, RIOMET, FORTAMET, Dana MET, JANUMET, GLUMETZA or METAGLIP, you MAY be asked to HOLD this medication 48 hours AFTER the exam.  The purpose of you drinking the oral contrast is to aid in the visualization of your intestinal tract. The contrast solution may cause some diarrhea. Depending on your individual set of symptoms, you may also receive an intravenous injection of x-ray contrast/dye. Plan on being at [facility] for 30 minutes or longer, depending on the type of exam you are having performed.  If you have any questions regarding your exam or if you need to reschedule, you may call the CT department at [phone] between the hours of 8:00 am and 5:00 pm, Monday-Friday.  ________________________________________________________________________  Angelica French have been scheduled for an endoscopy. Please follow written instructions given to you at your visit today. If you use inhalers (even only as needed), please bring them with you on the day of your procedure. Your physician has requested that you go to www.startemmi.com and enter the access code given to you at your visit  today. This web site gives a general overview about your procedure. However, you should still follow specific instructions given to you by our office regarding your preparation for the procedure.   Thank you for choosing me and [facility].  Pricilla Riffle. Dagoberto Ligas., MD., [facility]

## 2017-11-12 ENCOUNTER — Ambulatory Visit (INDEPENDENT_AMBULATORY_CARE_PROVIDER_SITE_OTHER): Payer: Medicare Other | Admitting: Specialist

## 2017-11-12 ENCOUNTER — Encounter (INDEPENDENT_AMBULATORY_CARE_PROVIDER_SITE_OTHER): Payer: Self-pay | Admitting: Specialist

## 2017-11-12 VITALS — BP 126/65 | HR 66 | Ht 60.0 in | Wt 122.0 lb

## 2017-11-12 DIAGNOSIS — Z96653 Presence of artificial knee joint, bilateral: Secondary | ICD-10-CM

## 2017-11-12 MED ORDER — AMOXICILLIN-POT CLAVULANATE 500-125 MG PO TABS: ORAL_TABLET | ORAL | 0 refills | Status: DC

## 2017-11-12 NOTE — Patient Instructions (Addendum)
Bilateral knee replacements. Be careful with stair use rails, and be careful with ladders and stepping stools. Well padded shoes help. Ice the knee 2-3 times a day 15-20 mins at a time. Fall Prevention and Home Safety Falls cause injuries and can affect all age groups. It is possible to use preventive measures to significantly decrease the likelihood of falls. There are many simple measures which can make your home safer and prevent falls. OUTDOORS  Repair cracks and edges of walkways and driveways.  Remove high doorway thresholds.  Trim shrubbery on the main path into your home.  Have good outside lighting.  Clear walkways of tools, rocks, debris, and clutter.  Check that handrails are not broken and are securely fastened. Both sides of steps should have handrails.  Have leaves, snow, and ice cleared regularly.  Use sand or salt on walkways during winter months.  In the garage, clean up grease or oil spills. BATHROOM  Install night lights.  Install grab bars by the toilet and in the tub and shower.  Use non-skid mats or decals in the tub or shower.  Place a plastic non-slip stool in the shower to sit on, if needed.  Keep floors dry and clean up all water on the floor immediately.  Remove soap buildup in the tub or shower on a regular basis.  Secure bath mats with non-slip, double-sided rug tape.  Remove throw rugs and tripping hazards from the floors. BEDROOMS  Install night lights.  Make sure a bedside light is easy to reach.  Do not use oversized bedding.  Keep a telephone by your bedside.  Have a firm chair with side arms to use for getting dressed.  Remove throw rugs and tripping hazards from the floor. KITCHEN  Keep handles on pots and pans turned toward the center of the stove. Use back burners when possible.  Clean up spills quickly and allow time for drying.  Avoid walking on wet floors.  Avoid hot utensils and knives.  Position shelves so  they are not too high or low.  Place commonly used objects within easy reach.  If necessary, use a sturdy step stool with a grab bar when reaching.  Keep electrical cables out of the way.  Do not use floor polish or wax that makes floors slippery. If you must use wax, use non-skid floor wax.  Remove throw rugs and tripping hazards from the floor. STAIRWAYS  Never leave objects on stairs.  Place handrails on both sides of stairways and use them. Fix any loose handrails. Make sure handrails on both sides of the stairways are as long as the stairs.  Check carpeting to make sure it is firmly attached along stairs. Make repairs to worn or loose carpet promptly.  Avoid placing throw rugs at the top or bottom of stairways, or properly secure the rug with carpet tape to prevent slippage. Get rid of throw rugs, if possible.  Have an electrician put in a light switch at the top and bottom of the stairs. OTHER FALL PREVENTION TIPS  Wear low-heel or rubber-soled shoes that are supportive and fit well. Wear closed toe shoes.  When using a stepladder, make sure it is fully opened and both spreaders are firmly locked. Do not climb a closed stepladder.  Add color or contrast paint or tape to grab bars and handrails in your home. Place contrasting color strips on first and last steps.  Learn and use mobility aids as needed. Install an electrical emergency response system.  Turn on lights to avoid dark areas. Replace light bulbs that burn out immediately. Get light switches that glow.  Arrange furniture to create clear pathways. Keep furniture in the same place.  Firmly attach carpet with non-skid or double-sided tape.  Eliminate uneven floor surfaces.  Select a carpet pattern that does not visually hide the edge of steps.  Be aware of all pets. OTHER HOME SAFETY TIPS  Set the water temperature for 120 F (48.8 C).  Keep emergency numbers on or near the telephone.  Keep smoke  detectors on every level of the home and near sleeping areas. Document Released: 12/09/2001 Document Revised: 06/20/2011 Document Reviewed: 03/10/2011 [facility] Patient Information 2014 [place].

## 2017-11-12 NOTE — Progress Notes (Signed)
Office Visit Note   Patient: Angelica French           Date of Birth: [birth year]           MRN: [id number] Visit Date: 11/12/2017              Requested by: Lauraine Rinne, MD [place], [place] [address] PCP: Thornton Dales I, MD   Assessment & Plan: Visit Diagnoses:  1. History of total bilateral knee replacement (TKR)     Plan:  Bilateral knee replacements. Be careful with stair use rails, and be careful with ladders and stepping stools. Well padded shoes help. Ice the knee 2-3 times a day 15-20 mins at a time. Fall Prevention and Home Safety Falls cause injuries and can affect all age groups. It is possible to use preventive measures to significantly decrease the likelihood of falls. There are many simple measures which can make your home safer and prevent falls. OUTDOORS  Repair cracks and edges of walkways and driveways.  Remove high doorway thresholds.  Trim shrubbery on the main path into your home.  Have good outside lighting.  Clear walkways of tools, rocks, debris, and clutter.  Check that handrails are not broken and are securely fastened. Both sides of steps should have handrails.  Have leaves, snow, and ice cleared regularly.  Use sand or salt on walkways during winter months.  In the garage, clean up grease or oil spills. BATHROOM  Install night lights.  Install grab bars by the toilet and in the tub and shower.  Use non-skid mats or decals in the tub or shower.  Place a plastic non-slip stool in the shower to sit on, if needed.  Keep floors dry and clean up all water on the floor immediately.  Remove soap buildup in the tub or shower on a regular basis.  Secure bath mats with non-slip, double-sided rug tape.  Remove throw rugs and tripping hazards from the floors. BEDROOMS  Install night lights.  Make sure a bedside light is easy to reach.  Do not use oversized bedding.  Keep a telephone by your bedside.  Have a  firm chair with side arms to use for getting dressed.  Remove throw rugs and tripping hazards from the floor. KITCHEN  Keep handles on pots and pans turned toward the center of the stove. Use back burners when possible.  Clean up spills quickly and allow time for drying.  Avoid walking on wet floors.  Avoid hot utensils and knives.  Position shelves so they are not too high or low.  Place commonly used objects within easy reach.  If necessary, use a sturdy step stool with a grab bar when reaching.  Keep electrical cables out of the way.  Do not use floor polish or wax that makes floors slippery. If you must use wax, use non-skid floor wax.  Remove throw rugs and tripping hazards from the floor. STAIRWAYS  Never leave objects on stairs.  Place handrails on both sides of stairways and use them. Fix any loose handrails. Make sure handrails on both sides of the stairways are as long as the stairs.  Check carpeting to make sure it is firmly attached along stairs. Make repairs to worn or loose carpet promptly.  Avoid placing throw rugs at the top or bottom of stairways, or properly secure the rug with carpet tape to prevent slippage. Get rid of throw rugs, if possible.  Have an electrician put in a  light switch at the top and bottom of the stairs. OTHER FALL PREVENTION TIPS  Wear low-heel or rubber-soled shoes that are supportive and fit well. Wear closed toe shoes.  When using a stepladder, make sure it is fully opened and both spreaders are firmly locked. Do not climb a closed stepladder.  Add color or contrast paint or tape to grab bars and handrails in your home. Place contrasting color strips on first and last steps.  Learn and use mobility aids as needed. Install an electrical emergency response system.  Turn on lights to avoid dark areas. Replace light bulbs that burn out immediately. Get light switches that glow.  Arrange furniture to create clear pathways. Keep  furniture in the same place.  Firmly attach carpet with non-skid or double-sided tape.  Eliminate uneven floor surfaces.  Select a carpet pattern that does not visually hide the edge of steps.  Be aware of all pets. OTHER HOME SAFETY TIPS  Set the water temperature for 120 F (48.8 C).  Keep emergency numbers on or near the telephone.  Keep smoke detectors on every level of the home and near sleeping areas. Document Released: 12/09/2001 Document Revised: 06/20/2011 Document Reviewed: 03/10/2011 [facility] Patient Information 2014 [place].  Follow-Up Instructions: Return in about 1 year (around 11/13/2018).   Orders:  No orders of the defined types were placed in this encounter.  Meds ordered this encounter  Medications  . amoxicillin-clavulanate (AUGMENTIN) 500-125 MG tablet    Sig: Take one tablet one hour prior to dental procedure then one tablet po 8 hours    Dispense:  4 tablet    Refill:  0      Procedures: No procedures performed   Clinical Data: No additional findings.   Subjective: Chief Complaint  Patient presents with  . Right Knee - Follow-up  . Left Knee - Follow-up    HPI  Review of Systems  Constitutional: Negative.   HENT: Negative.   Eyes: Negative.   Respiratory: Negative.   Cardiovascular: Negative.   Gastrointestinal: Negative.   Endocrine: Negative.   Genitourinary: Negative.   Musculoskeletal: Negative.   Skin: Negative.   Allergic/Immunologic: Negative.   Neurological: Negative.   Hematological: Negative.   Psychiatric/Behavioral: Negative.      Objective: Vital Signs: BP 126/65 (BP Location: Left Arm, Patient Position: Sitting)   Pulse 66   Ht 5' (1.524 m)   Wt 122 lb (55.3 kg)   BMI 23.83 kg/m   Physical Exam  Constitutional: She is oriented to person, place, and time. She appears well-developed and well-nourished.  HENT:  Head: Normocephalic and atraumatic.  Eyes: Pupils are equal, round, and reactive to  light. EOM are normal.  Neck: Normal range of motion. Neck supple.  Pulmonary/Chest: Effort normal and breath sounds normal.  Abdominal: Soft. Bowel sounds are normal.  Musculoskeletal: Normal range of motion.  Neurological: She is alert and oriented to person, place, and time.  Skin: Skin is warm and dry.  Psychiatric: She has a normal mood and affect. Her behavior is normal. Judgment and thought content normal.    Right Knee Exam   Muscle Strength  The patient has normal right knee strength.  Range of Motion  Extension: 0  Flexion: 130   Tests  McMurray:  Medial - negative Lateral - negative Varus: negative Valgus: negative Lachman:  Anterior - negative    Posterior - negative Drawer:  Anterior - negative    Posterior - negative Pivot shift: negative Patellar apprehension: negative  Other  Erythema: absent Scars: present Sensation: normal Pulse: present Swelling: none  Comments:  Incision is healed no effusion She is pleased with the results of her surgery.   Left Knee Exam   Muscle Strength  The patient has normal left knee strength.  Range of Motion  Extension: 0  Flexion: 130   Tests  McMurray:  Medial - negative Lateral - negative Varus: negative  Lachman:  Anterior - negative    Posterior - negative Drawer:  Anterior - negative     Posterior - negative Pivot shift: negative Patellar apprehension: negative  Other  Scars: present Sensation: normal Pulse: present Swelling: none  Comments:  Incision is healed no effusion      Specialty Comments:  No specialty comments available.  Imaging: No results found.   PMFS History: Patient Active Problem List   Diagnosis Date Noted  . Postoperative urinary retention 06/28/2017    Priority: High    Class: Acute  . Urinary tract infection 06/28/2017    Priority: High    Class: Present on Admission  . Anemia due to blood loss 06/28/2017    Priority: High    Class: Acute  . Bilateral primary  osteoarthritis of knee 04/13/2017    Priority: High    Class: Chronic  . S/P TKR (total knee replacement) using cement, right 06/25/2017  . Diabetes mellitus type 2 without retinopathy ([place]) 06/04/2017  . Status post left knee replacement 04/13/2017  . Dry eye syndrome of both lacrimal glands 05/25/2016  . Myopia with astigmatism and presbyopia, bilateral 05/25/2016  . Posterior vitreous detachment of both eyes 05/25/2016  . Refractive amblyopia of left eye 05/25/2016  . Diabetes mellitus without complication ([place]) 16/10/9602  . Depression 03/01/2016  . Hypokalemia 08/31/2015  . CKD (chronic kidney disease) stage 3, GFR 30-59 ml/min ([facility]) 07/08/2015  . Type 2 diabetes mellitus with diabetic chronic kidney disease ([place]) 06/29/2015  . Atrophy of right kidney 04/26/2015  . Benign hypertension with CKD (chronic kidney disease) stage III ([place]) 03/22/2015  . Mixed hyperlipidemia 03/22/2015  . Renal artery stenosis of unknown cause ([place]) 02/17/2015  . Right renal artery stenosis ([place]) 02/17/2015  . Hyperlipidemia 09/16/2014  . Type 2 diabetes mellitus ([place]) 09/16/2014   Past Medical History:  Diagnosis Date  . Arthritis   . Bleeding ulcer   . CHF (congestive heart failure) ([place])   . Chronic kidney disease    Renal artery stenosis - right. Left kindey ok- sees Dr Neta Ehlers- nepjrologist  . Diabetes mellitus without complication ([place])    type II  . Gastric ulcer 1994   bleeding  . History of blood transfusion    1970- illeostomy 2004-  . Hypertension   . UC (ulcerative colitis) ([place])   . VRE (vancomycin resistant enterococcus) culture positive 2004    Family History  Problem Relation Age of Onset  . Heart disease Father     Past Surgical History:  Procedure Laterality Date  . ABDOMINAL HYSTERECTOMY    . ABSCESS DRAINAGE     abdominal  . BACK SURGERY Left 1997   Left 5 DISECTOMY  . BLADDER SUSPENSION  1998  . EYE SURGERY Bilateral    cataract  . HERNIA MESH REMOVAL  2004    relocation of ileostomy to opposite side of abdomen  . PERMANENT ILEOSTOMY  1970  . stoma hernia  2004   attempted- resulting in perforated bowel and infection  . stoma; Hernia repair     2003- attempted  . TOTAL  KNEE ARTHROPLASTY Left 04/13/2017   Procedure: LEFT TOTAL KNEE ARTHROPLASTY AND RIGHT KNEE INTRA-ARTICULAR STEROID INJECTION;  Surgeon: Jessy Oto, MD;  Location: [facility];  Service: Orthopedics;  Laterality: Left;  . TOTAL KNEE ARTHROPLASTY Right 06/25/2017  . TOTAL KNEE ARTHROPLASTY Right 06/25/2017   Procedure: RIGHT TOTAL KNEE ARTHROPLASTY;  Surgeon: Jessy Oto, MD;  Location: [facility];  Service: Orthopedics;  Laterality: Right;   Social History   Occupational History  . Not on file  Tobacco Use  . Smoking status: Never Smoker  . Smokeless tobacco: Never Used  Substance and Sexual Activity  . Alcohol use: Yes    Comment: occasional drink with liquor  . Drug use: Never  . Sexual activity: Not on file

## 2017-11-15 ENCOUNTER — Ambulatory Visit (INDEPENDENT_AMBULATORY_CARE_PROVIDER_SITE_OTHER)
Admission: RE | Admit: 2017-11-15 | Discharge: 2017-11-15 | Disposition: A | Discharge status: Home / Self Care | Payer: Medicare Other | Source: Ambulatory Visit | Attending: Gastroenterology | Admitting: Gastroenterology

## 2017-11-15 DIAGNOSIS — K439 Ventral hernia without obstruction or gangrene: Secondary | ICD-10-CM | POA: Diagnosis not present

## 2017-11-15 DIAGNOSIS — Z932 Ileostomy status: Secondary | ICD-10-CM

## 2017-11-15 DIAGNOSIS — R634 Abnormal weight loss: Secondary | ICD-10-CM | POA: Diagnosis not present

## 2017-11-15 DIAGNOSIS — R112 Nausea with vomiting, unspecified: Secondary | ICD-10-CM

## 2017-11-15 MED ORDER — IOPAMIDOL (ISOVUE-300) INJECTION 61%
80.0000 mL | Freq: Once | INTRAVENOUS | Status: AC | PRN
  Administered 2017-11-15: 80 mL via INTRAVENOUS

## 2017-12-04 DIAGNOSIS — H18529 Epithelial (juvenile) corneal dystrophy, unspecified eye: Secondary | ICD-10-CM | POA: Insufficient documentation

## 2017-12-19 ENCOUNTER — Ambulatory Visit (AMBULATORY_SURGERY_CENTER): Payer: Medicare Other | Admitting: Gastroenterology

## 2017-12-19 ENCOUNTER — Encounter: Payer: Self-pay | Admitting: Gastroenterology

## 2017-12-19 VITALS — BP 120/66 | HR 60 | Temp 97.5°F | Resp 14 | Ht 60.0 in | Wt 122.0 lb

## 2017-12-19 DIAGNOSIS — K2960 Other gastritis without bleeding: Secondary | ICD-10-CM

## 2017-12-19 DIAGNOSIS — K2950 Unspecified chronic gastritis without bleeding: Secondary | ICD-10-CM | POA: Diagnosis not present

## 2017-12-19 DIAGNOSIS — R634 Abnormal weight loss: Secondary | ICD-10-CM

## 2017-12-19 DIAGNOSIS — K635 Polyp of colon: Secondary | ICD-10-CM | POA: Diagnosis not present

## 2017-12-19 DIAGNOSIS — K2980 Duodenitis without bleeding: Secondary | ICD-10-CM

## 2017-12-19 DIAGNOSIS — K2970 Gastritis, unspecified, without bleeding: Secondary | ICD-10-CM

## 2017-12-19 DIAGNOSIS — R1084 Generalized abdominal pain: Secondary | ICD-10-CM

## 2017-12-19 DIAGNOSIS — K263 Acute duodenal ulcer without hemorrhage or perforation: Secondary | ICD-10-CM | POA: Diagnosis not present

## 2017-12-19 DIAGNOSIS — R112 Nausea with vomiting, unspecified: Secondary | ICD-10-CM

## 2017-12-19 DIAGNOSIS — K3189 Other diseases of stomach and duodenum: Secondary | ICD-10-CM

## 2017-12-19 DIAGNOSIS — K317 Polyp of stomach and duodenum: Secondary | ICD-10-CM

## 2017-12-19 MED ORDER — SODIUM CHLORIDE 0.9 % IV SOLN: 500.0000 mL | Freq: Once | INTRAVENOUS | Status: DC

## 2017-12-19 MED ORDER — OMEPRAZOLE 40 MG PO CPDR: 40.0000 mg | DELAYED_RELEASE_CAPSULE | Freq: Every day | ORAL | 3 refills | Status: DC

## 2017-12-19 MED ORDER — DEXTROSE 50 % IV SOLN
25.0000 mL | Freq: Once | INTRAVENOUS | Status: AC
  Administered 2017-12-19: 25 mL via INTRAVENOUS

## 2017-12-19 NOTE — Op Note (Signed)
[facility] Patient Name: Angelica French Procedure Date: 12/19/2017 3:27 PM MRN: [id number] Endoscopist: Ladene Artist , MD Age: 77 Referring MD:  Date of Birth: [birth year] Gender: Female Account #: [id number] Procedure:                Upper GI endoscopy Indications:              Generalized abdominal pain, Nausea with vomiting,                            Weight loss Medicines:                Monitored Anesthesia Care Procedure:                Pre-Anesthesia Assessment:                           - Prior to the procedure, a History and Physical                            was performed, and patient medications and                            allergies were reviewed. The patient's tolerance of                            previous anesthesia was also reviewed. The risks                            and benefits of the procedure and the sedation                            options and risks were discussed with the patient.                            All questions were answered, and informed consent                            was obtained. Prior Anticoagulants: The patient has                            taken no previous anticoagulant or antiplatelet                            agents. ASA Grade Assessment: II - A patient with                            mild systemic disease. After reviewing the risks                            and benefits, the patient was deemed in                            satisfactory condition to undergo the procedure.  After obtaining informed consent, the endoscope was                            passed under direct vision. Throughout the                            procedure, the patient's blood pressure, pulse, and                            oxygen saturations were monitored continuously. The                            Endoscope was introduced through the mouth, and                            advanced to the second part of duodenum.  The upper                            GI endoscopy was accomplished without difficulty.                            The patient tolerated the procedure well. Scope In: Scope Out: Findings:                 The examined esophagus was normal.                           Multiple dispersed, small non-bleeding erosions                            were found in the gastric body and in the gastric                            antrum. There were no stigmata of recent bleeding.                            Biopsies were taken with a cold forceps for                            histology.                           A few 5 to 12 mm sessile polyps with no bleeding                            and no stigmata of recent bleeding were found in                            the gastric fundus. Biopsies were taken with a cold                            forceps for histology.  The exam of the stomach was otherwise normal.                           A single localized erosion without bleeding was                            found in the duodenal bulb.                           The exam of the duodenum was otherwise normal. Complications:            No immediate complications. Estimated Blood Loss:     Estimated blood loss was minimal. Impression:               - Normal esophagus.                           - Non-bleeding erosive gastropathy. Biopsied.                           - A few gastric polyps. Biopsied.                           - Duodenal erosion without bleeding. Recommendation:           - Patient has a contact number available for                            emergencies. The signs and symptoms of potential                            delayed complications were discussed with the                            patient. Return to normal activities tomorrow.                            Written discharge instructions were provided to the                            patient.                            - Resume previous diet.                           - Continue present medications.                           - Await pathology results.                           - Prilosec (omeprazole) 40 mg PO daily indefinitely.                           - Return to GI office in 6 weeks. Ladene Artist, MD 12/19/2017 3:52:42 PM This report has been signed electronically.

## 2017-12-19 NOTE — Progress Notes (Signed)
Called to room to assist during endoscopic procedure.  Patient ID and intended procedure confirmed with present staff. Received instructions for my participation in the procedure from the performing physician.  

## 2017-12-19 NOTE — Patient Instructions (Signed)
YOU HAD AN ENDOSCOPIC PROCEDURE TODAY AT [facility]:   Refer to the procedure report that was given to you for any specific questions about what was found during the examination.  If the procedure report does not answer your questions, please call your gastroenterologist to clarify.  If you requested that your care partner not be given the details of your procedure findings, then the procedure report has been included in a sealed envelope for you to review at your convenience later.  YOU SHOULD EXPECT: Some feelings of bloating in the abdomen. Passage of more gas than usual.  Walking can help get rid of the air that was put into your GI tract during the procedure and reduce the bloating.   Please Note:  You might notice some irritation and congestion in your nose or some drainage.  This is from the oxygen used during your procedure.  There is no need for concern and it should clear up in a day or so.  SYMPTOMS TO REPORT IMMEDIATELY:    Following upper endoscopy (EGD)  Vomiting of blood or coffee ground material  New chest pain or pain under the shoulder blades  Painful or persistently difficult swallowing  New shortness of breath  Fever of 100F or higher  Black, tarry-looking stools  For urgent or emergent issues, a gastroenterologist can be reached at any hour by calling [phone].   DIET:  We do recommend a small meal at first, but then you may proceed to your regular diet.  Drink plenty of fluids but you should avoid alcoholic beverages for 24 hours.  ACTIVITY:  You should plan to take it easy for the rest of today and you should NOT DRIVE or use heavy machinery until tomorrow (because of the sedation medicines used during the test).    FOLLOW UP: Our staff will call the number listed on your records the next business day following your procedure to check on you and address any questions or concerns that you may have regarding the information given to you  following your procedure. If we do not reach you, we will leave a message.  However, if you are feeling well and you are not experiencing any problems, there is no need to return our call.  We will assume that you have returned to your regular daily activities without incident.  If any biopsies were taken you will be contacted by phone or by letter within the next 1-3 weeks.  Please call us at [phone] if you have not heard about the biopsies in 3 weeks.    SIGNATURES/CONFIDENTIALITY: You and/or your care partner have signed paperwork which will be entered into your electronic medical record.  These signatures attest to the fact that that the information above on your After Visit Summary has been reviewed and is understood.  Full responsibility of the confidentiality of this discharge information lies with you and/or your care-partner.  Read all of the handouts given to you by your recovery room nurse.

## 2017-12-19 NOTE — Progress Notes (Signed)
Report given to PACU, vss 

## 2017-12-20 ENCOUNTER — Telehealth: Payer: Self-pay

## 2017-12-20 NOTE — Telephone Encounter (Signed)
  Follow up Call-  Call back number 12/19/2017  Post procedure Call Back phone  # [phone]  Permission to leave phone message Yes     Patient questions:  Do you have a fever, pain , or abdominal swelling? No. Pain Score  0 *  Have you tolerated food without any problems? Yes.    Have you been able to return to your normal activities? Yes.    Do you have any questions about your discharge instructions: Diet   No. Medications  No. Follow up visit  No.  Do you have questions or concerns about your Care? No.  Actions: * If pain score is 4 or above: No action needed, pain <4.

## 2018-01-03 ENCOUNTER — Encounter: Payer: Self-pay | Admitting: Gastroenterology

## 2018-02-01 ENCOUNTER — Encounter: Payer: Self-pay | Admitting: Gastroenterology

## 2018-02-01 ENCOUNTER — Ambulatory Visit (INDEPENDENT_AMBULATORY_CARE_PROVIDER_SITE_OTHER): Payer: Medicare Other | Admitting: Gastroenterology

## 2018-02-01 VITALS — BP 112/74 | HR 80 | Ht 60.0 in | Wt 129.4 lb

## 2018-02-01 DIAGNOSIS — K2980 Duodenitis without bleeding: Secondary | ICD-10-CM | POA: Diagnosis not present

## 2018-02-01 DIAGNOSIS — K2960 Other gastritis without bleeding: Secondary | ICD-10-CM | POA: Diagnosis not present

## 2018-02-01 DIAGNOSIS — K435 Parastomal hernia without obstruction or  gangrene: Secondary | ICD-10-CM

## 2018-02-01 MED ORDER — FAMOTIDINE 20 MG PO TABS: 20.0000 mg | ORAL_TABLET | Freq: Two times a day (BID) | ORAL | 11 refills | Status: DC

## 2018-02-01 NOTE — Patient Instructions (Addendum)
Increase your famotidine 20 mg to take one tablet by mouth twice daily. A new prescription has been sent to your pharmacy.   Call back in 2 weeks if your symptoms are not better.   Thank you for choosing me and [facility].  Pricilla Riffle. Dagoberto Ligas., MD., [facility]

## 2018-02-01 NOTE — Progress Notes (Signed)
    History of Present Illness: This is a 78 year old female returning for follow-up of abdominal pain, N/V.  She relates that omeprazole made her abdominal pain worse so she discontinued it.  She then took ranitidine 150 mg twice daily for several weeks and her pain completely resolved.  She returned for refill of ranitidine and it has been taken off the market.  Her pharmacist advised famotidine 20 mg daily which she states has not been as effective in controlling her symptoms.  EGD 12/2017:  - Normal esophagus. - Non-bleeding erosive gastropathy. Biopsied. - A few gastric polyps. Biopsied. - Duodenal erosion without bleeding.  Current Medications, Allergies, Past Medical History, Past Surgical History, Family History and Social History were reviewed in [employer] record.  Physical Exam: General: Well developed, well nourished, no acute distress Head: Normocephalic and atraumatic Eyes:  sclerae anicteric, EOMI Ears: Normal auditory acuity Neurological: Alert oriented x 4, grossly nonfocal Psychological:  Alert and cooperative. Normal mood and affect   Assessment and Recommendations:  1. Erosive gastritis.  Erosive duodenitis.  History of bleeding gastric ulcer in 1994.  Her symptoms improved substantially while taking ranitidine twice daily.  Increase famotidine to 20 mg p.o. twice daily.  Minimize or avoid aspirin and NSAID usage.  If symptoms not well controlled within 2 weeks will increase famotidine to 40 mg twice daily. REV in 6 weeks.   2.  Parastomal hernia.  I remain concerned she has intermittent obstructive symptoms as well.  3.  History of ulcerative colitis status post total colectomy and ileostomy.  4. DM.  5. CKD.

## 2018-02-14 ENCOUNTER — Ambulatory Visit: Payer: Medicare Other | Admitting: Gastroenterology

## 2018-03-19 ENCOUNTER — Telehealth: Payer: Self-pay

## 2018-03-19 NOTE — Telephone Encounter (Signed)
Covid-19 travel screening questions  Have you traveled in the last 14 days? no If yes where?  Do you now or have you had a fever in the last 14 days? no  Do you have any respiratory symptoms of shortness of breath or cough now or in the last 14 days? no  Do you have a medical history of Congestive Heart Failure? yes  Do you have a medical history of lung disease? no Do you have any family members or close contacts with diagnosed or suspected Covid-19?no  Patient over 66 and hx CHF.  We will cancel the appt for now. She is feeling well.  She will call back if her symptoms return or she has new symptoms.

## 2018-03-20 ENCOUNTER — Ambulatory Visit: Payer: Medicare Other | Admitting: Gastroenterology

## 2018-03-27 ENCOUNTER — Ambulatory Visit: Payer: Medicare Other | Admitting: Cardiovascular Disease

## 2018-04-10 ENCOUNTER — Telehealth: Payer: Self-pay | Admitting: Cardiovascular Disease

## 2018-04-10 NOTE — Telephone Encounter (Signed)
   Primary Cardiologist:  Shelva Majestic, MD  Patient contacted.  History reviewed.  No symptoms to suggest any unstable cardiac conditions.  Based on discussion, with current pandemic situation, we will be postponing this appointment for Angelica French with a plan for f/u in 4 wks or sooner if feasible/necessary.  If symptoms change, she has been instructed to contact our office.   Offered to reschedule appt to see a PA, but pt declined and would like to wait for in person ov or to be scheduled with MD.  Routing to C19 CANCEL pool for tracking (P CV DIV CV19 CANCEL - reason for visit "other.") and assigning priority (1 = 4-6 wks, 2 = 6-12 wks, 3 = >12 wks).   Therisa Doyne  04/10/2018 10:47 AM         .'

## 2018-04-17 ENCOUNTER — Ambulatory Visit: Payer: Medicare Other | Admitting: Cardiovascular Disease

## 2018-05-28 ENCOUNTER — Inpatient Hospital Stay (HOSPITAL_COMMUNITY): Payer: Medicare Other

## 2018-05-28 ENCOUNTER — Telehealth: Payer: Self-pay | Admitting: Gastroenterology

## 2018-05-28 ENCOUNTER — Emergency Department (HOSPITAL_COMMUNITY): Payer: Medicare Other

## 2018-05-28 ENCOUNTER — Encounter (HOSPITAL_COMMUNITY): Payer: Self-pay | Admitting: Emergency Medicine

## 2018-05-28 ENCOUNTER — Inpatient Hospital Stay (HOSPITAL_COMMUNITY)
Admission: EM | Admit: 2018-05-28 | Discharge: 2018-06-02 | DRG: 389 | Disposition: A | Discharge status: Home / Self Care | Payer: Medicare Other | Attending: Internal Medicine | Admitting: Internal Medicine

## 2018-05-28 DIAGNOSIS — N183 Chronic kidney disease, stage 3 unspecified: Secondary | ICD-10-CM | POA: Diagnosis present

## 2018-05-28 DIAGNOSIS — K56609 Unspecified intestinal obstruction, unspecified as to partial versus complete obstruction: Secondary | ICD-10-CM | POA: Diagnosis not present

## 2018-05-28 DIAGNOSIS — Z91048 Other nonmedicinal substance allergy status: Secondary | ICD-10-CM

## 2018-05-28 DIAGNOSIS — I129 Hypertensive chronic kidney disease with stage 1 through stage 4 chronic kidney disease, or unspecified chronic kidney disease: Secondary | ICD-10-CM | POA: Diagnosis present

## 2018-05-28 DIAGNOSIS — E876 Hypokalemia: Secondary | ICD-10-CM | POA: Diagnosis not present

## 2018-05-28 DIAGNOSIS — K56600 Partial intestinal obstruction, unspecified as to cause: Principal | ICD-10-CM | POA: Diagnosis present

## 2018-05-28 DIAGNOSIS — Z886 Allergy status to analgesic agent status: Secondary | ICD-10-CM

## 2018-05-28 DIAGNOSIS — I701 Atherosclerosis of renal artery: Secondary | ICD-10-CM | POA: Diagnosis present

## 2018-05-28 DIAGNOSIS — K435 Parastomal hernia without obstruction or  gangrene: Secondary | ICD-10-CM | POA: Diagnosis present

## 2018-05-28 DIAGNOSIS — Z9049 Acquired absence of other specified parts of digestive tract: Secondary | ICD-10-CM

## 2018-05-28 DIAGNOSIS — E1122 Type 2 diabetes mellitus with diabetic chronic kidney disease: Secondary | ICD-10-CM | POA: Diagnosis not present

## 2018-05-28 DIAGNOSIS — Z8249 Family history of ischemic heart disease and other diseases of the circulatory system: Secondary | ICD-10-CM

## 2018-05-28 DIAGNOSIS — Z96653 Presence of artificial knee joint, bilateral: Secondary | ICD-10-CM | POA: Diagnosis present

## 2018-05-28 DIAGNOSIS — Z79899 Other long term (current) drug therapy: Secondary | ICD-10-CM

## 2018-05-28 DIAGNOSIS — Z7984 Long term (current) use of oral hypoglycemic drugs: Secondary | ICD-10-CM | POA: Diagnosis not present

## 2018-05-28 DIAGNOSIS — Z888 Allergy status to other drugs, medicaments and biological substances status: Secondary | ICD-10-CM | POA: Diagnosis not present

## 2018-05-28 DIAGNOSIS — Z0189 Encounter for other specified special examinations: Secondary | ICD-10-CM

## 2018-05-28 DIAGNOSIS — I130 Hypertensive heart and chronic kidney disease with heart failure and stage 1 through stage 4 chronic kidney disease, or unspecified chronic kidney disease: Secondary | ICD-10-CM | POA: Diagnosis present

## 2018-05-28 DIAGNOSIS — I509 Heart failure, unspecified: Secondary | ICD-10-CM | POA: Diagnosis present

## 2018-05-28 DIAGNOSIS — Z79891 Long term (current) use of opiate analgesic: Secondary | ICD-10-CM | POA: Diagnosis not present

## 2018-05-28 DIAGNOSIS — R109 Unspecified abdominal pain: Secondary | ICD-10-CM | POA: Diagnosis present

## 2018-05-28 DIAGNOSIS — Z90710 Acquired absence of both cervix and uterus: Secondary | ICD-10-CM | POA: Diagnosis not present

## 2018-05-28 DIAGNOSIS — Z8711 Personal history of peptic ulcer disease: Secondary | ICD-10-CM | POA: Diagnosis not present

## 2018-05-28 DIAGNOSIS — Z1159 Encounter for screening for other viral diseases: Secondary | ICD-10-CM | POA: Diagnosis not present

## 2018-05-28 LAB — URINALYSIS, ROUTINE W REFLEX MICROSCOPIC
Bilirubin Urine: NEGATIVE
Glucose, UA: NEGATIVE mg/dL
Hgb urine dipstick: NEGATIVE
Ketones, ur: NEGATIVE mg/dL
Nitrite: NEGATIVE
Protein, ur: NEGATIVE mg/dL
Specific Gravity, Urine: 1.035 — ABNORMAL HIGH (ref 1.005–1.030)
pH: 5 (ref 5.0–8.0)

## 2018-05-28 LAB — COMPREHENSIVE METABOLIC PANEL
ALT: 14 U/L (ref 0–44)
AST: 17 U/L (ref 15–41)
Albumin: 3.5 g/dL (ref 3.5–5.0)
Alkaline Phosphatase: 84 U/L (ref 38–126)
Anion gap: 13 (ref 5–15)
BUN: 25 mg/dL — ABNORMAL HIGH (ref 8–23)
CO2: 19 mmol/L — ABNORMAL LOW (ref 22–32)
Calcium: 9.4 mg/dL (ref 8.9–10.3)
Chloride: 106 mmol/L (ref 98–111)
Creatinine, Ser: 1.43 mg/dL — ABNORMAL HIGH (ref 0.44–1.00)
GFR calc Af Amer: 41 mL/min — ABNORMAL LOW (ref >60)
GFR calc non Af Amer: 35 mL/min — ABNORMAL LOW (ref >60)
Glucose, Bld: 184 mg/dL — ABNORMAL HIGH (ref 70–99)
Potassium: 4 mmol/L (ref 3.5–5.1)
Sodium: 138 mmol/L (ref 135–145)
Total Bilirubin: 1 mg/dL (ref 0.3–1.2)
Total Protein: 7 g/dL (ref 6.5–8.1)

## 2018-05-28 LAB — CBC
HCT: 38.7 % (ref 36.0–46.0)
Hemoglobin: 12.5 g/dL (ref 12.0–15.0)
MCH: 27.8 pg (ref 26.0–34.0)
MCHC: 32.3 g/dL (ref 30.0–36.0)
MCV: 86.2 fL (ref 80.0–100.0)
Platelets: 198 10*3/uL (ref 150–400)
RBC: 4.49 MIL/uL (ref 3.87–5.11)
RDW: 15.1 % (ref 11.5–15.5)
WBC: 9.5 10*3/uL (ref 4.0–10.5)
nRBC: 0 % (ref 0.0–0.2)

## 2018-05-28 LAB — CBG MONITORING, ED: Glucose-Capillary: 114 mg/dL — ABNORMAL HIGH (ref 70–99)

## 2018-05-28 LAB — LIPASE, BLOOD: Lipase: 40 U/L (ref 11–51)

## 2018-05-28 LAB — SARS CORONAVIRUS 2 BY RT PCR (HOSPITAL ORDER, PERFORMED IN ~~LOC~~ HOSPITAL LAB): SARS Coronavirus 2: NEGATIVE

## 2018-05-28 MED ORDER — ACETAMINOPHEN 325 MG PO TABS: 650.0000 mg | ORAL_TABLET | Freq: Four times a day (QID) | ORAL | Status: DC | PRN

## 2018-05-28 MED ORDER — SODIUM CHLORIDE 0.9 % IV SOLN
INTRAVENOUS | Status: DC
  Administered 2018-05-28 – 2018-05-29 (×3): via INTRAVENOUS

## 2018-05-28 MED ORDER — INSULIN ASPART 100 UNIT/ML ~~LOC~~ SOLN
0.0000 [IU] | SUBCUTANEOUS | Status: DC
  Administered 2018-05-29 – 2018-06-01 (×8): 1 [IU] via SUBCUTANEOUS

## 2018-05-28 MED ORDER — DIATRIZOATE MEGLUMINE & SODIUM 66-10 % PO SOLN
90.0000 mL | Freq: Once | ORAL | Status: AC
  Administered 2018-05-29: 90 mL via NASOGASTRIC
  Filled 2018-05-28: qty 90

## 2018-05-28 MED ORDER — ONDANSETRON HCL 4 MG PO TABS: 4.0000 mg | ORAL_TABLET | Freq: Four times a day (QID) | ORAL | Status: DC | PRN

## 2018-05-28 MED ORDER — ACETAMINOPHEN 650 MG RE SUPP: 650.0000 mg | Freq: Four times a day (QID) | RECTAL | Status: DC | PRN

## 2018-05-28 MED ORDER — ONDANSETRON HCL 4 MG/2ML IJ SOLN: 4.0000 mg | Freq: Four times a day (QID) | INTRAMUSCULAR | Status: DC | PRN

## 2018-05-28 MED ORDER — MORPHINE SULFATE (PF) 2 MG/ML IV SOLN
2.0000 mg | Freq: Once | INTRAVENOUS | Status: AC
  Administered 2018-05-28: 2 mg via INTRAVENOUS
  Filled 2018-05-28: qty 1

## 2018-05-28 MED ORDER — ENOXAPARIN SODIUM 40 MG/0.4ML ~~LOC~~ SOLN
40.0000 mg | SUBCUTANEOUS | Status: DC
  Administered 2018-05-29 – 2018-06-02 (×5): 40 mg via SUBCUTANEOUS
  Filled 2018-05-28 (×5): qty 0.4

## 2018-05-28 MED ORDER — IOHEXOL 300 MG/ML  SOLN
100.0000 mL | Freq: Once | INTRAMUSCULAR | Status: AC | PRN
  Administered 2018-05-28: 100 mL via INTRAVENOUS

## 2018-05-28 MED ORDER — SODIUM CHLORIDE 0.9% FLUSH: 3.0000 mL | Freq: Once | INTRAVENOUS | Status: DC

## 2018-05-28 NOTE — ED Provider Notes (Signed)
[facility] EMERGENCY DEPARTMENT Provider Note   CSN: [id number] Arrival date & time: 05/28/18  1252    History   Chief Complaint Chief Complaint  Patient presents with  . Abdominal Pain    HPI Angelica French is a 78 y.o. female.     78 year old female presents with abdominal discomfort with some associated emesis x1.  Has a history of UC and has ileostomy on the left side.  Has a history of bowel obstructions and this feels similar.  Has noted output through her ostomy site.  Her emesis was dark but denies any blood.  No fever or chills.  Has noted crampiness that began after she ate coleslaw and chicken.  Nothing makes her symptoms better no treatment used prior to arrival.     Past Medical History:  Diagnosis Date  . Arthritis   . Bleeding ulcer   . CHF (congestive heart failure) ([place])   . Chronic kidney disease    Renal artery stenosis - right. Left kindey ok- sees Dr Neta Ehlers- nepjrologist  . Diabetes mellitus without complication ([place])    type II  . Fundic gland polyps of stomach, benign   . Gastric ulcer 1994   bleeding  . History of blood transfusion    1970- illeostomy 2004-  . Hypertension   . UC (ulcerative colitis) ([place])   . VRE (vancomycin resistant enterococcus) culture positive 2004    Patient Active Problem List   Diagnosis Date Noted  . 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 Erosive gastritis 02/01/2018  . Duodenitis 02/01/2018  . Postoperative urinary retention 06/28/2017    Class: Acute  . Urinary tract infection 06/28/2017    Class: Present on Admission  . Anemia due to blood loss 06/28/2017    Class: Acute  . S/P TKR (total knee replacement) using cement, right 06/25/2017  . Diabetes mellitus type 2 without retinopathy ([place]) 06/04/2017  . Bilateral primary osteoarthritis of knee 04/13/2017    Class: Chronic  . Status post left knee replacement 04/13/2017  . Dry eye syndrome of both lacrimal  glands 05/25/2016  . Myopia with astigmatism and presbyopia, bilateral 05/25/2016  . Posterior vitreous detachment of both eyes 05/25/2016  . Refractive amblyopia of left eye 05/25/2016  . Diabetes mellitus without complication ([place]) 93/71/6967  . Depression 03/01/2016  . Hypokalemia 08/31/2015  . CKD (chronic kidney disease) stage 3, GFR 30-59 ml/min ([facility]) 07/08/2015  . Type 2 diabetes mellitus with diabetic chronic kidney disease ([place]) 06/29/2015  . Atrophy of right kidney 04/26/2015  . Benign hypertension with CKD (chronic kidney disease) stage III ([place]) 03/22/2015  . Mixed hyperlipidemia 03/22/2015  . Renal artery stenosis of unknown cause ([place]) 02/17/2015  . Right renal artery stenosis ([place]) 02/17/2015  . Hyperlipidemia 09/16/2014  . Type 2 diabetes mellitus ([place]) 09/16/2014    Past Surgical History:  Procedure Laterality Date  . ABDOMINAL HYSTERECTOMY    . ABSCESS DRAINAGE     abdominal  . BACK SURGERY Left 1997   Left 5 DISECTOMY  . BLADDER SUSPENSION  1998  . EYE SURGERY Bilateral    cataract  . HERNIA MESH REMOVAL  2004   relocation of ileostomy to opposite side of abdomen  . PERMANENT ILEOSTOMY  1970  . stoma hernia  2004   attempted- resulting in perforated bowel and infection  . stoma; Hernia repair     2003- attempted  . TOTAL KNEE ARTHROPLASTY Left 04/13/2017   Procedure: LEFT TOTAL KNEE ARTHROPLASTY AND  RIGHT KNEE INTRA-ARTICULAR STEROID INJECTION;  Surgeon: Jessy Oto, MD;  Location: [facility];  Service: Orthopedics;  Laterality: Left;  . TOTAL KNEE ARTHROPLASTY Right 06/25/2017  . TOTAL KNEE ARTHROPLASTY Right 06/25/2017   Procedure: RIGHT TOTAL KNEE ARTHROPLASTY;  Surgeon: Jessy Oto, MD;  Location: [facility];  Service: Orthopedics;  Laterality: Right;     OB History   No obstetric history on file.      Home Medications    Prior to Admission medications   Medication Sig Start Date End Date Taking? Authorizing Provider  atorvastatin (LIPITOR) 20 MG  tablet Take 20 mg by mouth. On Monday, Wednesday and Friday 12/15/14   [provider]  Biotin 2500 MCG CAPS Take 2,500 mcg by mouth daily.     [provider]  carvedilol (COREG) 25 MG tablet Take 25 mg by mouth at bedtime.     [provider]  cholecalciferol (VITAMIN D) 1000 units tablet Take 1,000 Units by mouth every other day.     [provider]  clindamycin (CLEOCIN) 2 % vaginal cream Place 1 Applicatorful vaginally daily as needed (irritation).  02/16/15   [provider]  Desoximetasone 0.05 % GEL Apply 1 application topically daily as needed (ostomy pouch irritation). 06/28/17   Jessy Oto, MD  famotidine (PEPCID) 20 MG tablet Take 1 tablet (20 mg total) by mouth 2 (two) times daily. 02/01/18   Ladene Artist, MD  furosemide (LASIX) 40 MG tablet Take 40 mg by mouth daily as needed for fluid or edema.    [provider]  gabapentin (NEURONTIN) 300 MG capsule Take 2 capsules (600 mg total) by mouth 3 (three) times daily. Patient taking differently: Take 600 mg by mouth at bedtime.  09/24/17   Jessy Oto, MD  glimepiride (AMARYL) 2 MG tablet Take 2 mg by mouth at bedtime.    [provider]  glimepiride (AMARYL) 4 MG tablet Take 1 tablet (4 mg total) by mouth 2 times daily at 12 noon and 4 pm. 04/17/17   Jessy Oto, MD  hydrALAZINE (APRESOLINE) 50 MG tablet Take 50 mg by mouth twice daily 10/17/16   [provider]  hydrochlorothiazide (HYDRODIURIL) 25 MG tablet Take 25 mg by mouth every morning.  02/23/17   [provider]  HYDROcodone-acetaminophen (NORCO) 7.5-325 MG tablet Take 1 tablet by mouth every 6 (six) hours as needed for severe pain (pain score 7-10). 09/24/17   Jessy Oto, MD  liraglutide (VICTOZA) 18 MG/3ML SOPN Inject 0.6 mg once daily 05/26/14   [provider]  losartan (COZAAR) 50 MG tablet Take 50 mg by mouth at bedtime.  06/16/16   [provider]  potassium  bicarbonate (K-LYTE) 25 MEQ disintegrating tablet Take 25 mEq by mouth as needed.    [provider]    Family History Family History  Problem Relation Age of Onset  . Heart disease Father     Social History Social History   Tobacco Use  . Smoking status: Never Smoker  . Smokeless tobacco: Never Used  Substance Use Topics  . Alcohol use: Yes    Comment: occasional drink with liquor  . Drug use: Never     Allergies   Adhesive [tape] and Nsaids   Review of Systems Review of Systems  All other systems reviewed and are negative.    Physical Exam Updated Vital Signs BP (!) 118/58 (BP Location: Left Arm)   Pulse 77   Temp 98.6 F (  37 C) (Oral)   Resp 18   SpO2 96%   Physical Exam Vitals signs and nursing note reviewed.  Constitutional:      General: She is not in acute distress.    Appearance: Normal appearance. She is well-developed. She is not toxic-appearing.  HENT:     Head: Normocephalic and atraumatic.  Eyes:     General: Lids are normal.     Conjunctiva/sclera: Conjunctivae normal.     Pupils: Pupils are equal, round, and reactive to light.  Neck:     Musculoskeletal: Normal range of motion and neck supple.     Thyroid: No thyroid mass.     Trachea: No tracheal deviation.  Cardiovascular:     Rate and Rhythm: Normal rate and regular rhythm.     Heart sounds: Normal heart sounds. No murmur. No gallop.   Pulmonary:     Effort: Pulmonary effort is normal. No respiratory distress.     Breath sounds: Normal breath sounds. No stridor. No decreased breath sounds, wheezing, rhonchi or rales.  Abdominal:     General: There is distension.     Palpations: Abdomen is soft.     Tenderness: There is generalized abdominal tenderness. There is no guarding or rebound.       Comments: Multiple healed surgical scars noted.  Musculoskeletal: Normal range of motion.        General: No tenderness.  Skin:    General: Skin is warm and dry.     Findings: No  abrasion or rash.  Neurological:     Mental Status: She is alert and oriented to person, place, and time.     GCS: GCS eye subscore is 4. GCS verbal subscore is 5. GCS motor subscore is 6.     Cranial Nerves: No cranial nerve deficit.     Sensory: No sensory deficit.  Psychiatric:        Speech: Speech normal.        Behavior: Behavior normal.      ED Treatments / Results  Labs (all labs ordered are listed, but only abnormal results are displayed) Labs Reviewed  COMPREHENSIVE METABOLIC PANEL - Abnormal; Notable for the following components:      Result Value   CO2 19 (*)    Glucose, Bld 184 (*)    BUN 25 (*)    Creatinine, Ser 1.43 (*)    GFR calc non Af Amer 35 (*)    GFR calc Af Amer 41 (*)    All other components within normal limits  LIPASE, BLOOD  CBC  URINALYSIS, ROUTINE W REFLEX MICROSCOPIC    EKG None  Radiology No results found.  Procedures Procedures (including critical care time)  Medications Ordered in ED Medications  sodium chloride flush (NS) 0.9 % injection 3 mL (has no administration in time range)     Initial Impression / Assessment and Plan / ED Course  I have reviewed the triage vital signs and the nursing notes.  Pertinent labs & imaging results that were available during my care of the patient were reviewed by me and considered in my medical decision making (see chart for details).        Patient's abdominal CT consistent with small bowel obstruction.  Discussed case with Dr. Brantley Stage from general surgery he recommends NG tube and medicine admission  Final Clinical Impressions(s) / ED Diagnoses   Final diagnoses:  None    ED Discharge Orders    None  Lacretia Leigh, MD 05/28/18 2051

## 2018-05-28 NOTE — ED Notes (Signed)
CBG 114 

## 2018-05-28 NOTE — Telephone Encounter (Signed)
Left message for patient to call back  

## 2018-05-28 NOTE — Consult Note (Signed)
Reason for Consult: Small bowel obstruction Referring Physician: Zenia Resides MD  Angelica French is an 78 y.o. female.  HPI: Asked to see patient at the request of Dr. Zenia Resides due to abdominal pain.  Patient relates a history of 48 hours of crampy diffuse abdominal pain.  She has a history of a total abdominal colectomy over 30 years ago secondary to ulcerative colitis.  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  She states Monday morning she developed crampy abdominal pain.  She had some nausea and only one episode of vomiting.  Her ostomy has been working she states.  CT scan shows small bowel obstruction.  Currently, she is comfortable.  Past Medical History:  Diagnosis Date   Arthritis    Bleeding ulcer    CHF (congestive heart failure) ([facility])    Chronic kidney disease    Renal artery stenosis - right. Left kindey ok- sees Dr Neta Ehlers- nepjrologist   Diabetes mellitus without complication ([place])    type II   Fundic gland polyps of stomach, benign    Gastric ulcer 1994   bleeding   History of blood transfusion    1970- illeostomy 2004-   Hypertension    UC (ulcerative colitis) ([place])    VRE (vancomycin resistant enterococcus) culture positive 2004    Past Surgical History:  Procedure Laterality Date   ABDOMINAL HYSTERECTOMY     ABSCESS DRAINAGE     abdominal   BACK SURGERY Left 1997   Left [place]   EYE SURGERY Bilateral    cataract   HERNIA MESH REMOVAL  2004   relocation of ileostomy to opposite side of abdomen   PERMANENT ILEOSTOMY  1970   stoma hernia  2004   attempted- resulting in perforated bowel and infection   stoma; Hernia repair     2003- attempted   TOTAL KNEE ARTHROPLASTY Left 04/13/2017   Procedure: LEFT TOTAL KNEE ARTHROPLASTY AND RIGHT KNEE INTRA-ARTICULAR STEROID INJECTION;  Surgeon: Jessy Oto, MD;  Location: [facility];  Service: Orthopedics;  Laterality: Left;   TOTAL KNEE  ARTHROPLASTY Right 06/25/2017   TOTAL KNEE ARTHROPLASTY Right 06/25/2017   Procedure: RIGHT TOTAL KNEE ARTHROPLASTY;  Surgeon: Jessy Oto, MD;  Location: [facility];  Service: Orthopedics;  Laterality: Right;    Family History  Problem Relation Age of Onset   Heart disease Father     Social History:  reports that she has never smoked. She has never used smokeless tobacco. She reports current alcohol use. She reports that she does not use drugs.  Allergies:  Allergies  Allergen Reactions   Adhesive [Tape] Hives and Itching   Nsaids Nausea Only and Other (See Comments)    Gi pain after prolonged use  ULCER BUFFERED ASA ALSO INCLUDED    Medications: I have reviewed the patient's current medications.  Results for orders placed or performed during the hospital encounter of 05/28/18 (from the past 48 hour(s))  Lipase, blood     Status: None   Collection Time: 05/28/18  1:07 PM  Result Value Ref Range   Lipase 40 11 - 51 U/L    Comment: Performed at [facility], [place] [address], [place], [place] [address]  Comprehensive metabolic panel     Status: Abnormal   Collection Time: 05/28/18  1:07 PM  Result Value Ref Range   Sodium 138 135 - 145 mmol/L   Potassium 4.0 3.5 - 5.1 mmol/L  Chloride 106 98 - 111 mmol/L   CO2 19 (L) 22 - 32 mmol/L   Glucose, Bld 184 (H) 70 - 99 mg/dL   BUN 25 (H) 8 - 23 mg/dL   Creatinine, Ser 1.43 (H) 0.44 - 1.00 mg/dL   Calcium 9.4 8.9 - 10.3 mg/dL   Total Protein 7.0 6.5 - 8.1 g/dL   Albumin 3.5 3.5 - 5.0 g/dL   AST 17 15 - 41 U/L   ALT 14 0 - 44 U/L   Alkaline Phosphatase 84 38 - 126 U/L   Total Bilirubin 1.0 0.3 - 1.2 mg/dL   GFR calc non Af Amer 35 (L) >60 mL/min   GFR calc Af Amer 41 (L) >60 mL/min   Anion gap 13 5 - 15    Comment: Performed at [facility] [address], [place], [place] [address]  CBC     Status: None   Collection Time: 05/28/18  1:07 PM  Result Value Ref Range   WBC 9.5 4.0 - 10.5 K/uL   RBC 4.49 3.87 -  5.11 MIL/uL   Hemoglobin 12.5 12.0 - 15.0 g/dL   HCT 38.7 36.0 - 46.0 %   MCV 86.2 80.0 - 100.0 fL   MCH 27.8 26.0 - 34.0 pg   MCHC 32.3 30.0 - 36.0 g/dL   RDW 15.1 11.5 - 15.5 %   Platelets 198 150 - 400 K/uL   nRBC 0.0 0.0 - 0.2 %    Comment: Performed at [facility], [place] [address], [place], [place] [address]  Urinalysis, Routine w reflex microscopic     Status: Abnormal   Collection Time: 05/28/18  8:38 PM  Result Value Ref Range   Color, Urine YELLOW YELLOW   APPearance HAZY (A) CLEAR   Specific Gravity, Urine 1.035 (H) 1.005 - 1.030   pH 5.0 5.0 - 8.0   Glucose, UA NEGATIVE NEGATIVE mg/dL   Hgb urine dipstick NEGATIVE NEGATIVE   Bilirubin Urine NEGATIVE NEGATIVE   Ketones, ur NEGATIVE NEGATIVE mg/dL   Protein, ur NEGATIVE NEGATIVE mg/dL   Nitrite NEGATIVE NEGATIVE   Leukocytes,Ua MODERATE (A) NEGATIVE   RBC / HPF 0-5 0 - 5 RBC/hpf   WBC, UA 6-10 0 - 5 WBC/hpf   Bacteria, UA RARE (A) NONE SEEN   Squamous Epithelial / LPF 0-5 0 - 5   Mucus PRESENT    Hyaline Casts, UA PRESENT     Comment: Performed at [facility], [place] [address] [place], [place] [address]    Ct Abdomen Pelvis W Contrast  Result Date: 05/28/2018 CLINICAL DATA:  Abdominal pain with nausea and vomiting EXAM: CT ABDOMEN AND PELVIS WITH CONTRAST TECHNIQUE: Multidetector CT imaging of the abdomen and pelvis was performed using the standard protocol following bolus administration of intravenous contrast. CONTRAST:  124mL OMNIPAQUE IOHEXOL 300 MG/ML  SOLN COMPARISON:  November 15, 2017 FINDINGS: Lower chest: There is mild scarring in the lung bases. There is no lung base edema or consolidation. Hepatobiliary: There is hepatic steatosis. No focal liver lesions are demonstrable. The gallbladder wall is not appreciably thickened. There is no appreciable biliary duct dilatation. Pancreas: There remains a 6 x 5 mm focus of decreased attenuation in the uncinate process of the pancreas. No other pancreatic mass  evident. There is no appreciable pancreatic duct dilatation. No peripancreatic inflammation. Spleen: No splenic lesions are evident. Adrenals/Urinary Tract: Adrenals appear normal bilaterally. Right kidney is atrophic with marked renal cortical thinning. There is a 7  mm cyst arising from the periphery the lower pole of the left kidney. There is no evident renal mass or hydronephrosis on either side. There is no renal or ureteral calculus on either side. Urinary bladder is midline with wall thickness within normal limits. Stomach/Bowel: There is again noted evidence of previous colectomy with left lower quadrant ostomy. 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 There is proximal small bowel dilatation with a transition zone in the distal jejunal region consistent with bowel obstruction. No free air or portal venous air is evident. Vascular/Lymphatic: There is aortic and iliac artery atherosclerosis. No aneurysm evident. Major mesenteric arterial vessels appear patent. No adenopathy is appreciable in the abdomen or pelvis. Reproductive: Uterus is absent.  No pelvic mass evident. Other: Soft tissue thickening in the posterior portion of pelvis is likely of postoperative etiology. No abscess or ascites is evident in the abdomen or pelvis. Musculoskeletal: There is stable 3 mm of anterolisthesis of L4 on L5, felt to be due to underlying spondylosis. There are no blastic or lytic bone lesions. No intramuscular lesions are evident. IMPRESSION: 1. There is transition zone consistent with bowel obstruction near the jejunoileal junction. No appreciable free air. 2. 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 No bowel obstruction in the region of this hernia. 3. 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 Recommend follow up pre and post contrast MRI/MRCP or  pancreatic protocol CT in 2 years. This recommendation follows ACR consensus guidelines: Management of Incidental Pancreatic Cysts: A White Paper of the ACR Incidental Findings Committee. [place] 5176;16:[id number]. 4.  Hepatic steatosis. 5.  Aortoiliac atherosclerosis. 6.  Stable mild spondylolisthesis at L4-5. 7. Chronic right renal atrophy. No renal or ureteral calculus. No hydronephrosis. Urinary bladder wall thickness within normal limits. Electronically Signed   By: Lowella Grip III M.D.   On: 05/28/2018 20:24    Review of Systems  Gastrointestinal: Positive for abdominal pain, nausea and vomiting.  All other systems reviewed and are negative.  Blood pressure (!) 118/58, pulse 77, temperature 98.6 F (37 C), temperature source Oral, resp. rate 18, SpO2 96 %. Physical Exam  Constitutional: She is oriented to person, place, and time. She appears well-developed and well-nourished.  HENT:  Head: Normocephalic and atraumatic.  Eyes: Pupils are equal, round, and reactive to light. EOM are normal.  Neck: Normal range of motion. Neck supple.  Cardiovascular: Normal rate and regular rhythm.  Respiratory: Effort normal and breath sounds normal.  GI: She exhibits distension. She exhibits no mass. There is abdominal tenderness. There is no rebound and no guarding.    Neurological: She is alert and oriented to person, place, and time.  Skin: Skin is warm and dry.  Psychiatric: She has a normal mood and affect. Her behavior is normal.    Assessment/Plan: Small bowel obstruction-partial  Recommend NG tube decompression  Small bowel protocol  No acute surgical indication  Repeat films in a.m.  Recommended medical mission due to multiple complex medical problems  Joyice Faster Buddy Loeffelholz 05/28/2018, 9:03 PM

## 2018-05-28 NOTE — Telephone Encounter (Signed)
Patient feels sick stomach pain (discribed it as labor pain) patient said she has a ileostomy. Denies any fever. Patient would like to have some advise on what to do.

## 2018-05-28 NOTE — H&P (Signed)
History and Physical    Angelica French ZDG:[id number] DOB: [birth year] DOA: 05/28/2018  PCP: Thornton Dales I, MD  Patient coming from: Home  I have personally briefly reviewed patient's old medical records in [place]  Chief Complaint: Abd pain  HPI: Angelica French is a 78 y.o. female with medical history significant of UC s/p total colectomy some 30 years ago.  H/o recurrent SBOs in past, DM2, HTN.  Patient presents to the ED with c/o abd pain for 48h, diffuse, crampy.  Nausea and 1 episode of vomiting.  Ostomy working.    ED Course: CT shows SBO not at ostomy site.    Review of Systems: As per HPI otherwise 10 point review of systems negative.   Past Medical History:  Diagnosis Date   Arthritis    Bleeding ulcer    CHF (congestive heart failure) (HCC)    Chronic kidney disease    Renal artery stenosis - right. Left kindey ok- sees Dr Neta Ehlers- nepjrologist   Diabetes mellitus without complication ([place])    type II   Fundic gland polyps of stomach, benign    Gastric ulcer 1994   bleeding   History of blood transfusion    1970- illeostomy 2004-   Hypertension    UC (ulcerative colitis) ([place])    VRE (vancomycin resistant enterococcus) culture positive 2004    Past Surgical History:  Procedure Laterality Date   ABDOMINAL HYSTERECTOMY     ABSCESS DRAINAGE     abdominal   BACK SURGERY Left 1997   Left [place]   EYE SURGERY Bilateral    cataract   HERNIA MESH REMOVAL  2004   relocation of ileostomy to opposite side of abdomen   PERMANENT ILEOSTOMY  1970   stoma hernia  2004   attempted- resulting in perforated bowel and infection   stoma; Hernia repair     2003- attempted   TOTAL KNEE ARTHROPLASTY Left 04/13/2017   Procedure: LEFT TOTAL KNEE ARTHROPLASTY AND RIGHT KNEE INTRA-ARTICULAR STEROID INJECTION;  Surgeon: Jessy Oto, MD;  Location: [facility];  Service: Orthopedics;  Laterality: Left;   TOTAL  KNEE ARTHROPLASTY Right 06/25/2017   TOTAL KNEE ARTHROPLASTY Right 06/25/2017   Procedure: RIGHT TOTAL KNEE ARTHROPLASTY;  Surgeon: Jessy Oto, MD;  Location: [facility];  Service: Orthopedics;  Laterality: Right;     reports that she has never smoked. She has never used smokeless tobacco. She reports current alcohol use. She reports that she does not use drugs.  Allergies  Allergen Reactions   Nsaids Nausea Only and Other (See Comments)    GI pain after prolonged use ULCERS BUFFERED ASA ALSO INCLUDED Other reaction(s): Other (See Comments) Gi pain after prolonged use  ULCER BUFFERED ASA ALSO INCLUDED ULCER   Tape Hives and Itching    Other reaction(s): Other (See Comments)   Aspirin     Other reaction(s): Other (See Comments) UNKNOWN   Coreg [Carvedilol] Other (See Comments)    "Makes my back hurt"    Family History  Problem Relation Age of Onset   Heart disease Father      Prior to Admission medications   Medication Sig Start Date End Date Taking? Authorizing Provider  acetaminophen (TYLENOL) 500 MG tablet Take 1,000 mg by mouth every 6 (six) hours as needed (for pain).   Yes [provider]  atorvastatin (LIPITOR) 20 MG tablet Take 20 mg by mouth. On Monday, Wednesday and Friday 12/15/14  Yes [provider]  Biotin 2500 MCG CAPS Take 2,500 mcg by mouth daily.    Yes [provider]  carvedilol (COREG) 25 MG tablet Take 12.5-25 mg by mouth See admin instructions. Take 12.5 mg by mouth in the morning and 25 mg at bedtime   Yes [provider]  cholecalciferol (VITAMIN D) 1000 units tablet Take 1,000 Units by mouth every other day.    Yes [provider]  Cholecalciferol (VITAMIN D-3) 25 MCG (1000 UT) CAPS Take 1,000 Units by mouth every other day.   Yes [provider]  clindamycin (CLEOCIN) 2 % vaginal cream Place 1 Applicatorful vaginally daily as needed (irritation).  02/16/15  Yes [provider]    Desoximetasone 0.05 % GEL Apply 1 application topically daily as needed (ostomy pouch irritation). 06/28/17  Yes Jessy Oto, MD  famotidine (PEPCID) 20 MG tablet Take 1 tablet (20 mg total) by mouth 2 (two) times daily. 02/01/18  Yes Ladene Artist, MD  furosemide (LASIX) 40 MG tablet Take 40 mg by mouth daily as needed for fluid or edema.   Yes [provider]  glimepiride (AMARYL) 2 MG tablet Take 2 mg by mouth See admin instructions. Take 2 mg by mouth in the morning before breakfast and 2 mg in the evening after supper   Yes [provider]  hydrALAZINE (APRESOLINE) 100 MG tablet Take 100 mg by mouth 2 (two) times a day. 02/27/18  Yes [provider]  hydrochlorothiazide (HYDRODIURIL) 25 MG tablet Take 25 mg by mouth every morning.  02/23/17  Yes [provider]  HYDROcodone-acetaminophen (NORCO) 7.5-325 MG tablet Take 1 tablet by mouth every 6 (six) hours as needed for severe pain (pain score 7-10). 09/24/17  Yes Jessy Oto, MD  liraglutide (VICTOZA) 18 MG/3ML SOPN Inject 0.6 mg into the skin daily before breakfast.  05/26/14  Yes [provider]  losartan (COZAAR) 100 MG tablet Take 100 mg by mouth at bedtime.   Yes [provider]  Multiple Vitamins-Minerals (CENTRUM SILVER 50+WOMEN) TABS Take 1 tablet by mouth daily with breakfast.    Yes [provider]  omeprazole (PRILOSEC) 40 MG capsule Take 40 mg by mouth at bedtime. 03/25/18  Yes [provider]  Polyethyl Glycol-Propyl Glycol (SYSTANE) 0.4-0.3 % GEL ophthalmic gel Place 1 application into both eyes at bedtime.   Yes [provider]  potassium bicarbonate (K-LYTE) 25 MEQ disintegrating tablet Take 25 mEq by mouth daily as needed (ONLY WHEN TAKING FUROSEMIDE).    Yes [provider]  gabapentin (NEURONTIN) 300 MG capsule Take 2 capsules (600 mg total) by mouth 3 (three) times daily. Patient not taking: Reported on 05/28/2018 09/24/17   Jessy Oto, MD  glimepiride (AMARYL) 4 MG tablet Take 1 tablet (4 mg total) by mouth 2 times daily at 12 noon and 4 pm. Patient not taking: Reported on 05/28/2018 04/17/17   Jessy Oto, MD    Physical Exam: Vitals:   05/28/18 1301 05/28/18 1631 05/28/18 1832  BP: 139/76 (!) 128/54 (!) 118/58  Pulse: 77 71 77  Resp: 18 17 18   Temp: 97.8 F (36.6 C) 99.3 F (37.4 C) 98.6 F (37 C)  TempSrc: Oral Oral Oral  SpO2: 99% 97% 96%    Constitutional: NAD, calm, comfortable Eyes: PERRL, lids and conjunctivae normal ENMT: Mucous membranes are moist. Posterior pharynx clear of any exudate or lesions.Normal dentition.  Neck: normal, supple, no masses, no thyromegaly Respiratory: clear to auscultation  bilaterally, no wheezing, no crackles. Normal respiratory effort. No accessory muscle use.  Cardiovascular: Regular rate and rhythm, no murmurs / rubs / gallops. No extremity edema. 2+ pedal pulses. No carotid bruits.  Abdomen: no tenderness, no masses palpated. No hepatosplenomegaly. Bowel sounds positive.  Musculoskeletal: no clubbing / cyanosis. No joint deformity upper and lower extremities. Good ROM, no contractures. Normal muscle tone.  Skin: no rashes, lesions, ulcers. No induration Neurologic: CN 2-12 grossly intact. Sensation intact, DTR normal. Strength 5/5 in all 4.  Psychiatric: Normal judgment and insight. Alert and oriented x 3. Normal mood.    Labs on Admission: I have personally reviewed following labs and imaging studies  CBC: Recent Labs  Lab 05/28/18 1307  WBC 9.5  HGB 12.5  HCT 38.7  MCV 86.2  PLT 062   Basic Metabolic Panel: Recent Labs  Lab 05/28/18 1307  NA 138  K 4.0  CL 106  CO2 19*  GLUCOSE 184*  BUN 25*  CREATININE 1.43*  CALCIUM 9.4   GFR: CrCl cannot be calculated (Unknown ideal weight.). Liver Function Tests: Recent Labs  Lab 05/28/18 1307  AST 17  ALT 14  ALKPHOS 84  BILITOT 1.0  PROT 7.0  ALBUMIN 3.5   Recent Labs  Lab 05/28/18 1307    LIPASE 40   No results for input(s): AMMONIA in the last 168 hours. Coagulation Profile: No results for input(s): INR, PROTIME in the last 168 hours. Cardiac Enzymes: No results for input(s): CKTOTAL, CKMB, CKMBINDEX, TROPONINI in the last 168 hours. BNP (last 3 results) No results for input(s): PROBNP in the last 8760 hours. HbA1C: No results for input(s): HGBA1C in the last 72 hours. CBG: Recent Labs  Lab 05/28/18 2137  GLUCAP 114*   Lipid Profile: No results for input(s): CHOL, HDL, LDLCALC, TRIG, CHOLHDL, LDLDIRECT in the last 72 hours. Thyroid Function Tests: No results for input(s): TSH, T4TOTAL, FREET4, T3FREE, THYROIDAB in the last 72 hours. Anemia Panel: No results for input(s): VITAMINB12, FOLATE, FERRITIN, TIBC, IRON, RETICCTPCT in the last 72 hours. Urine analysis:    Component Value Date/Time   COLORURINE YELLOW 05/28/2018 2038   APPEARANCEUR HAZY (A) 05/28/2018 2038   LABSPEC 1.035 (H) 05/28/2018 2038   PHURINE 5.0 05/28/2018 2038   GLUCOSEU NEGATIVE 05/28/2018 2038   HGBUR NEGATIVE 05/28/2018 2038   BILIRUBINUR NEGATIVE 05/28/2018 2038   KETONESUR NEGATIVE 05/28/2018 2038   PROTEINUR NEGATIVE 05/28/2018 2038   NITRITE NEGATIVE 05/28/2018 2038   LEUKOCYTESUR MODERATE (A) 05/28/2018 2038    Radiological Exams on Admission: Ct Abdomen Pelvis W Contrast  Result Date: 05/28/2018 CLINICAL DATA:  Abdominal pain with nausea and vomiting EXAM: CT ABDOMEN AND PELVIS WITH CONTRAST TECHNIQUE: Multidetector CT imaging of the abdomen and pelvis was performed using the standard protocol following bolus administration of intravenous contrast. CONTRAST:  154mL OMNIPAQUE IOHEXOL 300 MG/ML  SOLN COMPARISON:  November 15, 2017 FINDINGS: Lower chest: There is mild scarring in the lung bases. There is no lung base edema or consolidation. Hepatobiliary: There is hepatic steatosis. No focal liver lesions are demonstrable. The gallbladder wall is not appreciably thickened. There is  no appreciable biliary duct dilatation. Pancreas: There remains a 6 x 5 mm focus of decreased attenuation in the uncinate process of the pancreas. No other pancreatic mass evident. There is no appreciable pancreatic duct dilatation. No peripancreatic inflammation. Spleen: No splenic lesions are evident. Adrenals/Urinary Tract: Adrenals appear normal bilaterally. Right kidney is atrophic with marked renal cortical thinning. There is a 7 mm  cyst arising from the periphery the lower pole of the left kidney. There is no evident renal mass or hydronephrosis on either side. There is no renal or ureteral calculus on either side. Urinary bladder is midline with wall thickness within normal limits. Stomach/Bowel: There is again noted evidence of previous colectomy with left lower quadrant ostomy. 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 There is proximal small bowel dilatation with a transition zone in the distal jejunal region consistent with bowel obstruction. No free air or portal venous air is evident. Vascular/Lymphatic: There is aortic and iliac artery atherosclerosis. No aneurysm evident. Major mesenteric arterial vessels appear patent. No adenopathy is appreciable in the abdomen or pelvis. Reproductive: Uterus is absent.  No pelvic mass evident. Other: Soft tissue thickening in the posterior portion of pelvis is likely of postoperative etiology. No abscess or ascites is evident in the abdomen or pelvis. Musculoskeletal: There is stable 3 mm of anterolisthesis of L4 on L5, felt to be due to underlying spondylosis. There are no blastic or lytic bone lesions. No intramuscular lesions are evident. IMPRESSION: 1. There is transition zone consistent with bowel obstruction near the jejunoileal junction. No appreciable free air. 2. 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 No bowel  obstruction in the region of this hernia. 3. 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 Recommend follow up pre and post contrast MRI/MRCP or pancreatic protocol CT in 2 years. This recommendation follows ACR consensus guidelines: Management of Incidental Pancreatic Cysts: A White Paper of the ACR Incidental Findings Committee. [place] 8144;[id number]. 4.  Hepatic steatosis. 5.  Aortoiliac atherosclerosis. 6.  Stable mild spondylolisthesis at L4-5. 7. Chronic right renal atrophy. No renal or ureteral calculus. No hydronephrosis. Urinary bladder wall thickness within normal limits. Electronically Signed   By: Lowella Grip III M.D.   On: 05/28/2018 20:24    EKG: Independently reviewed.  Assessment/Plan Principal Problem:   SBO (small bowel obstruction) (HCC) Active Problems:   Benign hypertension with CKD (chronic kidney disease) stage III (HCC)   CKD (chronic kidney disease) stage 3, GFR 30-59 ml/min (HCC)   Type 2 diabetes mellitus with diabetic chronic kidney disease (HCC)   Parastomal hernia without obstruction or gangrene    1. SBO - 1. NPO 2. IVF: NS at 100 3. NGT 4. Gen surg consulted 5. Small bowel protocol 6. Strict intake and output 7. Repeat CBC/BMP in AM 2. Parastomal hernia - 1. Chronic and baseline 2. Not the site of obstruction 3. DM2 - 1. Hold home hypoglycemics 2. Sensitive SSI Q4H 4. HTN - 1. Continue home meds once med rec complete 2. Except will hold lasix and HCTZ 5. CKD - 1. Chronic and stable  DVT prophylaxis: Lovenox Code Status: Full Family Communication: No family in room Disposition Plan: Home after admit Consults called: Gen surg Admission status: Admit to inpatient  Severity of Illness: The appropriate patient status for this patient is INPATIENT. Inpatient status is judged to be reasonable and necessary in order to provide the required intensity of service to ensure the patient's safety. The  patient's presenting symptoms, physical exam findings, and initial radiographic and laboratory data in the context of their chronic comorbidities is felt to place them at high risk for further clinical deterioration. Furthermore, it is not anticipated that the patient will be medically stable  for discharge from the hospital within 2 midnights of admission. The following factors support the patient status of inpatient.   IP status for treatment of SBO with NGT.   * I certify that at the point of admission it is my clinical judgment that the patient will require inpatient hospital care spanning beyond 2 midnights from the point of admission due to high intensity of service, high risk for further deterioration and high frequency of surveillance required.*    Jacayla Nordell M. DO [facility]  How to contact the [facility] Attending or Consulting provider [place] or covering provider during after hours [place], for this patient?  1. Check the care team in [facility] and look for a) attending/consulting [facility] provider listed and b) the [facility] team listed 2. Log into [email or web address]  Amion Physician Scheduling and messaging for groups and whole hospitals  On call and physician scheduling software for group practices, residents, hospitalists and other medical providers for call, clinic, rotation and shift schedules. On[employer] is a hospital-wide system for scheduling doctors and paging doctors on call. EasyPlot is for scientific plotting and data analysis.  [email or web address]  and use [place]'s universal password to access. If you do not have the password, please contact the hospital operator.  3. Locate the [facility] provider you are looking for under [facility] and page to a number that you can be directly reached. 4. If you still have difficulty reaching the provider, please page the [facility] (Director on Call) for the Hospitalists listed on amion for assistance.  05/28/2018, 9:51 PM

## 2018-05-28 NOTE — Telephone Encounter (Signed)
I spoke with the patient several hours ago.  She is having abdominal pain and cramping.  She feels that she may have a "blockage".  She is not able to tolerate a liquid or solid food diet.  She reports pain started last night and getting progressively worse. She is advised that she should proceed to the ED for evaluation.

## 2018-05-28 NOTE — ED Triage Notes (Signed)
Has hx of ileostomy now she has nausea and vomiting abd abd pain thinks she have a blockage that is causing her pain vomiting had blood in it

## 2018-05-28 NOTE — ED Notes (Signed)
Nurse Navigator communication: Daughter of the patient given an update, pt remains in the ER waiting room and should go back soon. The patient was able to speak with her daughter on the phone.

## 2018-05-29 ENCOUNTER — Other Ambulatory Visit: Payer: Self-pay

## 2018-05-29 ENCOUNTER — Inpatient Hospital Stay (HOSPITAL_COMMUNITY): Payer: Medicare Other

## 2018-05-29 LAB — GLUCOSE, CAPILLARY
Glucose-Capillary: 110 mg/dL — ABNORMAL HIGH (ref 70–99)
Glucose-Capillary: 123 mg/dL — ABNORMAL HIGH (ref 70–99)
Glucose-Capillary: 127 mg/dL — ABNORMAL HIGH (ref 70–99)
Glucose-Capillary: 131 mg/dL — ABNORMAL HIGH (ref 70–99)
Glucose-Capillary: 134 mg/dL — ABNORMAL HIGH (ref 70–99)
Glucose-Capillary: 139 mg/dL — ABNORMAL HIGH (ref 70–99)
Glucose-Capillary: 153 mg/dL — ABNORMAL HIGH (ref 70–99)
Glucose-Capillary: 52 mg/dL — ABNORMAL LOW (ref 70–99)
Glucose-Capillary: 63 mg/dL — ABNORMAL LOW (ref 70–99)

## 2018-05-29 LAB — CBC
HCT: 33.9 % — ABNORMAL LOW (ref 36.0–46.0)
Hemoglobin: 11.1 g/dL — ABNORMAL LOW (ref 12.0–15.0)
MCH: 27.7 pg (ref 26.0–34.0)
MCHC: 32.7 g/dL (ref 30.0–36.0)
MCV: 84.5 fL (ref 80.0–100.0)
Platelets: 180 10*3/uL (ref 150–400)
RBC: 4.01 MIL/uL (ref 3.87–5.11)
RDW: 15.6 % — ABNORMAL HIGH (ref 11.5–15.5)
WBC: 5.5 10*3/uL (ref 4.0–10.5)
nRBC: 0 % (ref 0.0–0.2)

## 2018-05-29 LAB — BASIC METABOLIC PANEL
Anion gap: 11 (ref 5–15)
BUN: 28 mg/dL — ABNORMAL HIGH (ref 8–23)
CO2: 21 mmol/L — ABNORMAL LOW (ref 22–32)
Calcium: 8.8 mg/dL — ABNORMAL LOW (ref 8.9–10.3)
Chloride: 106 mmol/L (ref 98–111)
Creatinine, Ser: 1.47 mg/dL — ABNORMAL HIGH (ref 0.44–1.00)
GFR calc Af Amer: 39 mL/min — ABNORMAL LOW (ref >60)
GFR calc non Af Amer: 34 mL/min — ABNORMAL LOW (ref >60)
Glucose, Bld: 126 mg/dL — ABNORMAL HIGH (ref 70–99)
Potassium: 3.8 mmol/L (ref 3.5–5.1)
Sodium: 138 mmol/L (ref 135–145)

## 2018-05-29 MED ORDER — DEXTROSE 50 % IV SOLN
INTRAVENOUS | Status: AC
  Administered 2018-05-29: 17:00:00 25 mL
  Filled 2018-05-29: qty 50

## 2018-05-29 MED ORDER — MORPHINE SULFATE (PF) 2 MG/ML IV SOLN: 2.0000 mg | INTRAVENOUS | Status: DC | PRN

## 2018-05-29 MED ORDER — PROMETHAZINE HCL 25 MG/ML IJ SOLN
12.5000 mg | Freq: Four times a day (QID) | INTRAMUSCULAR | Status: DC | PRN
  Administered 2018-05-29: 12.5 mg via INTRAVENOUS
  Filled 2018-05-29: qty 1

## 2018-05-29 MED ORDER — DEXTROSE-NACL 5-0.9 % IV SOLN
INTRAVENOUS | Status: DC
  Administered 2018-05-29: 23:00:00 via INTRAVENOUS

## 2018-05-29 MED ORDER — DEXTROSE 50 % IV SOLN
INTRAVENOUS | Status: AC
  Administered 2018-05-29: 21:00:00 25 mL
  Filled 2018-05-29: qty 50

## 2018-05-29 NOTE — Progress Notes (Signed)
PROGRESS NOTE    ABENA ERDMAN  HQI:[id number] DOB: [birth year] DOA: 05/28/2018 PCP: Thornton Dales I, MD  Brief Narrative: 78 year old female with history of ulcerative colitis status post remote total colectomy 30 years ago, recurrent SBO's, type 2 diabetes mellitus, hypertension, presented to the emergency room with increasing crampy abdominal pain, nausea of vomiting and abdominal distention x2 days. -CT abdomen pelvis showed small bowel obstruction   Assessment & Plan:   Partial small bowel obstruction -Remote history of total colectomy for UC with ileostomy 30 years ago, history of ileostomy revision -General surgery following -Currently having ileostomy output however persistent SBO on imaging and symptomatic as well -NG to intermittent low wall suction -N.p.o., IV fluids, small bowel protocol per general surgery  Type 2 diabetes mellitus -Oral hypoglycemics on hold, continue sliding scale insulin  Hypertension -Oral meds held including diuretics -IV hydralazine as needed  Chronic kidney disease stage III -Stable at baseline, 1.3-1.4 range  History of ulcerative colitis -Remote total colectomy and ileostomy -See above  DVT prophylaxis: Lovenox Code Status: Full code Family Communication: No family at bedside Disposition Plan: Home pending improvement  Consultants:   General surgery   Procedures:   Antimicrobials:    Subjective: -Continues to be very uncomfortable, having ileostomy output but very tight and distended, vomited x1 and remains nauseous  Objective: Vitals:   05/28/18 2130 05/28/18 2145 05/28/18 2224 05/29/18 0416  BP: 138/60 135/60 (!) 134/52 (!) 124/58  Pulse: 66 68 64 72  Resp:   13 18  Temp:   98.5 F (36.9 C) 99 F (37.2 C)  TempSrc:   Oral Oral  SpO2: 97% 97% 99% 98%    Intake/Output Summary (Last 24 hours) at 05/29/2018 1517 Last data filed at 05/29/2018 1500 Gross per 24 hour  Intake 966.59 ml  Output 1770 ml  Net -803.41  ml   There were no vitals filed for this visit.  Examination: General exam: Likely ill appearing female, laying in bed, uncomfortable appearing, alert awake oriented x2   Respiratory system: Decreased breath sounds at both bases. Cardiovascular system: S1 & S2 heard, RRR. Gastrointestinal system: Soft, mildly distended, ileostomy with brown stool, bowel sounds decreased Central nervous system: Alert and oriented. No focal neurological deficits. Extremities: Symmetric 5 x 5 power. Skin: No rashes, lesions or ulcers Psychiatry: Judgement and insight appear normal. Mood & affect appropriate.     Data Reviewed:   CBC: Recent Labs  Lab 05/28/18 1307 05/29/18 0239  WBC 9.5 5.5  HGB 12.5 11.1*  HCT 38.7 33.9*  MCV 86.2 84.5  PLT 198 132   Basic Metabolic Panel: Recent Labs  Lab 05/28/18 1307 05/29/18 0239  NA 138 138  K 4.0 3.8  CL 106 106  CO2 19* 21*  GLUCOSE 184* 126*  BUN 25* 28*  CREATININE 1.43* 1.47*  CALCIUM 9.4 8.8*   GFR: CrCl cannot be calculated (Unknown ideal weight.). Liver Function Tests: Recent Labs  Lab 05/28/18 1307  AST 17  ALT 14  ALKPHOS 84  BILITOT 1.0  PROT 7.0  ALBUMIN 3.5   Recent Labs  Lab 05/28/18 1307  LIPASE 40   No results for input(s): AMMONIA in the last 168 hours. Coagulation Profile: No results for input(s): INR, PROTIME in the last 168 hours. Cardiac Enzymes: No results for input(s): CKTOTAL, CKMB, CKMBINDEX, TROPONINI in the last 168 hours. BNP (last 3 results) No results for input(s): PROBNP in the last 8760 hours. HbA1C: No results for input(s): HGBA1C in the last 72  hours. CBG: Recent Labs  Lab 05/28/18 2137 05/29/18 0021 05/29/18 0411 05/29/18 0815 05/29/18 1129  GLUCAP 114* 110* 131* 127* 123*   Lipid Profile: No results for input(s): CHOL, HDL, LDLCALC, TRIG, CHOLHDL, LDLDIRECT in the last 72 hours. Thyroid Function Tests: No results for input(s): TSH, T4TOTAL, FREET4, T3FREE, THYROIDAB in the last  72 hours. Anemia Panel: No results for input(s): VITAMINB12, FOLATE, FERRITIN, TIBC, IRON, RETICCTPCT in the last 72 hours. Urine analysis:    Component Value Date/Time   COLORURINE YELLOW 05/28/2018 2038   APPEARANCEUR HAZY (A) 05/28/2018 2038   LABSPEC 1.035 (H) 05/28/2018 2038   PHURINE 5.0 05/28/2018 2038   GLUCOSEU NEGATIVE 05/28/2018 2038   HGBUR NEGATIVE 05/28/2018 2038   BILIRUBINUR NEGATIVE 05/28/2018 2038   [place] NEGATIVE 05/28/2018 2038   PROTEINUR NEGATIVE 05/28/2018 2038   NITRITE NEGATIVE 05/28/2018 2038   LEUKOCYTESUR MODERATE (A) 05/28/2018 2038   Sepsis Labs: @LABRCNTIP (procalcitonin:4,lacticidven:4)  ) Recent Results (from the past 240 hour(s))  SARS Coronavirus 2 (CEPHEID - Performed in [place] hospital lab), Hosp Order     Status: None   Collection Time: 05/28/18  9:43 PM  Result Value Ref Range Status   SARS Coronavirus 2 NEGATIVE NEGATIVE Final    Comment: (NOTE) If result is NEGATIVE SARS-CoV-2 target nucleic acids are NOT DETECTED. The SARS-CoV-2 RNA is generally detectable in upper and lower  respiratory specimens during the acute phase of infection. The lowest  concentration of SARS-CoV-2 viral copies this assay can detect is 250  copies / mL. A negative result does not preclude SARS-CoV-2 infection  and should not be used as the sole basis for treatment or other  patient management decisions.  A negative result may occur with  improper specimen collection / handling, submission of specimen other  than nasopharyngeal swab, presence of viral mutation(s) within the  areas targeted by this assay, and inadequate number of viral copies  (<250 copies / mL). A negative result must be combined with clinical  observations, patient history, and epidemiological information. If result is POSITIVE SARS-CoV-2 target nucleic acids are DETECTED. The SARS-CoV-2 RNA is generally detectable in upper and lower  respiratory specimens dur ing the acute phase  of infection.  Positive  results are indicative of active infection with SARS-CoV-2.  Clinical  correlation with patient history and other diagnostic information is  necessary to determine patient infection status.  Positive results do  not rule out bacterial infection or co-infection with other viruses. If result is PRESUMPTIVE POSTIVE SARS-CoV-2 nucleic acids MAY BE PRESENT.   A presumptive positive result was obtained on the submitted specimen  and confirmed on repeat testing.  While 2019 novel coronavirus  (SARS-CoV-2) nucleic acids may be present in the submitted sample  additional confirmatory testing may be necessary for epidemiological  and / or clinical management purposes  to differentiate between  SARS-CoV-2 and other Sarbecovirus currently known to infect humans.  If clinically indicated additional testing with an alternate test  methodology [phone]) is advised. The SARS-CoV-2 RNA is generally  detectable in upper and lower respiratory sp ecimens during the acute  phase of infection. The expected result is Negative. Fact Sheet for Patients:  StrictlyIdeas.no Fact Sheet for Healthcare Providers: BankingDealers.co.za This test is not yet approved or cleared by the [place] FDA and has been authorized for detection and/or diagnosis of SARS-CoV-2 by FDA under an Emergency Use Authorization (EUA).  This EUA will remain in effect (meaning this test can be used) for the duration of  the COVID-19 declaration under Section 564(b)(1) of the Act, 21 U.S.C. section 360bbb-3(b)(1), unless the authorization is terminated or revoked sooner. Performed at [facility], [place] [address], [place], [place] [address]          Radiology Studies: Ct Abdomen Pelvis W Contrast  Result Date: 05/28/2018 CLINICAL DATA:  Abdominal pain with nausea and vomiting EXAM: CT ABDOMEN AND PELVIS WITH CONTRAST TECHNIQUE: Multidetector CT imaging  of the abdomen and pelvis was performed using the standard protocol following bolus administration of intravenous contrast. CONTRAST:  164mL OMNIPAQUE IOHEXOL 300 MG/ML  SOLN COMPARISON:  November 15, 2017 FINDINGS: Lower chest: There is mild scarring in the lung bases. There is no lung base edema or consolidation. Hepatobiliary: There is hepatic steatosis. No focal liver lesions are demonstrable. The gallbladder wall is not appreciably thickened. There is no appreciable biliary duct dilatation. Pancreas: There remains a 6 x 5 mm focus of decreased attenuation in the uncinate process of the pancreas. No other pancreatic mass evident. There is no appreciable pancreatic duct dilatation. No peripancreatic inflammation. Spleen: No splenic lesions are evident. Adrenals/Urinary Tract: Adrenals appear normal bilaterally. Right kidney is atrophic with marked renal cortical thinning. There is a 7 mm cyst arising from the periphery the lower pole of the left kidney. There is no evident renal mass or hydronephrosis on either side. There is no renal or ureteral calculus on either side. Urinary bladder is midline with wall thickness within normal limits. Stomach/Bowel: There is again noted evidence of previous colectomy with left lower quadrant ostomy. 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 There is proximal small bowel dilatation with a transition zone in the distal jejunal region consistent with bowel obstruction. No free air or portal venous air is evident. Vascular/Lymphatic: There is aortic and iliac artery atherosclerosis. No aneurysm evident. Major mesenteric arterial vessels appear patent. No adenopathy is appreciable in the abdomen or pelvis. Reproductive: Uterus is absent.  No pelvic mass evident. Other: Soft tissue thickening in the posterior portion of pelvis is likely of postoperative etiology. No abscess or ascites is evident in the abdomen or pelvis. Musculoskeletal:  There is stable 3 mm of anterolisthesis of L4 on L5, felt to be due to underlying spondylosis. There are no blastic or lytic bone lesions. No intramuscular lesions are evident. IMPRESSION: 1. There is transition zone consistent with bowel obstruction near the jejunoileal junction. No appreciable free air. 2. 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 No bowel obstruction in the region of this hernia. 3. 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 Recommend follow up pre and post contrast MRI/MRCP or pancreatic protocol CT in 2 years. This recommendation follows ACR consensus guidelines: Management of Incidental Pancreatic Cysts: A White Paper of the ACR Incidental Findings Committee. [place] 7412;87:[id number]. 4.  Hepatic steatosis. 5.  Aortoiliac atherosclerosis. 6.  Stable mild spondylolisthesis at L4-5. 7. Chronic right renal atrophy. No renal or ureteral calculus. No hydronephrosis. Urinary bladder wall thickness within normal limits. Electronically Signed   By: Lowella Grip III M.D.   On: 05/28/2018 20:24   Dg Abd Portable 1v-small Bowel Obstruction Protocol-initial, 8 Hr Delay  Result Date: 05/29/2018 CLINICAL DATA:  Small bowel obstruction EXAM: PORTABLE ABDOMEN - 1 VIEW COMPARISON:  05/28/2018 FINDINGS: NG tube remains in the stomach, unchanged. Dilated small bowel loops in  the lower abdomen and pelvis again noted, unchanged. No organomegaly or free air. IMPRESSION: Continued dilated small bowel loops.  No significant change. Electronically Signed   By: Rolm Baptise M.D.   On: 05/29/2018 08:17   Dg Abd Portable 1v-small Bowel Protocol-position Verification  Result Date: 05/28/2018 CLINICAL DATA:  Initial evaluation for NG tube placement EXAM: PORTABLE ABDOMEN - 1 VIEW COMPARISON:  Prior CT from 05/28/2018 FINDINGS: Enteric tube in place with tip overlying the  gastric body, side hole well beyond the GE junction. Tip projects inferiorly. Persistent dilated loop of small bowel within the lower abdomen measures up to 4.2 cm, compatible with ongoing small bowel obstruction. Contrast material seen within the renal collecting system and bladder. Visualized osseous structures within normal limits. IMPRESSION: 1. Tip of enteric tube overlying the gastric body, side hole well beyond the GE junction. Tip projects inferiorly. 2. Persistent dilated loop of small bowel within the lower abdomen, compatible with ongoing small bowel obstruction. Electronically Signed   By: Jeannine Boga M.D.   On: 05/28/2018 23:24        Scheduled Meds: . enoxaparin (LOVENOX) injection  40 mg Subcutaneous Q24H  . insulin aspart  0-9 Units Subcutaneous Q4H  . sodium chloride flush  3 mL Intravenous Once   Continuous Infusions: . sodium chloride 100 mL/hr at 05/29/18 0953     LOS: 1 day    Time spent: 14min    Domenic Polite, MD [facility]  05/29/2018, 3:17 PM

## 2018-05-29 NOTE — Progress Notes (Signed)
[facility] Progress Note     Subjective: CC: nausea Patient vomiting around NGT. Reports R sided abdominal pain. Some stool and flatus in ileostomy bag.   Objective: Vital signs in last 24 hours: Temp:  [97.8 F (36.6 C)-99.3 F (37.4 C)] 99 F (37.2 C) (05/27 0416) Pulse Rate:  [64-77] 72 (05/27 0416) Resp:  [13-18] 18 (05/27 0416) BP: (118-172)/(52-78) 124/58 (05/27 0416) SpO2:  [72 %-100 %] 98 % (05/27 0416) Last BM Date: 05/28/18(thru ostomy bag)  Intake/Output from previous day: 05/26 0701 - 05/27 0700 In: 966.6 [I.V.:846.6; NG/GT:120] Out: 420 [Urine:150; Emesis/NG output:150; Stool:120] Intake/Output this shift: No intake/output data recorded.  PE: Gen:  Alert, NAD, pleasant Card:  Regular rate and rhythm Pulm:  Normal effort, clear to auscultation bilaterally Abd: Soft, TTP in central abdomen, distended, BS hypoactive, ileostomy in LLQ with stool and gas in pouch Skin: warm and dry, no rashes  Psych: A&Ox3   Lab Results:  Recent Labs    05/28/18 1307 05/29/18 0239  WBC 9.5 5.5  HGB 12.5 11.1*  HCT 38.7 33.9*  PLT 198 180   BMET Recent Labs    05/28/18 1307 05/29/18 0239  NA 138 138  K 4.0 3.8  CL 106 106  CO2 19* 21*  GLUCOSE 184* 126*  BUN 25* 28*  CREATININE 1.43* 1.47*  CALCIUM 9.4 8.8*   PT/INR No results for input(s): LABPROT, INR in the last 72 hours. CMP     Component Value Date/Time   NA 138 05/29/2018 0239   K 3.8 05/29/2018 0239   CL 106 05/29/2018 0239   CO2 21 (L) 05/29/2018 0239   GLUCOSE 126 (H) 05/29/2018 0239   BUN 28 (H) 05/29/2018 0239   CREATININE 1.47 (H) 05/29/2018 0239   CREATININE 1.46 (H) 05/31/2017 1538   CALCIUM 8.8 (L) 05/29/2018 0239   PROT 7.0 05/28/2018 1307   ALBUMIN 3.5 05/28/2018 1307   AST 17 05/28/2018 1307   ALT 14 05/28/2018 1307   ALKPHOS 84 05/28/2018 1307   BILITOT 1.0 05/28/2018 1307   GFRNONAA 34 (L) 05/29/2018 0239   GFRAA 39 (L) 05/29/2018 0239   Lipase     Component  Value Date/Time   LIPASE 40 05/28/2018 1307       Studies/Results: Ct Abdomen Pelvis W Contrast  Result Date: 05/28/2018 CLINICAL DATA:  Abdominal pain with nausea and vomiting EXAM: CT ABDOMEN AND PELVIS WITH CONTRAST TECHNIQUE: Multidetector CT imaging of the abdomen and pelvis was performed using the standard protocol following bolus administration of intravenous contrast. CONTRAST:  163mL OMNIPAQUE IOHEXOL 300 MG/ML  SOLN COMPARISON:  November 15, 2017 FINDINGS: Lower chest: There is mild scarring in the lung bases. There is no lung base edema or consolidation. Hepatobiliary: There is hepatic steatosis. No focal liver lesions are demonstrable. The gallbladder wall is not appreciably thickened. There is no appreciable biliary duct dilatation. Pancreas: There remains a 6 x 5 mm focus of decreased attenuation in the uncinate process of the pancreas. No other pancreatic mass evident. There is no appreciable pancreatic duct dilatation. No peripancreatic inflammation. Spleen: No splenic lesions are evident. Adrenals/Urinary Tract: Adrenals appear normal bilaterally. Right kidney is atrophic with marked renal cortical thinning. There is a 7 mm cyst arising from the periphery the lower pole of the left kidney. There is no evident renal mass or hydronephrosis on either side. There is no renal or ureteral calculus on either side. Urinary bladder is midline with wall thickness within normal limits. Stomach/Bowel: There is  again noted evidence of previous colectomy with left lower quadrant ostomy. 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 There is proximal small bowel dilatation with a transition zone in the distal jejunal region consistent with bowel obstruction. No free air or portal venous air is evident. Vascular/Lymphatic: There is aortic and iliac artery atherosclerosis. No aneurysm evident. Major mesenteric arterial vessels appear patent. No adenopathy is  appreciable in the abdomen or pelvis. Reproductive: Uterus is absent.  No pelvic mass evident. Other: Soft tissue thickening in the posterior portion of pelvis is likely of postoperative etiology. No abscess or ascites is evident in the abdomen or pelvis. Musculoskeletal: There is stable 3 mm of anterolisthesis of L4 on L5, felt to be due to underlying spondylosis. There are no blastic or lytic bone lesions. No intramuscular lesions are evident. IMPRESSION: 1. There is transition zone consistent with bowel obstruction near the jejunoileal junction. No appreciable free air. 2. 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 No bowel obstruction in the region of this hernia. 3. 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 Recommend follow up pre and post contrast MRI/MRCP or pancreatic protocol CT in 2 years. This recommendation follows ACR consensus guidelines: Management of Incidental Pancreatic Cysts: A White Paper of the ACR Incidental Findings Committee. [place] 5329;92:[id number]. 4.  Hepatic steatosis. 5.  Aortoiliac atherosclerosis. 6.  Stable mild spondylolisthesis at L4-5. 7. Chronic right renal atrophy. No renal or ureteral calculus. No hydronephrosis. Urinary bladder wall thickness within normal limits. Electronically Signed   By: Lowella Grip III M.D.   On: 05/28/2018 20:24   Dg Abd Portable 1v-small Bowel Obstruction Protocol-initial, 8 Hr Delay  Result Date: 05/29/2018 CLINICAL DATA:  Small bowel obstruction EXAM: PORTABLE ABDOMEN - 1 VIEW COMPARISON:  05/28/2018 FINDINGS: NG tube remains in the stomach, unchanged. Dilated small bowel loops in the lower abdomen and pelvis again noted, unchanged. No organomegaly or free air. IMPRESSION: Continued dilated small bowel loops.  No significant change. Electronically Signed   By: Rolm Baptise M.D.   On: 05/29/2018 08:17   Dg  Abd Portable 1v-small Bowel Protocol-position Verification  Result Date: 05/28/2018 CLINICAL DATA:  Initial evaluation for NG tube placement EXAM: PORTABLE ABDOMEN - 1 VIEW COMPARISON:  Prior CT from 05/28/2018 FINDINGS: Enteric tube in place with tip overlying the gastric body, side hole well beyond the GE junction. Tip projects inferiorly. Persistent dilated loop of small bowel within the lower abdomen measures up to 4.2 cm, compatible with ongoing small bowel obstruction. Contrast material seen within the renal collecting system and bladder. Visualized osseous structures within normal limits. IMPRESSION: 1. Tip of enteric tube overlying the gastric body, side hole well beyond the GE junction. Tip projects inferiorly. 2. Persistent dilated loop of small bowel within the lower abdomen, compatible with ongoing small bowel obstruction. Electronically Signed   By: Jeannine Boga M.D.   On: 05/28/2018 23:24    Anti-infectives: Anti-infectives (From admission, onward)   None       Assessment/Plan HTN T2DM CKD CHF Hx of gastric ulcers  Hx of total abdominal colectomy for UC > 30 years ago with ileostomy Hx of ileostomy revision  PSBO - film this AM with continued sbo - repeat film at 24 hrs - mobilize as tolerated - prn antiemetics  FEN: NPO, IVF; NGT to LIWS VTE: SCDs,  lovenox ID: no abx indicated  LOS: 1 day    Brigid Re , [facility] 05/29/2018, 8:24 AM Pager: [phone] Consults: [phone]

## 2018-05-29 NOTE — Progress Notes (Signed)
Initial output of NGT is 50 mL clear liquid, NGT then clamped at 0005 to administer the 90 ml gastrografin which was completed at 0010 and NGT to remain clamped for 2 hrs, will notify Xray for the 8-hr delay of DG Abd port per SBO protocol.  Patient now resting on bed comfortably with both eyes closed.

## 2018-05-29 NOTE — Progress Notes (Signed)
Hypoglycemic Event  CBG: 53  Treatment: D50; 25ML  Symptoms: None  Follow-up CBG: Time: 1710 CBG Result:153  Possible Reasons for Event: NPO  Comments/MD notified: Yes    Darleen Crocker RN

## 2018-05-30 ENCOUNTER — Inpatient Hospital Stay (HOSPITAL_COMMUNITY): Payer: Medicare Other

## 2018-05-30 LAB — BASIC METABOLIC PANEL
Anion gap: 11 (ref 5–15)
BUN: 33 mg/dL — ABNORMAL HIGH (ref 8–23)
CO2: 20 mmol/L — ABNORMAL LOW (ref 22–32)
Calcium: 8.5 mg/dL — ABNORMAL LOW (ref 8.9–10.3)
Chloride: 111 mmol/L (ref 98–111)
Creatinine, Ser: 1.64 mg/dL — ABNORMAL HIGH (ref 0.44–1.00)
GFR calc Af Amer: 35 mL/min — ABNORMAL LOW (ref >60)
GFR calc non Af Amer: 30 mL/min — ABNORMAL LOW (ref >60)
Glucose, Bld: 106 mg/dL — ABNORMAL HIGH (ref 70–99)
Potassium: 3.1 mmol/L — ABNORMAL LOW (ref 3.5–5.1)
Sodium: 142 mmol/L (ref 135–145)

## 2018-05-30 LAB — GLUCOSE, CAPILLARY
Glucose-Capillary: 101 mg/dL — ABNORMAL HIGH (ref 70–99)
Glucose-Capillary: 105 mg/dL — ABNORMAL HIGH (ref 70–99)
Glucose-Capillary: 108 mg/dL — ABNORMAL HIGH (ref 70–99)
Glucose-Capillary: 117 mg/dL — ABNORMAL HIGH (ref 70–99)
Glucose-Capillary: 122 mg/dL — ABNORMAL HIGH (ref 70–99)
Glucose-Capillary: 128 mg/dL — ABNORMAL HIGH (ref 70–99)
Glucose-Capillary: 94 mg/dL (ref 70–99)

## 2018-05-30 LAB — CBC
HCT: 30.7 % — ABNORMAL LOW (ref 36.0–46.0)
Hemoglobin: 10.1 g/dL — ABNORMAL LOW (ref 12.0–15.0)
MCH: 28.2 pg (ref 26.0–34.0)
MCHC: 32.9 g/dL (ref 30.0–36.0)
MCV: 85.8 fL (ref 80.0–100.0)
Platelets: 158 10*3/uL (ref 150–400)
RBC: 3.58 MIL/uL — ABNORMAL LOW (ref 3.87–5.11)
RDW: 15.6 % — ABNORMAL HIGH (ref 11.5–15.5)
WBC: 5.3 10*3/uL (ref 4.0–10.5)
nRBC: 0 % (ref 0.0–0.2)

## 2018-05-30 MED ORDER — KCL IN DEXTROSE-NACL 40-5-0.45 MEQ/L-%-% IV SOLN
INTRAVENOUS | Status: DC
  Administered 2018-05-30 – 2018-05-31 (×2): via INTRAVENOUS
  Filled 2018-05-30 (×2): qty 1000

## 2018-05-30 MED ORDER — POTASSIUM CHLORIDE 20 MEQ/15ML (10%) PO SOLN
20.0000 meq | Freq: Three times a day (TID) | ORAL | Status: AC
  Administered 2018-05-30: 17:00:00 20 meq via ORAL
  Filled 2018-05-30: qty 15

## 2018-05-30 MED ORDER — PHENOL 1.4 % MT LIQD: 1.0000 | OROMUCOSAL | Status: DC | PRN

## 2018-05-30 MED ORDER — KCL IN DEXTROSE-NACL 40-5-0.45 MEQ/L-%-% IV SOLN
INTRAVENOUS | Status: DC
  Administered 2018-05-30: 09:00:00 via INTRAVENOUS
  Filled 2018-05-30: qty 1000

## 2018-05-30 MED ORDER — POTASSIUM CHLORIDE 20 MEQ/15ML (10%) PO SOLN
20.0000 meq | Freq: Three times a day (TID) | ORAL | Status: DC
  Administered 2018-05-30: 20 meq via ORAL
  Filled 2018-05-30: qty 15

## 2018-05-30 MED ORDER — DOCUSATE SODIUM 100 MG PO CAPS
100.0000 mg | ORAL_CAPSULE | Freq: Two times a day (BID) | ORAL | Status: DC
  Administered 2018-06-01 – 2018-06-02 (×3): 100 mg via ORAL
  Filled 2018-05-30 (×6): qty 1

## 2018-05-30 MED ORDER — POTASSIUM CHLORIDE 10 MEQ/100ML IV SOLN
10.0000 meq | INTRAVENOUS | Status: AC
  Administered 2018-05-30 (×2): 10 meq via INTRAVENOUS
  Filled 2018-05-30 (×2): qty 100

## 2018-05-30 MED ORDER — MENTHOL 3 MG MT LOZG: 1.0000 | LOZENGE | OROMUCOSAL | Status: DC | PRN

## 2018-05-30 MED ORDER — POTASSIUM CHLORIDE 10 MEQ/100ML IV SOLN: 10.0000 meq | INTRAVENOUS | Status: DC

## 2018-05-30 NOTE — Progress Notes (Signed)
The patient is tolerating clear liquids well.  NG was discontinued as ordered without any problems.  The patient will order clear liquid diet from dietary for dinner meal.  Good output with ileostomy.

## 2018-05-30 NOTE — Plan of Care (Addendum)
°  Problem: Coping: °Goal: Level of anxiety will decrease °Outcome: Progressing °  °

## 2018-05-30 NOTE — Progress Notes (Signed)
Hypoglycemic Event  CBG: 63  Treatment: D50 Solution IVP 46mL  Symptoms: None  Follow-up CBG: Time:2111 CBG Result:139  Possible Reasons for Event: NPO  Comments/MD notified:Yes    Loletta Parish, RN

## 2018-05-30 NOTE — Progress Notes (Signed)
Notified [facility] concerning pt blood sugar drops with  NG tube and being NPO. New order was placed for IV fluids.

## 2018-05-30 NOTE — Progress Notes (Signed)
[facility] Progress Note     Subjective: CC: sbo Patient feeling much better this AM. Had increased output from ileostomy overnight. Less distended and no pain this AM. Hoping to get NGT out later.   Objective: Vital signs in last 24 hours: Temp:  [98.5 F (36.9 C)-98.7 F (37.1 C)] 98.5 F (36.9 C) (05/28 0401) Pulse Rate:  [59-73] 59 (05/28 0401) Resp:  [12-18] 12 (05/28 0401) BP: (129-135)/(50-59) 135/59 (05/28 0401) SpO2:  [95 %-98 %] 95 % (05/28 0401) Last BM Date: 05/29/18  Intake/Output from previous day: 05/27 0701 - 05/28 0700 In: 314.6 [I.V.:314.6] Out: 2175 [Emesis/NG output:450; Stool:1725] Intake/Output this shift: Total I/O In: -  Out: 150 [Emesis/NG output:150]  PE: Gen:  Alert, NAD, pleasant Card:  Regular rate and rhythm Pulm:  Normal effort, clear to auscultation bilaterally Abd: Soft, non-tender, not distended, BS hypoactive, ileostomy in LLQ with stool and gas in pouch Skin: warm and dry, no rashes  Psych: A&Ox3   Lab Results:  Recent Labs    05/29/18 0239 05/30/18 0205  WBC 5.5 5.3  HGB 11.1* 10.1*  HCT 33.9* 30.7*  PLT 180 158   BMET Recent Labs    05/29/18 0239 05/30/18 0205  NA 138 142  K 3.8 3.1*  CL 106 111  CO2 21* 20*  GLUCOSE 126* 106*  BUN 28* 33*  CREATININE 1.47* 1.64*  CALCIUM 8.8* 8.5*   PT/INR No results for input(s): LABPROT, INR in the last 72 hours. CMP     Component Value Date/Time   NA 142 05/30/2018 0205   K 3.1 (L) 05/30/2018 0205   CL 111 05/30/2018 0205   CO2 20 (L) 05/30/2018 0205   GLUCOSE 106 (H) 05/30/2018 0205   BUN 33 (H) 05/30/2018 0205   CREATININE 1.64 (H) 05/30/2018 0205   CREATININE 1.46 (H) 05/31/2017 1538   CALCIUM 8.5 (L) 05/30/2018 0205   PROT 7.0 05/28/2018 1307   ALBUMIN 3.5 05/28/2018 1307   AST 17 05/28/2018 1307   ALT 14 05/28/2018 1307   ALKPHOS 84 05/28/2018 1307   BILITOT 1.0 05/28/2018 1307   GFRNONAA 30 (L) 05/30/2018 0205   GFRAA 35 (L) 05/30/2018 0205    Lipase     Component Value Date/Time   LIPASE 40 05/28/2018 1307       Studies/Results: Ct Abdomen Pelvis W Contrast  Result Date: 05/28/2018 CLINICAL DATA:  Abdominal pain with nausea and vomiting EXAM: CT ABDOMEN AND PELVIS WITH CONTRAST TECHNIQUE: Multidetector CT imaging of the abdomen and pelvis was performed using the standard protocol following bolus administration of intravenous contrast. CONTRAST:  186mL OMNIPAQUE IOHEXOL 300 MG/ML  SOLN COMPARISON:  November 15, 2017 FINDINGS: Lower chest: There is mild scarring in the lung bases. There is no lung base edema or consolidation. Hepatobiliary: There is hepatic steatosis. No focal liver lesions are demonstrable. The gallbladder wall is not appreciably thickened. There is no appreciable biliary duct dilatation. Pancreas: There remains a 6 x 5 mm focus of decreased attenuation in the uncinate process of the pancreas. No other pancreatic mass evident. There is no appreciable pancreatic duct dilatation. No peripancreatic inflammation. Spleen: No splenic lesions are evident. Adrenals/Urinary Tract: Adrenals appear normal bilaterally. Right kidney is atrophic with marked renal cortical thinning. There is a 7 mm cyst arising from the periphery the lower pole of the left kidney. There is no evident renal mass or hydronephrosis on either side. There is no renal or ureteral calculus on either side. Urinary bladder is midline  with wall thickness within normal limits. Stomach/Bowel: There is again noted evidence of previous colectomy with left lower quadrant ostomy. 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 There is proximal small bowel dilatation with a transition zone in the distal jejunal region consistent with bowel obstruction. No free air or portal venous air is evident. Vascular/Lymphatic: There is aortic and iliac artery atherosclerosis. No aneurysm evident. Major mesenteric arterial vessels appear  patent. No adenopathy is appreciable in the abdomen or pelvis. Reproductive: Uterus is absent.  No pelvic mass evident. Other: Soft tissue thickening in the posterior portion of pelvis is likely of postoperative etiology. No abscess or ascites is evident in the abdomen or pelvis. Musculoskeletal: There is stable 3 mm of anterolisthesis of L4 on L5, felt to be due to underlying spondylosis. There are no blastic or lytic bone lesions. No intramuscular lesions are evident. IMPRESSION: 1. There is transition zone consistent with bowel obstruction near the jejunoileal junction. No appreciable free air. 2. 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 No bowel obstruction in the region of this hernia. 3. 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 Recommend follow up pre and post contrast MRI/MRCP or pancreatic protocol CT in 2 years. This recommendation follows ACR consensus guidelines: Management of Incidental Pancreatic Cysts: A White Paper of the ACR Incidental Findings Committee. [place] 6761;95:[id number]. 4.  Hepatic steatosis. 5.  Aortoiliac atherosclerosis. 6.  Stable mild spondylolisthesis at L4-5. 7. Chronic right renal atrophy. No renal or ureteral calculus. No hydronephrosis. Urinary bladder wall thickness within normal limits. Electronically Signed   By: Lowella Grip III M.D.   On: 05/28/2018 20:24   Dg Abd Portable 1v  Result Date: 05/30/2018 CLINICAL DATA:  Small-bowel obstruction. EXAM: PORTABLE ABDOMEN - 1 VIEW COMPARISON:  05/29/2018. FINDINGS: NG tube noted with tip over the stomach. Distended loops of small bowel again noted. Small bowel wall thickening is noted suggesting small bowel wall edema. No free air. Contrast from prior CT noted in the bladder. Visceral atherosclerotic vascular calcification noted. No acute bony abnormality. IMPRESSION: NG tube noted with  tip over the stomach. Distended loops of small bowel again noted. Small bowel wall thickening is noted suggesting small bowel wall edema. Electronically Signed   By: Marcello Moores  Register   On: 05/30/2018 07:55   Dg Abd Portable 1v-small Bowel Obstruction Protocol-initial, 8 Hr Delay  Result Date: 05/29/2018 CLINICAL DATA:  Small bowel obstruction EXAM: PORTABLE ABDOMEN - 1 VIEW COMPARISON:  05/28/2018 FINDINGS: NG tube remains in the stomach, unchanged. Dilated small bowel loops in the lower abdomen and pelvis again noted, unchanged. No organomegaly or free air. IMPRESSION: Continued dilated small bowel loops.  No significant change. Electronically Signed   By: Rolm Baptise M.D.   On: 05/29/2018 08:17   Dg Abd Portable 1v-small Bowel Protocol-position Verification  Result Date: 05/28/2018 CLINICAL DATA:  Initial evaluation for NG tube placement EXAM: PORTABLE ABDOMEN - 1 VIEW COMPARISON:  Prior CT from 05/28/2018 FINDINGS: Enteric tube in place with tip overlying the gastric body, side hole well beyond the GE junction. Tip projects inferiorly. Persistent dilated loop of small bowel within the lower abdomen measures up to 4.2 cm, compatible with ongoing small bowel obstruction. Contrast material seen within the renal collecting system and bladder. Visualized osseous structures within normal limits. IMPRESSION: 1. Tip of enteric tube  overlying the gastric body, side hole well beyond the GE junction. Tip projects inferiorly. 2. Persistent dilated loop of small bowel within the lower abdomen, compatible with ongoing small bowel obstruction. Electronically Signed   By: Jeannine Boga M.D.   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

## 2018-05-30 NOTE — Progress Notes (Signed)
PROGRESS NOTE    Angelica French  NLZ:[id number] DOB: [birth year] DOA: 05/28/2018 PCP: Thornton Dales I, MD  Brief Narrative: 78 year old female with history of ulcerative colitis status post remote total colectomy 30 years ago, recurrent SBO's, type 2 diabetes mellitus, hypertension, presented to the emergency room with increasing crampy abdominal pain, nausea of vomiting and abdominal distention x2 days. -CT abdomen pelvis showed small bowel obstruction  Assessment & Plan:   Partial small bowel obstruction -Remote history of total colectomy for UC with ileostomy 30 years ago, history of ileostomy revision -General surgery following -NG tube clamped today, started clear liquids -Per [facility] -labs in a.m.  Hypokalemia -Ordered replacement  Type 2 diabetes mellitus -Oral hypoglycemics on hold, continue sliding scale insulin  Hypertension -Oral meds held including diuretics -IV hydralazine as needed  Chronic kidney disease stage III -Bump in creatinine, baseline is 1.3-1.4 range -Continue IV fluids today  History of ulcerative colitis -Remote total colectomy and ileostomy -See above  DVT prophylaxis: Lovenox Code Status: Full code Family Communication: No family at bedside Disposition Plan: Home pending improvement  Consultants:   General surgery   Procedures:   Antimicrobials:    Subjective: -Feels a little better today, having less abdominal discomfort, -Continues to have ileostomy output  Objective: Vitals:   05/29/18 1523 05/29/18 2011 05/30/18 0401 05/30/18 1408  BP: (!) 129/55 (!) 134/50 (!) 135/59 (!) 152/61  Pulse: 73 61 (!) 59 (!) 57  Resp: 16 18 12 12   Temp: 98.5 F (36.9 C) 98.7 F (37.1 C) 98.5 F (36.9 C) 98 F (36.7 C)  TempSrc: Oral Oral Oral Oral  SpO2: 98% 98% 95% 99%    Intake/Output Summary (Last 24 hours) at 05/30/2018 1511 Last data filed at 05/30/2018 1403 Gross per 24 hour  Intake 554.57 ml  Output 1245 ml  Net -690.43 ml    There were no vitals filed for this visit.  Examination: Gen: Chronically ill-appearing female, laying in bed, appears more comfortable today, HEENT: NG tube is clamped Lungs: Decreased breath sounds at both bases CVS: RRR,No Gallops,Rubs or new Murmurs Abd: Soft, mildly distended, ileostomy with liquid brown stool, bowel sounds decreased but present Extremities: No edema Skin: no new rashes Psychiatry: Judgement and insight appear normal. Mood & affect appropriate.     Data Reviewed:   CBC: Recent Labs  Lab 05/28/18 1307 05/29/18 0239 05/30/18 0205  WBC 9.5 5.5 5.3  HGB 12.5 11.1* 10.1*  HCT 38.7 33.9* 30.7*  MCV 86.2 84.5 85.8  PLT 198 180 902   Basic Metabolic Panel: Recent Labs  Lab 05/28/18 1307 05/29/18 0239 05/30/18 0205  NA 138 138 142  K 4.0 3.8 3.1*  CL 106 106 111  CO2 19* 21* 20*  GLUCOSE 184* 126* 106*  BUN 25* 28* 33*  CREATININE 1.43* 1.47* 1.64*  CALCIUM 9.4 8.8* 8.5*   GFR: CrCl cannot be calculated (Unknown ideal weight.). Liver Function Tests: Recent Labs  Lab 05/28/18 1307  AST 17  ALT 14  ALKPHOS 84  BILITOT 1.0  PROT 7.0  ALBUMIN 3.5   Recent Labs  Lab 05/28/18 1307  LIPASE 40   No results for input(s): AMMONIA in the last 168 hours. Coagulation Profile: No results for input(s): INR, PROTIME in the last 168 hours. Cardiac Enzymes: No results for input(s): CKTOTAL, CKMB, CKMBINDEX, TROPONINI in the last 168 hours. BNP (last 3 results) No results for input(s): PROBNP in the last 8760 hours. HbA1C: No results for input(s): HGBA1C in the last 72  hours. CBG: Recent Labs  Lab 05/29/18 2124 05/30/18 0008 05/30/18 0357 05/30/18 0757 05/30/18 1150  GLUCAP 139* 94 117* 122* 105*   Lipid Profile: No results for input(s): CHOL, HDL, LDLCALC, TRIG, CHOLHDL, LDLDIRECT in the last 72 hours. Thyroid Function Tests: No results for input(s): TSH, T4TOTAL, FREET4, T3FREE, THYROIDAB in the last 72 hours. Anemia Panel: No  results for input(s): VITAMINB12, FOLATE, FERRITIN, TIBC, IRON, RETICCTPCT in the last 72 hours. Urine analysis:    Component Value Date/Time   COLORURINE YELLOW 05/28/2018 2038   APPEARANCEUR HAZY (A) 05/28/2018 2038   LABSPEC 1.035 (H) 05/28/2018 2038   PHURINE 5.0 05/28/2018 2038   GLUCOSEU NEGATIVE 05/28/2018 2038   HGBUR NEGATIVE 05/28/2018 2038   BILIRUBINUR NEGATIVE 05/28/2018 2038   [place] NEGATIVE 05/28/2018 2038   PROTEINUR NEGATIVE 05/28/2018 2038   NITRITE NEGATIVE 05/28/2018 2038   LEUKOCYTESUR MODERATE (A) 05/28/2018 2038   Sepsis Labs: @LABRCNTIP (procalcitonin:4,lacticidven:4)  ) Recent Results (from the past 240 hour(s))  SARS Coronavirus 2 (CEPHEID - Performed in [place] hospital lab), Hosp Order     Status: None   Collection Time: 05/28/18  9:43 PM  Result Value Ref Range Status   SARS Coronavirus 2 NEGATIVE NEGATIVE Final    Comment: (NOTE) If result is NEGATIVE SARS-CoV-2 target nucleic acids are NOT DETECTED. The SARS-CoV-2 RNA is generally detectable in upper and lower  respiratory specimens during the acute phase of infection. The lowest  concentration of SARS-CoV-2 viral copies this assay can detect is 250  copies / mL. A negative result does not preclude SARS-CoV-2 infection  and should not be used as the sole basis for treatment or other  patient management decisions.  A negative result may occur with  improper specimen collection / handling, submission of specimen other  than nasopharyngeal swab, presence of viral mutation(s) within the  areas targeted by this assay, and inadequate number of viral copies  (<250 copies / mL). A negative result must be combined with clinical  observations, patient history, and epidemiological information. If result is POSITIVE SARS-CoV-2 target nucleic acids are DETECTED. The SARS-CoV-2 RNA is generally detectable in upper and lower  respiratory specimens dur ing the acute phase of infection.  Positive   results are indicative of active infection with SARS-CoV-2.  Clinical  correlation with patient history and other diagnostic information is  necessary to determine patient infection status.  Positive results do  not rule out bacterial infection or co-infection with other viruses. If result is PRESUMPTIVE POSTIVE SARS-CoV-2 nucleic acids MAY BE PRESENT.   A presumptive positive result was obtained on the submitted specimen  and confirmed on repeat testing.  While 2019 novel coronavirus  (SARS-CoV-2) nucleic acids may be present in the submitted sample  additional confirmatory testing may be necessary for epidemiological  and / or clinical management purposes  to differentiate between  SARS-CoV-2 and other Sarbecovirus currently known to infect humans.  If clinically indicated additional testing with an alternate test  methodology [phone]) is advised. The SARS-CoV-2 RNA is generally  detectable in upper and lower respiratory sp ecimens during the acute  phase of infection. The expected result is Negative. Fact Sheet for Patients:  StrictlyIdeas.no Fact Sheet for Healthcare Providers: BankingDealers.co.za This test is not yet approved or cleared by the [place] FDA and has been authorized for detection and/or diagnosis of SARS-CoV-2 by FDA under an Emergency Use Authorization (EUA).  This EUA will remain in effect (meaning this test can be used) for the duration of  the COVID-19 declaration under Section 564(b)(1) of the Act, 21 U.S.C. section 360bbb-3(b)(1), unless the authorization is terminated or revoked sooner. Performed at [facility], [place] [address] [place], [place] [address]          Radiology Studies: Ct Abdomen Pelvis W Contrast  Result Date: 05/28/2018 CLINICAL DATA:  Abdominal pain with nausea and vomiting EXAM: CT ABDOMEN AND PELVIS WITH CONTRAST TECHNIQUE: Multidetector CT imaging of the abdomen and pelvis  was performed using the standard protocol following bolus administration of intravenous contrast. CONTRAST:  169mL OMNIPAQUE IOHEXOL 300 MG/ML  SOLN COMPARISON:  November 15, 2017 FINDINGS: Lower chest: There is mild scarring in the lung bases. There is no lung base edema or consolidation. Hepatobiliary: There is hepatic steatosis. No focal liver lesions are demonstrable. The gallbladder wall is not appreciably thickened. There is no appreciable biliary duct dilatation. Pancreas: There remains a 6 x 5 mm focus of decreased attenuation in the uncinate process of the pancreas. No other pancreatic mass evident. There is no appreciable pancreatic duct dilatation. No peripancreatic inflammation. Spleen: No splenic lesions are evident. Adrenals/Urinary Tract: Adrenals appear normal bilaterally. Right kidney is atrophic with marked renal cortical thinning. There is a 7 mm cyst arising from the periphery the lower pole of the left kidney. There is no evident renal mass or hydronephrosis on either side. There is no renal or ureteral calculus on either side. Urinary bladder is midline with wall thickness within normal limits. Stomach/Bowel: There is again noted evidence of previous colectomy with left lower quadrant ostomy. 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 There is proximal small bowel dilatation with a transition zone in the distal jejunal region consistent with bowel obstruction. No free air or portal venous air is evident. Vascular/Lymphatic: There is aortic and iliac artery atherosclerosis. No aneurysm evident. Major mesenteric arterial vessels appear patent. No adenopathy is appreciable in the abdomen or pelvis. Reproductive: Uterus is absent.  No pelvic mass evident. Other: Soft tissue thickening in the posterior portion of pelvis is likely of postoperative etiology. No abscess or ascites is evident in the abdomen or pelvis. Musculoskeletal: There is stable 3 mm of  anterolisthesis of L4 on L5, felt to be due to underlying spondylosis. There are no blastic or lytic bone lesions. No intramuscular lesions are evident. IMPRESSION: 1. There is transition zone consistent with bowel obstruction near the jejunoileal junction. No appreciable free air. 2. 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 No bowel obstruction in the region of this hernia. 3. 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 Recommend follow up pre and post contrast MRI/MRCP or pancreatic protocol CT in 2 years. This recommendation follows ACR consensus guidelines: Management of Incidental Pancreatic Cysts: A White Paper of the ACR Incidental Findings Committee. [place] 7209;47:[id number]. 4.  Hepatic steatosis. 5.  Aortoiliac atherosclerosis. 6.  Stable mild spondylolisthesis at L4-5. 7. Chronic right renal atrophy. No renal or ureteral calculus. No hydronephrosis. Urinary bladder wall thickness within normal limits. Electronically Signed   By: Lowella Grip III M.D.   On: 05/28/2018 20:24   Dg Abd Portable 1v  Result Date: 05/30/2018 CLINICAL DATA:  Small-bowel obstruction. EXAM: PORTABLE ABDOMEN - 1 VIEW COMPARISON:  05/29/2018. FINDINGS: NG tube noted with tip over the stomach. Distended loops of small bowel again noted. Small bowel wall thickening  is noted suggesting small bowel wall edema. No free air. Contrast from prior CT noted in the bladder. Visceral atherosclerotic vascular calcification noted. No acute bony abnormality. IMPRESSION: NG tube noted with tip over the stomach. Distended loops of small bowel again noted. Small bowel wall thickening is noted suggesting small bowel wall edema. Electronically Signed   By: Marcello Moores  Register   On: 05/30/2018 07:55   Dg Abd Portable 1v-small Bowel Obstruction Protocol-initial, 8 Hr Delay  Result Date: 05/29/2018 CLINICAL  DATA:  Small bowel obstruction EXAM: PORTABLE ABDOMEN - 1 VIEW COMPARISON:  05/28/2018 FINDINGS: NG tube remains in the stomach, unchanged. Dilated small bowel loops in the lower abdomen and pelvis again noted, unchanged. No organomegaly or free air. IMPRESSION: Continued dilated small bowel loops.  No significant change. Electronically Signed   By: Rolm Baptise M.D.   On: 05/29/2018 08:17   Dg Abd Portable 1v-small Bowel Protocol-position Verification  Result Date: 05/28/2018 CLINICAL DATA:  Initial evaluation for NG tube placement EXAM: PORTABLE ABDOMEN - 1 VIEW COMPARISON:  Prior CT from 05/28/2018 FINDINGS: Enteric tube in place with tip overlying the gastric body, side hole well beyond the GE junction. Tip projects inferiorly. Persistent dilated loop of small bowel within the lower abdomen measures up to 4.2 cm, compatible with ongoing small bowel obstruction. Contrast material seen within the renal collecting system and bladder. Visualized osseous structures within normal limits. IMPRESSION: 1. Tip of enteric tube overlying the gastric body, side hole well beyond the GE junction. Tip projects inferiorly. 2. Persistent dilated loop of small bowel within the lower abdomen, compatible with ongoing small bowel obstruction. Electronically Signed   By: Jeannine Boga M.D.   On: 05/28/2018 23:24        Scheduled Meds:  docusate sodium  100 mg Oral BID   enoxaparin (LOVENOX) injection  40 mg Subcutaneous Q24H   insulin aspart  0-9 Units Subcutaneous Q4H   potassium chloride  20 mEq Oral TID   sodium chloride flush  3 mL Intravenous Once   Continuous Infusions:  dextrose 5 % and 0.45 % NaCl with KCl 40 mEq/L 75 mL/hr at 05/30/18 0900     LOS: 2 days    Time spent: 59min    Domenic Polite, MD [facility]  05/30/2018, 3:11 PM

## 2018-05-31 LAB — BASIC METABOLIC PANEL
Anion gap: 10 (ref 5–15)
BUN: 22 mg/dL (ref 8–23)
CO2: 18 mmol/L — ABNORMAL LOW (ref 22–32)
Calcium: 8.8 mg/dL — ABNORMAL LOW (ref 8.9–10.3)
Chloride: 108 mmol/L (ref 98–111)
Creatinine, Ser: 1.28 mg/dL — ABNORMAL HIGH (ref 0.44–1.00)
GFR calc Af Amer: 47 mL/min — ABNORMAL LOW (ref >60)
GFR calc non Af Amer: 40 mL/min — ABNORMAL LOW (ref >60)
Glucose, Bld: 120 mg/dL — ABNORMAL HIGH (ref 70–99)
Potassium: 4.3 mmol/L (ref 3.5–5.1)
Sodium: 136 mmol/L (ref 135–145)

## 2018-05-31 LAB — GLUCOSE, CAPILLARY
Glucose-Capillary: 104 mg/dL — ABNORMAL HIGH (ref 70–99)
Glucose-Capillary: 110 mg/dL — ABNORMAL HIGH (ref 70–99)
Glucose-Capillary: 114 mg/dL — ABNORMAL HIGH (ref 70–99)
Glucose-Capillary: 119 mg/dL — ABNORMAL HIGH (ref 70–99)
Glucose-Capillary: 120 mg/dL — ABNORMAL HIGH (ref 70–99)
Glucose-Capillary: 129 mg/dL — ABNORMAL HIGH (ref 70–99)

## 2018-05-31 LAB — CBC
HCT: 33.4 % — ABNORMAL LOW (ref 36.0–46.0)
Hemoglobin: 10.9 g/dL — ABNORMAL LOW (ref 12.0–15.0)
MCH: 28 pg (ref 26.0–34.0)
MCHC: 32.6 g/dL (ref 30.0–36.0)
MCV: 85.9 fL (ref 80.0–100.0)
Platelets: 166 10*3/uL (ref 150–400)
RBC: 3.89 MIL/uL (ref 3.87–5.11)
RDW: 15.1 % (ref 11.5–15.5)
WBC: 6.5 10*3/uL (ref 4.0–10.5)
nRBC: 0 % (ref 0.0–0.2)

## 2018-05-31 MED ORDER — ZOLPIDEM TARTRATE 5 MG PO TABS
5.0000 mg | ORAL_TABLET | Freq: Once | ORAL | Status: DC
  Filled 2018-05-31: qty 1

## 2018-05-31 NOTE — Progress Notes (Signed)
[facility] Progress Note     Subjective: CC: sbo Patient complaining of cramping pain in central abdomen. Denies nausea. Having large volume output from ileostomy.   Objective: Vital signs in last 24 hours: Temp:  [98 F (36.7 C)-99.1 F (37.3 C)] 98.8 F (37.1 C) (05/29 0333) Pulse Rate:  [57-60] 60 (05/29 0333) Resp:  [12-17] 16 (05/29 0333) BP: (135-152)/(58-61) 135/59 (05/29 0333) SpO2:  [96 %-99 %] 96 % (05/29 0333) Last BM Date: 05/30/18  Intake/Output from previous day: 05/28 0701 - 05/29 0700 In: 1953.8 [P.O.:480; I.V.:1173.8; NG/GT:300] Out: 3095 [Urine:100; Emesis/NG output:750; HERDE:0814] Intake/Output this shift: No intake/output data recorded.  PE: Gen: Alert, NAD, pleasant Card: Regular rate and rhythm Pulm: Normal effort, clear to auscultation bilaterally Abd: Soft,non-tender, not distended,+BS, ileostomy in LLQ with liquid stool and gas in pouch Skin: warm and dry, no rashes  Psych: A&Ox3   Lab Results:  Recent Labs    05/30/18 0205 05/31/18 0213  WBC 5.3 6.5  HGB 10.1* 10.9*  HCT 30.7* 33.4*  PLT 158 166   BMET Recent Labs    05/30/18 0205 05/31/18 0213  NA 142 136  K 3.1* 4.3  CL 111 108  CO2 20* 18*  GLUCOSE 106* 120*  BUN 33* 22  CREATININE 1.64* 1.28*  CALCIUM 8.5* 8.8*   PT/INR No results for input(s): LABPROT, INR in the last 72 hours. CMP     Component Value Date/Time   NA 136 05/31/2018 0213   K 4.3 05/31/2018 0213   CL 108 05/31/2018 0213   CO2 18 (L) 05/31/2018 0213   GLUCOSE 120 (H) 05/31/2018 0213   BUN 22 05/31/2018 0213   CREATININE 1.28 (H) 05/31/2018 0213   CREATININE 1.46 (H) 05/31/2017 1538   CALCIUM 8.8 (L) 05/31/2018 0213   PROT 7.0 05/28/2018 1307   ALBUMIN 3.5 05/28/2018 1307   AST 17 05/28/2018 1307   ALT 14 05/28/2018 1307   ALKPHOS 84 05/28/2018 1307   BILITOT 1.0 05/28/2018 1307   GFRNONAA 40 (L) 05/31/2018 0213   GFRAA 47 (L) 05/31/2018 0213   Lipase     Component Value  Date/Time   LIPASE 40 05/28/2018 1307       Studies/Results: Dg Abd Portable 1v  Result Date: 05/30/2018 CLINICAL DATA:  Small-bowel obstruction. EXAM: PORTABLE ABDOMEN - 1 VIEW COMPARISON:  05/29/2018. FINDINGS: NG tube noted with tip over the stomach. Distended loops of small bowel again noted. Small bowel wall thickening is noted suggesting small bowel wall edema. No free air. Contrast from prior CT noted in the bladder. Visceral atherosclerotic vascular calcification noted. No acute bony abnormality. IMPRESSION: NG tube noted with tip over the stomach. Distended loops of small bowel again noted. Small bowel wall thickening is noted suggesting small bowel wall edema. Electronically Signed   By: Marcello Moores  Register   On: 05/30/2018 07:55   Dg Abd Portable 1v-small Bowel Obstruction Protocol-initial, 8 Hr Delay  Result Date: 05/29/2018 CLINICAL DATA:  Small bowel obstruction EXAM: PORTABLE ABDOMEN - 1 VIEW COMPARISON:  05/28/2018 FINDINGS: NG tube remains in the stomach, unchanged. Dilated small bowel loops in the lower abdomen and pelvis again noted, unchanged. No organomegaly or free air. IMPRESSION: Continued dilated small bowel loops.  No significant change. Electronically Signed   By: Rolm Baptise M.D.   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

## 2018-05-31 NOTE — Progress Notes (Signed)
PROGRESS NOTE    Angelica French  GGY:[id number] DOB: 1940/07/22 DOA: 05/28/2018 PCP: Thornton Dales I, MD  Brief Narrative: 78 year old female with history of ulcerative colitis status post remote total colectomy 30 years ago, recurrent SBO's, type 2 diabetes mellitus, hypertension, presented to the emergency room with increasing crampy abdominal pain, nausea of vomiting and abdominal distention x2 days. -CT abdomen pelvis showed small bowel obstruction -General surgery was consulted, NG tube placed for decompression, clinically improving with conservative management  Assessment & Plan:   Partial small bowel obstruction -Remote history of total colectomy for UC with ileostomy 30 years ago, history of ileostomy revision -General surgery following -Improving with conservative management, NG tube was removed yesterday, diet being advanced to full liquids today, continue IV fluids -Encourage activity, ambulate in halls -labs in am  Hypokalemia -Ordered replacement  Type 2 diabetes mellitus -Oral hypoglycemics on hold, continue sliding scale insulin  Hypertension -Oral meds held including diuretics -IV hydralazine as needed  Chronic kidney disease stage III -Bump in creatinine, baseline is 1.3-1.4 range -Stable  History of ulcerative colitis -Remote total colectomy and ileostomy -See above  DVT prophylaxis: Lovenox Code Status: Full code Family Communication: No family at bedside Disposition Plan: home in 1-2days  Consultants:   General surgery   Procedures:   Antimicrobials:    Subjective: -feels better, tolerating full liquids  Objective: Vitals:   05/30/18 1408 05/30/18 2026 05/31/18 0333 05/31/18 1450  BP: (!) 152/61 (!) 151/58 (!) 135/59 (!) 150/59  Pulse: (!) 57 (!) 59 60 61  Resp: 12 17 16 16   Temp: 98 F (36.7 C) 99.1 F (37.3 C) 98.8 F (37.1 C) 98.6 F (37 C)  TempSrc: Oral Oral Oral Oral  SpO2: 99% 98% 96% 95%    Intake/Output Summary  (Last 24 hours) at 05/31/2018 1500 Last data filed at 05/31/2018 1312 Gross per 24 hour  Intake 2193.82 ml  Output 3125 ml  Net -931.18 ml   There were no vitals filed for this visit.  Examination: Gen: Awake, Alert, Oriented X 3, no distress HEENT: PERRLA, Neck supple, no JVD Lungs: CTAB CVS: S1S2/RRR Abd: Soft, less distended, ileostomy with liquid brown stool, bowel sounds decreased but present Extremities: No edema Skin: no new rashes Psychiatry: Judgement and insight appear normal. Mood & affect appropriate.     Data Reviewed:   CBC: Recent Labs  Lab 05/28/18 1307 05/29/18 0239 05/30/18 0205 05/31/18 0213  WBC 9.5 5.5 5.3 6.5  HGB 12.5 11.1* 10.1* 10.9*  HCT 38.7 33.9* 30.7* 33.4*  MCV 86.2 84.5 85.8 85.9  PLT 198 180 158 035   Basic Metabolic Panel: Recent Labs  Lab 05/28/18 1307 05/29/18 0239 05/30/18 0205 05/31/18 0213  NA 138 138 142 136  K 4.0 3.8 3.1* 4.3  CL 106 106 111 108  CO2 19* 21* 20* 18*  GLUCOSE 184* 126* 106* 120*  BUN 25* 28* 33* 22  CREATININE 1.43* 1.47* 1.64* 1.28*  CALCIUM 9.4 8.8* 8.5* 8.8*   GFR: CrCl cannot be calculated (Unknown ideal weight.). Liver Function Tests: Recent Labs  Lab 05/28/18 1307  AST 17  ALT 14  ALKPHOS 84  BILITOT 1.0  PROT 7.0  ALBUMIN 3.5   Recent Labs  Lab 05/28/18 1307  LIPASE 40   No results for input(s): AMMONIA in the last 168 hours. Coagulation Profile: No results for input(s): INR, PROTIME in the last 168 hours. Cardiac Enzymes: No results for input(s): CKTOTAL, CKMB, CKMBINDEX, TROPONINI in the last 168 hours. BNP (  last 3 results) No results for input(s): PROBNP in the last 8760 hours. HbA1C: No results for input(s): HGBA1C in the last 72 hours. CBG: Recent Labs  Lab 05/30/18 2100 05/30/18 2350 05/31/18 0336 05/31/18 0752 05/31/18 1122  GLUCAP 108* 101* 110* 114* 129*   Lipid Profile: No results for input(s): CHOL, HDL, LDLCALC, TRIG, CHOLHDL, LDLDIRECT in the last 72  hours. Thyroid Function Tests: No results for input(s): TSH, T4TOTAL, FREET4, T3FREE, THYROIDAB in the last 72 hours. Anemia Panel: No results for input(s): VITAMINB12, FOLATE, FERRITIN, TIBC, IRON, RETICCTPCT in the last 72 hours. Urine analysis:    Component Value Date/Time   COLORURINE YELLOW 05/28/2018 2038   APPEARANCEUR HAZY (A) 05/28/2018 2038   LABSPEC 1.035 (H) 05/28/2018 2038   PHURINE 5.0 05/28/2018 2038   GLUCOSEU NEGATIVE 05/28/2018 2038   HGBUR NEGATIVE 05/28/2018 2038   BILIRUBINUR NEGATIVE 05/28/2018 2038   [place] NEGATIVE 05/28/2018 2038   PROTEINUR NEGATIVE 05/28/2018 2038   NITRITE NEGATIVE 05/28/2018 2038   LEUKOCYTESUR MODERATE (A) 05/28/2018 2038   Sepsis Labs: @LABRCNTIP (procalcitonin:4,lacticidven:4)  ) Recent Results (from the past 240 hour(s))  SARS Coronavirus 2 (CEPHEID - Performed in [place] hospital lab), Hosp Order     Status: None   Collection Time: 05/28/18  9:43 PM  Result Value Ref Range Status   SARS Coronavirus 2 NEGATIVE NEGATIVE Final    Comment: (NOTE) If result is NEGATIVE SARS-CoV-2 target nucleic acids are NOT DETECTED. The SARS-CoV-2 RNA is generally detectable in upper and lower  respiratory specimens during the acute phase of infection. The lowest  concentration of SARS-CoV-2 viral copies this assay can detect is 250  copies / mL. A negative result does not preclude SARS-CoV-2 infection  and should not be used as the sole basis for treatment or other  patient management decisions.  A negative result may occur with  improper specimen collection / handling, submission of specimen other  than nasopharyngeal swab, presence of viral mutation(s) within the  areas targeted by this assay, and inadequate number of viral copies  (<250 copies / mL). A negative result must be combined with clinical  observations, patient history, and epidemiological information. If result is POSITIVE SARS-CoV-2 target nucleic acids are DETECTED.  The SARS-CoV-2 RNA is generally detectable in upper and lower  respiratory specimens dur ing the acute phase of infection.  Positive  results are indicative of active infection with SARS-CoV-2.  Clinical  correlation with patient history and other diagnostic information is  necessary to determine patient infection status.  Positive results do  not rule out bacterial infection or co-infection with other viruses. If result is PRESUMPTIVE POSTIVE SARS-CoV-2 nucleic acids MAY BE PRESENT.   A presumptive positive result was obtained on the submitted specimen  and confirmed on repeat testing.  While 2019 novel coronavirus  (SARS-CoV-2) nucleic acids may be present in the submitted sample  additional confirmatory testing may be necessary for epidemiological  and / or clinical management purposes  to differentiate between  SARS-CoV-2 and other Sarbecovirus currently known to infect humans.  If clinically indicated additional testing with an alternate test  methodology [phone] is advised. The SARS-CoV-2 RNA is generally  detectable in upper and lower respiratory sp ecimens during the acute  phase of infection. The expected result is Negative. Fact Sheet for Patients:  StrictlyIdeas.no Fact Sheet for Healthcare Providers: BankingDealers.co.za This test is not yet approved or cleared by the [place] FDA and has been authorized for detection and/or diagnosis of SARS-CoV-2 by FDA  under an Emergency Use Authorization (EUA).  This EUA will remain in effect (meaning this test can be used) for the duration of the COVID-19 declaration under Section 564(b)(1) of the Act, 21 U.S.C. section 360bbb-3(b)(1), unless the authorization is terminated or revoked sooner. Performed at [facility], [place] [address], [place], [place] [address]          Radiology Studies: Dg Abd Portable 1v  Result Date: 05/30/2018 CLINICAL DATA:  Small-bowel  obstruction. EXAM: PORTABLE ABDOMEN - 1 VIEW COMPARISON:  05/29/2018. FINDINGS: NG tube noted with tip over the stomach. Distended loops of small bowel again noted. Small bowel wall thickening is noted suggesting small bowel wall edema. No free air. Contrast from prior CT noted in the bladder. Visceral atherosclerotic vascular calcification noted. No acute bony abnormality. IMPRESSION: NG tube noted with tip over the stomach. Distended loops of small bowel again noted. Small bowel wall thickening is noted suggesting small bowel wall edema. Electronically Signed   By: Marcello Moores  Register   On: 05/30/2018 07:55        Scheduled Meds: . docusate sodium  100 mg Oral BID  . enoxaparin (LOVENOX) injection  40 mg Subcutaneous Q24H  . insulin aspart  0-9 Units Subcutaneous Q4H  . sodium chloride flush  3 mL Intravenous Once  . zolpidem  5 mg Oral Once   Continuous Infusions: . dextrose 5 % and 0.45 % NaCl with KCl 40 mEq/L 50 mL/hr at 05/30/18 1631     LOS: 3 days    Time spent: 62min    Domenic Polite, MD [facility]  05/31/2018, 3:00 PM

## 2018-06-01 LAB — BASIC METABOLIC PANEL
Anion gap: 10 (ref 5–15)
BUN: 20 mg/dL (ref 8–23)
CO2: 18 mmol/L — ABNORMAL LOW (ref 22–32)
Calcium: 9.2 mg/dL (ref 8.9–10.3)
Chloride: 108 mmol/L (ref 98–111)
Creatinine, Ser: 1.32 mg/dL — ABNORMAL HIGH (ref 0.44–1.00)
GFR calc Af Amer: 45 mL/min — ABNORMAL LOW (ref >60)
GFR calc non Af Amer: 39 mL/min — ABNORMAL LOW (ref >60)
Glucose, Bld: 120 mg/dL — ABNORMAL HIGH (ref 70–99)
Potassium: 4.9 mmol/L (ref 3.5–5.1)
Sodium: 136 mmol/L (ref 135–145)

## 2018-06-01 LAB — CBC
HCT: 34.5 % — ABNORMAL LOW (ref 36.0–46.0)
Hemoglobin: 11.2 g/dL — ABNORMAL LOW (ref 12.0–15.0)
MCH: 27.9 pg (ref 26.0–34.0)
MCHC: 32.5 g/dL (ref 30.0–36.0)
MCV: 85.8 fL (ref 80.0–100.0)
Platelets: 179 10*3/uL (ref 150–400)
RBC: 4.02 MIL/uL (ref 3.87–5.11)
RDW: 14.8 % (ref 11.5–15.5)
WBC: 7.3 10*3/uL (ref 4.0–10.5)
nRBC: 0 % (ref 0.0–0.2)

## 2018-06-01 LAB — GLUCOSE, CAPILLARY
Glucose-Capillary: 120 mg/dL — ABNORMAL HIGH (ref 70–99)
Glucose-Capillary: 105 mg/dL — ABNORMAL HIGH (ref 70–99)
Glucose-Capillary: 136 mg/dL — ABNORMAL HIGH (ref 70–99)
Glucose-Capillary: 128 mg/dL — ABNORMAL HIGH (ref 70–99)
Glucose-Capillary: 149 mg/dL — ABNORMAL HIGH (ref 70–99)

## 2018-06-01 MED ORDER — HYDROCHLOROTHIAZIDE 12.5 MG PO CAPS
12.5000 mg | ORAL_CAPSULE | Freq: Every day | ORAL | Status: DC
  Administered 2018-06-01 – 2018-06-02 (×2): 12.5 mg via ORAL
  Filled 2018-06-01 (×2): qty 1

## 2018-06-01 NOTE — Progress Notes (Addendum)
  Subjective: Tolerating full liquids.  Good ileostomy output.  No nausea or vomiting.  Going to try an egg salad sandwich for lunch.  No cramping but says her abdomen is slightly distended compared to baseline. Stable and alert. Hemoglobin 11.2.  WBC 7300.  Creatinine 1.32.  Potassium 4.9. Objective: Vital signs in last 24 hours: Temp:  [98 F (36.7 C)-99.2 F (37.3 C)] 98 F (36.7 C) (05/30 0410) Pulse Rate:  [54-61] 54 (05/30 0410) Resp:  [16-18] 18 (05/30 0410) BP: (150-158)/(58-59) 153/58 (05/30 0410) SpO2:  [95 %-99 %] 98 % (05/30 0410) Last BM Date: 06/01/18  Intake/Output from previous day: 05/29 0701 - 05/30 0700 In: 24 [P.O.:960] Out: 1625 [Urine:100; Stool:1525] Intake/Output this shift: Total I/O In: 360 [P.O.:360] Out: -     PE: Gen: Alert, NAD, pleasant Card: Regular rate and rhythm Pulm: Normal effort, clear to auscultation bilaterally Abd: Soft,non-tender,notclinically distended,+BS, ileostomy in LLQ with liquid stool and gas in pouch Skin: warm and dry, no rashes  Psych: A&Ox3   Lab Results:  Recent Labs    05/31/18 0213 06/01/18 0437  WBC 6.5 7.3  HGB 10.9* 11.2*  HCT 33.4* 34.5*  PLT 166 179   BMET Recent Labs    05/31/18 0213 06/01/18 0437  NA 136 136  K 4.3 4.9  CL 108 108  CO2 18* 18*  GLUCOSE 120* 120*  BUN 22 20  CREATININE 1.28* 1.32*  CALCIUM 8.8* 9.2   PT/INR No results for input(s): LABPROT, INR in the last 72 hours. ABG No results for input(s): PHART, HCO3 in the last 72 hours.  Invalid input(s): PCO2, PO2  Studies/Results: No results found.  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  She wants to go slow  which is fine.  To try some soft diet today cautiously -Hopefully she will continue to progress and go home tomorrow - mobilize as tolerated - hopefully this will resolve without surgical intervention, but we will follow   FEN:FLD, IVF VTE: SCDs, lovenox ID: no abx indicated    LOS: 4 days    Adin Hector 06/01/2018

## 2018-06-01 NOTE — Progress Notes (Signed)
PROGRESS NOTE    Angelica French  LXB:[id number] DOB: 1940-07-12 DOA: 05/28/2018 PCP: Thornton Dales I, MD  Brief Narrative: 78 year old female with history of ulcerative colitis status post remote total colectomy 30 years ago, recurrent SBO's, type 2 diabetes mellitus, hypertension, presented to the emergency room with increasing crampy abdominal pain, nausea of vomiting and abdominal distention x2 days. -CT abdomen pelvis showed small bowel obstruction -General surgery was consulted, NG tube placed for decompression, clinically improving with conservative management  Assessment & Plan:   Partial small bowel obstruction -Remote history of total colectomy for UC with ileostomy 30 years ago, history of ileostomy revision -General surgery following -Doing with conservative management, NG tube removed, diet being advanced, tolerated full liquids will advance to soft diet today -Encouraged increase activity, ambulation in the halls  Hypokalemia -Ordered replacement  Type 2 diabetes mellitus -Oral hypoglycemics on hold, continue sliding scale insulin  Hypertension -Oral meds held including diuretics, resume HCTZ -We will decrease losartan dose at discharge -IV hydralazine as needed  Chronic kidney disease stage III -Bump in creatinine, baseline is 1.3-1.4 range -Stable  History of ulcerative colitis -Remote total colectomy and ileostomy -See above  DVT prophylaxis: Lovenox Code Status: Full code Family Communication: No family at bedside Disposition Plan: home home tomorrow if tolerating diet  Consultants:   General surgery   Procedures:   Antimicrobials:    Subjective: -Feels well, tolerating full liquid diet, wants more solid food  Objective: Vitals:   05/31/18 0333 05/31/18 1450 05/31/18 2107 06/01/18 0410  BP: (!) 135/59 (!) 150/59 (!) 158/59 (!) 153/58  Pulse: 60 61 60 (!) 54  Resp: 16 16 18 18   Temp: 98.8 F (37.1 C) 98.6 F (37 C) 99.2 F (37.3 C)  98 F (36.7 C)  TempSrc: Oral Oral Oral Oral  SpO2: 96% 95% 99% 98%    Intake/Output Summary (Last 24 hours) at 06/01/2018 1405 Last data filed at 06/01/2018 1220 Gross per 24 hour  Intake 1080 ml  Output 1175 ml  Net -95 ml   There were no vitals filed for this visit.  Examination: Gen: Awake, Alert, Oriented X 3, no distress HEENT: PERRLA, Neck supple, no JVD Lungs: Clear bilaterally CVS: RRR,No Gallops,Rubs or new Murmurs Abd: Soft, less distended, ileostomy with liquid brown stool, bowel sounds present Extremities: No edema Skin: no new rashes Psychiatry: Judgement and insight appear normal. Mood & affect appropriate.     Data Reviewed:   CBC: Recent Labs  Lab 05/28/18 1307 05/29/18 0239 05/30/18 0205 05/31/18 0213 06/01/18 0437  WBC 9.5 5.5 5.3 6.5 7.3  HGB 12.5 11.1* 10.1* 10.9* 11.2*  HCT 38.7 33.9* 30.7* 33.4* 34.5*  MCV 86.2 84.5 85.8 85.9 85.8  PLT 198 180 158 166 416   Basic Metabolic Panel: Recent Labs  Lab 05/28/18 1307 05/29/18 0239 05/30/18 0205 05/31/18 0213 06/01/18 0437  NA 138 138 142 136 136  K 4.0 3.8 3.1* 4.3 4.9  CL 106 106 111 108 108  CO2 19* 21* 20* 18* 18*  GLUCOSE 184* 126* 106* 120* 120*  BUN 25* 28* 33* 22 20  CREATININE 1.43* 1.47* 1.64* 1.28* 1.32*  CALCIUM 9.4 8.8* 8.5* 8.8* 9.2   GFR: CrCl cannot be calculated (Unknown ideal weight.). Liver Function Tests: Recent Labs  Lab 05/28/18 1307  AST 17  ALT 14  ALKPHOS 84  BILITOT 1.0  PROT 7.0  ALBUMIN 3.5   Recent Labs  Lab 05/28/18 1307  LIPASE 40   No results for input(s):  AMMONIA in the last 168 hours. Coagulation Profile: No results for input(s): INR, PROTIME in the last 168 hours. Cardiac Enzymes: No results for input(s): CKTOTAL, CKMB, CKMBINDEX, TROPONINI in the last 168 hours. BNP (last 3 results) No results for input(s): PROBNP in the last 8760 hours. HbA1C: No results for input(s): HGBA1C in the last 72 hours. CBG: Recent Labs  Lab 05/31/18  2104 05/31/18 2356 06/01/18 0407 06/01/18 0826 06/01/18 1219  GLUCAP 119* 120* 120* 105* 136*   Lipid Profile: No results for input(s): CHOL, HDL, LDLCALC, TRIG, CHOLHDL, LDLDIRECT in the last 72 hours. Thyroid Function Tests: No results for input(s): TSH, T4TOTAL, FREET4, T3FREE, THYROIDAB in the last 72 hours. Anemia Panel: No results for input(s): VITAMINB12, FOLATE, FERRITIN, TIBC, IRON, RETICCTPCT in the last 72 hours. Urine analysis:    Component Value Date/Time   COLORURINE YELLOW 05/28/2018 2038   APPEARANCEUR HAZY (A) 05/28/2018 2038   LABSPEC 1.035 (H) 05/28/2018 2038   PHURINE 5.0 05/28/2018 2038   GLUCOSEU NEGATIVE 05/28/2018 2038   HGBUR NEGATIVE 05/28/2018 2038   BILIRUBINUR NEGATIVE 05/28/2018 2038   [place] NEGATIVE 05/28/2018 2038   PROTEINUR NEGATIVE 05/28/2018 2038   NITRITE NEGATIVE 05/28/2018 2038   LEUKOCYTESUR MODERATE (A) 05/28/2018 2038   Sepsis Labs: @LABRCNTIP (procalcitonin:4,lacticidven:4)  ) Recent Results (from the past 240 hour(s))  SARS Coronavirus 2 (CEPHEID - Performed in [place] hospital lab), Hosp Order     Status: None   Collection Time: 05/28/18  9:43 PM  Result Value Ref Range Status   SARS Coronavirus 2 NEGATIVE NEGATIVE Final    Comment: (NOTE) If result is NEGATIVE SARS-CoV-2 target nucleic acids are NOT DETECTED. The SARS-CoV-2 RNA is generally detectable in upper and lower  respiratory specimens during the acute phase of infection. The lowest  concentration of SARS-CoV-2 viral copies this assay can detect is 250  copies / mL. A negative result does not preclude SARS-CoV-2 infection  and should not be used as the sole basis for treatment or other  patient management decisions.  A negative result may occur with  improper specimen collection / handling, submission of specimen other  than nasopharyngeal swab, presence of viral mutation(s) within the  areas targeted by this assay, and inadequate number of viral copies   (<250 copies / mL). A negative result must be combined with clinical  observations, patient history, and epidemiological information. If result is POSITIVE SARS-CoV-2 target nucleic acids are DETECTED. The SARS-CoV-2 RNA is generally detectable in upper and lower  respiratory specimens dur ing the acute phase of infection.  Positive  results are indicative of active infection with SARS-CoV-2.  Clinical  correlation with patient history and other diagnostic information is  necessary to determine patient infection status.  Positive results do  not rule out bacterial infection or co-infection with other viruses. If result is PRESUMPTIVE POSTIVE SARS-CoV-2 nucleic acids MAY BE PRESENT.   A presumptive positive result was obtained on the submitted specimen  and confirmed on repeat testing.  While 2019 novel coronavirus  (SARS-CoV-2) nucleic acids may be present in the submitted sample  additional confirmatory testing may be necessary for epidemiological  and / or clinical management purposes  to differentiate between  SARS-CoV-2 and other Sarbecovirus currently known to infect humans.  If clinically indicated additional testing with an alternate test  methodology [phone]) is advised. The SARS-CoV-2 RNA is generally  detectable in upper and lower respiratory sp ecimens during the acute  phase of infection. The expected result is Negative. Fact Sheet  for Patients:  StrictlyIdeas.no Fact Sheet for Healthcare Providers: BankingDealers.co.za This test is not yet approved or cleared by the [place] FDA and has been authorized for detection and/or diagnosis of SARS-CoV-2 by FDA under an Emergency Use Authorization (EUA).  This EUA will remain in effect (meaning this test can be used) for the duration of the COVID-19 declaration under Section 564(b)(1) of the Act, 21 U.S.C. section 360bbb-3(b)(1), unless the authorization is terminated or  revoked sooner. Performed at [facility], [place] [address], [place], [place] [address]          Radiology Studies: No results found.      Scheduled Meds: . docusate sodium  100 mg Oral BID  . enoxaparin (LOVENOX) injection  40 mg Subcutaneous Q24H  . insulin aspart  0-9 Units Subcutaneous Q4H  . sodium chloride flush  3 mL Intravenous Once  . zolpidem  5 mg Oral Once   Continuous Infusions:    LOS: 4 days    Time spent: 23min    Domenic Polite, MD [facility]  06/01/2018, 2:05 PM

## 2018-06-02 LAB — BASIC METABOLIC PANEL
Anion gap: 9 (ref 5–15)
BUN: 23 mg/dL (ref 8–23)
CO2: 21 mmol/L — ABNORMAL LOW (ref 22–32)
Calcium: 9.1 mg/dL (ref 8.9–10.3)
Chloride: 106 mmol/L (ref 98–111)
Creatinine, Ser: 1.42 mg/dL — ABNORMAL HIGH (ref 0.44–1.00)
GFR calc Af Amer: 41 mL/min — ABNORMAL LOW (ref >60)
GFR calc non Af Amer: 36 mL/min — ABNORMAL LOW (ref >60)
Glucose, Bld: 126 mg/dL — ABNORMAL HIGH (ref 70–99)
Potassium: 4.4 mmol/L (ref 3.5–5.1)
Sodium: 136 mmol/L (ref 135–145)

## 2018-06-02 LAB — GLUCOSE, CAPILLARY
Glucose-Capillary: 108 mg/dL — ABNORMAL HIGH (ref 70–99)
Glucose-Capillary: 117 mg/dL — ABNORMAL HIGH (ref 70–99)
Glucose-Capillary: 127 mg/dL — ABNORMAL HIGH (ref 70–99)

## 2018-06-02 NOTE — Progress Notes (Signed)
Angelica French to be D/C'd per MD order. Discussed with the patient and all questions fully answered. ? VSS, Skin clean, dry and intact without evidence of skin break down, no evidence of skin tears noted. ? IV catheter discontinued intact. Site without signs and symptoms of complications. Dressing and pressure applied. ? An After Visit Summary was printed and given to the patient.  ? D/c education completed with patient/family including follow up instructions, medication list, d/c activities limitations if indicated, with other d/c instructions as indicated by MD - patient able to verbalize understanding, all questions fully answered.  ? Patient instructed to return to ED, call 911, or call MD for any changes in condition.  ? Patient to be escorted via [place], and D/C home via private auto.

## 2018-06-02 NOTE — Discharge Summary (Signed)
Physician Discharge Summary  Angelica French:[id number] DOB: 18-Jul-1940 DOA: 05/28/2018  PCP: Thornton Dales I, MD  Admit date: 05/28/2018 Discharge date: 06/02/2018  Time spent: 35 minutes  Recommendations for Outpatient Follow-up:  1. PCP Dr.Moreira in 1 week 2. Cardiology Dr.Evans in 2 weeks 3. Restart Coreg and or hydralazine at lower dose depending on BP/HR at FU   Discharge Diagnoses:  Principal Problem:   SBO (small bowel obstruction) (HCC)   Ulcerative colitis with remote total colectomy and ileostomy   Benign hypertension with CKD (chronic kidney disease) stage III (HCC)   CKD (chronic kidney disease) stage 3, GFR 30-59 ml/min (HCC)   Type 2 diabetes mellitus with diabetic chronic kidney disease ([place])   Parastomal hernia without obstruction or gangrene   Discharge Condition: Stable  Diet recommendation: Low-sodium, diabetic  There were no vitals filed for this visit.  History of present illness:  78 year old female with history of ulcerative colitis status post remote total colectomy 30 years ago, recurrent SBO's, type 2 diabetes mellitus, hypertension, presented to the emergency room with increasing crampy abdominal pain, nausea of vomiting and abdominal distention x2 days. -CT abdomen pelvis showed small bowel obstruction  Hospital Course:   Partial small bowel obstruction -Remote history of total colectomy for UC with ileostomy 30 years ago, history of ileostomy revision -General surgery consulted -Clinically improved with conservative management, NG tube removed, diet  advanced, tolerated soft diet today at the time of discharge -Follow-up with general surgery as needed  Hypokalemia -Replaced  Type 2 diabetes mellitus -Diet controlled, no longer on meds for this  Hypertension -Restarted on HCTZ and losartan at discharge -Heart rate remained in the mid 50s to 60 range, hence she was not restarted on Coreg at discharge and when p.o. diet/meds  resumed -Hydralazine held at discharge due to BP being controlled at this time without it however if blood pressure trend increases will need to restart this  Chronic kidney disease stage III -Bump in creatinine, baseline is 1.3-1.4 range -Stable  History of ulcerative colitis -Remote total colectomy and ileostomy -See above  Consultations:  General surgery  Discharge Exam: Vitals:   06/01/18 1959 06/02/18 0420  BP: (!) 151/63 134/65  Pulse: 64 60  Resp: 16 18  Temp: 99.1 F (37.3 C) 98.7 F (37.1 C)  SpO2: 99% 99%    General: Awake oriented x3 Cardiovascular: S1-S2/regular rate rhythm Respiratory: Clear bilaterally Abdomen: Soft, nontender, nondistended, ileostomy with liquid brown stool, bowel sounds present  Discharge Instructions   Discharge Instructions    Diet - low sodium heart healthy   Complete by:  As directed    Discharge instructions   Complete by:  As directed    Soft foods, low sodium and diabetic diet   Increase activity slowly   Complete by:  As directed    Increase activity slowly   Complete by:  As directed      Allergies as of 06/02/2018      Reactions   Nsaids Nausea Only, Other (See Comments)   GI pain after prolonged use = ULCERS   Tape Hives, Itching   Aspirin Other (See Comments)   No buffered aspirin = GI pain that develops into ulcers   Coreg [carvedilol] Other (See Comments)   "Makes my back hurt"      Medication List    STOP taking these medications   carvedilol 25 MG tablet Commonly known as:  COREG   gabapentin 300 MG capsule Commonly known as:  NEURONTIN  hydrALAZINE 100 MG tablet Commonly known as:  APRESOLINE     TAKE these medications   acetaminophen 500 MG tablet Commonly known as:  TYLENOL Take 1,000 mg by mouth every 6 (six) hours as needed (for pain).   atorvastatin 20 MG tablet Commonly known as:  LIPITOR Take 20 mg by mouth. On Monday, Wednesday and Friday   Biotin 2500 MCG Caps Take 2,500 mcg  by mouth daily.   Centrum Silver 50+Women Tabs Take 1 tablet by mouth daily with breakfast.   clindamycin 2 % vaginal cream Commonly known as:  CLEOCIN Place 1 Applicatorful vaginally daily as needed (irritation).   Desoximetasone 0.05 % Gel Apply 1 application topically daily as needed (ostomy pouch irritation).   famotidine 20 MG tablet Commonly known as:  PEPCID Take 1 tablet (20 mg total) by mouth 2 (two) times daily.   furosemide 40 MG tablet Commonly known as:  LASIX Take 40 mg by mouth daily as needed for fluid or edema.   glimepiride 4 MG tablet Commonly known as:  AMARYL Take 1 tablet (4 mg total) by mouth 2 times daily at 12 noon and 4 pm. What changed:  Another medication with the same name was removed. Continue taking this medication, and follow the directions you see here.   hydrochlorothiazide 25 MG tablet Commonly known as:  HYDRODIURIL Take 25 mg by mouth every morning.   HYDROcodone-acetaminophen 7.5-325 MG tablet Commonly known as:  NORCO Take 1 tablet by mouth every 6 (six) hours as needed for severe pain (pain score 7-10).   losartan 100 MG tablet Commonly known as:  COZAAR Take 100 mg by mouth at bedtime.   omeprazole 40 MG capsule Commonly known as:  PRILOSEC Take 40 mg by mouth at bedtime.   potassium bicarbonate 25 MEQ disintegrating tablet Commonly known as:  K-LYTE Take 25 mEq by mouth daily as needed (ONLY WHEN TAKING FUROSEMIDE).   Systane 0.4-0.3 % Gel ophthalmic gel Generic drug:  Polyethyl Glycol-Propyl Glycol Place 1 application into both eyes at bedtime.   Victoza 18 MG/3ML Sopn Generic drug:  liraglutide Inject 0.6 mg into the skin daily before breakfast.   Vitamin D-3 25 MCG (1000 UT) Caps Take 1,000 Units by mouth every other day.      Allergies  Allergen Reactions  . Nsaids Nausea Only and Other (See Comments)    GI pain after prolonged use = ULCERS    . Tape Hives and Itching  . Aspirin Other (See Comments)    No  buffered aspirin = GI pain that develops into ulcers  . Coreg [Carvedilol] Other (See Comments)    "Makes my back hurt"   Follow-up Information    Thornton Dales I, MD. Schedule an appointment as soon as possible for a visit in 1 week(s).   Specialty:  Family Medicine Contact information: [address] [place] [address] [phone]            The results of significant diagnostics from this hospitalization (including imaging, microbiology, ancillary and laboratory) are listed below for reference.    Significant Diagnostic Studies: Ct Abdomen Pelvis W Contrast  Result Date: 05/28/2018 CLINICAL DATA:  Abdominal pain with nausea and vomiting EXAM: CT ABDOMEN AND PELVIS WITH CONTRAST TECHNIQUE: Multidetector CT imaging of the abdomen and pelvis was performed using the standard protocol following bolus administration of intravenous contrast. CONTRAST:  139mL OMNIPAQUE IOHEXOL 300 MG/ML  SOLN COMPARISON:  November 15, 2017 FINDINGS: Lower chest: There is mild scarring  in the lung bases. There is no lung base edema or consolidation. Hepatobiliary: There is hepatic steatosis. No focal liver lesions are demonstrable. The gallbladder wall is not appreciably thickened. There is no appreciable biliary duct dilatation. Pancreas: There remains a 6 x 5 mm focus of decreased attenuation in the uncinate process of the pancreas. No other pancreatic mass evident. There is no appreciable pancreatic duct dilatation. No peripancreatic inflammation. Spleen: No splenic lesions are evident. Adrenals/Urinary Tract: Adrenals appear normal bilaterally. Right kidney is atrophic with marked renal cortical thinning. There is a 7 mm cyst arising from the periphery the lower pole of the left kidney. There is no evident renal mass or hydronephrosis on either side. There is no renal or ureteral calculus on either side. Urinary bladder is midline with wall thickness within normal limits. Stomach/Bowel:  There is again noted evidence of previous colectomy with left lower quadrant ostomy. 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 There is proximal small bowel dilatation with a transition zone in the distal jejunal region consistent with bowel obstruction. No free air or portal venous air is evident. Vascular/Lymphatic: There is aortic and iliac artery atherosclerosis. No aneurysm evident. Major mesenteric arterial vessels appear patent. No adenopathy is appreciable in the abdomen or pelvis. Reproductive: Uterus is absent.  No pelvic mass evident. Other: Soft tissue thickening in the posterior portion of pelvis is likely of postoperative etiology. No abscess or ascites is evident in the abdomen or pelvis. Musculoskeletal: There is stable 3 mm of anterolisthesis of L4 on L5, felt to be due to underlying spondylosis. There are no blastic or lytic bone lesions. No intramuscular lesions are evident. IMPRESSION: 1. There is transition zone consistent with bowel obstruction near the jejunoileal junction. No appreciable free air. 2. 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 No bowel obstruction in the region of this hernia. 3. 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 Recommend follow up pre and post contrast MRI/MRCP or pancreatic protocol CT in 2 years. This recommendation follows ACR consensus guidelines: Management of Incidental Pancreatic Cysts: A White Paper of the ACR Incidental Findings Committee. Bracey 1017;[id number]. 4.  Hepatic steatosis. 5.  Aortoiliac atherosclerosis. 6.  Stable mild spondylolisthesis at L4-5. 7. Chronic right renal atrophy. No renal or ureteral calculus. No hydronephrosis. Urinary bladder wall thickness within normal limits. Electronically Signed   By: Lowella Grip III M.D.   On: 05/28/2018 20:24   Dg  Abd Portable 1v  Result Date: 05/30/2018 CLINICAL DATA:  Small-bowel obstruction. EXAM: PORTABLE ABDOMEN - 1 VIEW COMPARISON:  05/29/2018. FINDINGS: NG tube noted with tip over the stomach. Distended loops of small bowel again noted. Small bowel wall thickening is noted suggesting small bowel wall edema. No free air. Contrast from prior CT noted in the bladder. Visceral atherosclerotic vascular calcification noted. No acute bony abnormality. IMPRESSION: NG tube noted with tip over the stomach. Distended loops of small bowel again noted. Small bowel wall thickening is noted suggesting small bowel wall edema. Electronically Signed   By: Marcello Moores  Register   On: 05/30/2018 07:55   Dg Abd Portable 1v-small Bowel Obstruction Protocol-initial, 8 Hr Delay  Result Date: 05/29/2018 CLINICAL DATA:  Small bowel obstruction EXAM: PORTABLE ABDOMEN - 1 VIEW COMPARISON:  05/28/2018 FINDINGS: NG tube remains in the stomach, unchanged. Dilated small bowel loops in the  lower abdomen and pelvis again noted, unchanged. No organomegaly or free air. IMPRESSION: Continued dilated small bowel loops.  No significant change. Electronically Signed   By: Rolm Baptise M.D.   On: 05/29/2018 08:17   Dg Abd Portable 1v-small Bowel Protocol-position Verification  Result Date: 05/28/2018 CLINICAL DATA:  Initial evaluation for NG tube placement EXAM: PORTABLE ABDOMEN - 1 VIEW COMPARISON:  Prior CT from 05/28/2018 FINDINGS: Enteric tube in place with tip overlying the gastric body, side hole well beyond the GE junction. Tip projects inferiorly. Persistent dilated loop of small bowel within the lower abdomen measures up to 4.2 cm, compatible with ongoing small bowel obstruction. Contrast material seen within the renal collecting system and bladder. Visualized osseous structures within normal limits. IMPRESSION: 1. Tip of enteric tube overlying the gastric body, side hole well beyond the GE junction. Tip projects inferiorly. 2. Persistent  dilated loop of small bowel within the lower abdomen, compatible with ongoing small bowel obstruction. Electronically Signed   By: Jeannine Boga M.D.   On: 05/28/2018 23:24    Microbiology: Recent Results (from the past 240 hour(s))  SARS Coronavirus 2 (CEPHEID - Performed in [facility] hospital lab), Hosp Order     Status: None   Collection Time: 05/28/18  9:43 PM  Result Value Ref Range Status   SARS Coronavirus 2 NEGATIVE NEGATIVE Final    Comment: (NOTE) If result is NEGATIVE SARS-CoV-2 target nucleic acids are NOT DETECTED. The SARS-CoV-2 RNA is generally detectable in upper and lower  respiratory specimens during the acute phase of infection. The lowest  concentration of SARS-CoV-2 viral copies this assay can detect is 250  copies / mL. A negative result does not preclude SARS-CoV-2 infection  and should not be used as the sole basis for treatment or other  patient management decisions.  A negative result may occur with  improper specimen collection / handling, submission of specimen other  than nasopharyngeal swab, presence of viral mutation(s) within the  areas targeted by this assay, and inadequate number of viral copies  (<250 copies / mL). A negative result must be combined with clinical  observations, patient history, and epidemiological information. If result is POSITIVE SARS-CoV-2 target nucleic acids are DETECTED. The SARS-CoV-2 RNA is generally detectable in upper and lower  respiratory specimens dur ing the acute phase of infection.  Positive  results are indicative of active infection with SARS-CoV-2.  Clinical  correlation with patient history and other diagnostic information is  necessary to determine patient infection status.  Positive results do  not rule out bacterial infection or co-infection with other viruses. If result is PRESUMPTIVE POSTIVE SARS-CoV-2 nucleic acids MAY BE PRESENT.   A presumptive positive result was obtained on the submitted  specimen  and confirmed on repeat testing.  While 2019 novel coronavirus  (SARS-CoV-2) nucleic acids may be present in the submitted sample  additional confirmatory testing may be necessary for epidemiological  and / or clinical management purposes  to differentiate between  SARS-CoV-2 and other Sarbecovirus currently known to infect humans.  If clinically indicated additional testing with an alternate test  methodology [phone]) is advised. The SARS-CoV-2 RNA is generally  detectable in upper and lower respiratory sp ecimens during the acute  phase of infection. The expected result is Negative. Fact Sheet for Patients:  StrictlyIdeas.no Fact Sheet for Healthcare Providers: BankingDealers.co.za This test is not yet approved or cleared by the [place] FDA and has been authorized for detection and/or diagnosis of SARS-CoV-2 by FDA  under an Emergency Use Authorization (EUA).  This EUA will remain in effect (meaning this test can be used) for the duration of the COVID-19 declaration under Section 564(b)(1) of the Act, 21 U.S.C. section 360bbb-3(b)(1), unless the authorization is terminated or revoked sooner. Performed at [facility], [place] [address], [place],  [address]      Labs: Basic Metabolic Panel: Recent Labs  Lab 05/29/18 0239 05/30/18 0205 05/31/18 0213 06/01/18 0437 06/02/18 0256  NA 138 142 136 136 136  K 3.8 3.1* 4.3 4.9 4.4  CL 106 111 108 108 106  CO2 21* 20* 18* 18* 21*  GLUCOSE 126* 106* 120* 120* 126*  BUN 28* 33* 22 20 23   CREATININE 1.47* 1.64* 1.28* 1.32* 1.42*  CALCIUM 8.8* 8.5* 8.8* 9.2 9.1   Liver Function Tests: Recent Labs  Lab 05/28/18 1307  AST 17  ALT 14  ALKPHOS 84  BILITOT 1.0  PROT 7.0  ALBUMIN 3.5   Recent Labs  Lab 05/28/18 1307  LIPASE 40   No results for input(s): AMMONIA in the last 168 hours. CBC: Recent Labs  Lab 05/28/18 1307 05/29/18 0239 05/30/18 0205  05/31/18 0213 06/01/18 0437  WBC 9.5 5.5 5.3 6.5 7.3  HGB 12.5 11.1* 10.1* 10.9* 11.2*  HCT 38.7 33.9* 30.7* 33.4* 34.5*  MCV 86.2 84.5 85.8 85.9 85.8  PLT 198 180 158 166 179   Cardiac Enzymes: No results for input(s): CKTOTAL, CKMB, CKMBINDEX, TROPONINI in the last 168 hours. BNP: BNP (last 3 results) No results for input(s): BNP in the last 8760 hours.  ProBNP (last 3 results) No results for input(s): PROBNP in the last 8760 hours.  CBG: Recent Labs  Lab 06/01/18 1657 06/01/18 2022 06/01/18 2358 06/02/18 0416 06/02/18 0827  GLUCAP 128* 149* 127* 108* 117*       Signed:  Domenic Polite MD.  [facility] 06/02/2018, 10:07 AM

## 2018-06-02 NOTE — Plan of Care (Signed)

## 2018-06-02 NOTE — Progress Notes (Signed)
  Subjective: Patient is tolerating solid diet with good ileostomy output, no abdominal pain or cramps, bowels are moving. She is in good spirits and anticipating discharge home today.  Objective: Vital signs in last 24 hours: Temp:  [97.9 F (36.6 C)-99.1 F (37.3 C)] 97.9 F (36.6 C) (05/31 1031) Pulse Rate:  [60-86] 86 (05/31 1031) Resp:  [16-18] 16 (05/31 1031) BP: (134-162)/(63-80) 162/80 (05/31 1031) SpO2:  [98 %-100 %] 100 % (05/31 1031) Last BM Date: 06/02/18  Intake/Output from previous day: 05/30 0701 - 05/31 0700 In: 600 [P.O.:600] Out: 200 [Stool:200] Intake/Output this shift: Total I/O In: 240 [P.O.:240] Out: -    PE: Gen: Alert, NAD, pleasant Card: Regular rate and rhythm Pulm: Normal effort, clear to auscultation bilaterally Abd: Soft,non-tender,notclinically distended,+BS, ileostomy in LLQ withliquidstool and gas in pouch Skin: warm and dry, no rashes  Psych: A&Ox3   Lab Results:  Recent Labs    05/31/18 0213 06/01/18 0437  WBC 6.5 7.3  HGB 10.9* 11.2*  HCT 33.4* 34.5*  PLT 166 179   BMET Recent Labs    06/01/18 0437 06/02/18 0256  NA 136 136  K 4.9 4.4  CL 108 106  CO2 18* 21*  GLUCOSE 120* 126*  BUN 20 23  CREATININE 1.32* 1.42*  CALCIUM 9.2 9.1   PT/INR No results for input(s): LABPROT, INR in the last 72 hours. ABG No results for input(s): PHART, HCO3 in the last 72 hours.  Invalid input(s): PCO2, PO2  Studies/Results: No results found.  Anti-infectives: Anti-infectives (From admission, onward)   None      Assessment/Plan:    HTN T2DM CKD CHF Hx of gastric ulcers  Hx of total abdominal colectomy for UC > 30 years ago with ileostomy Hx of ileostomy revision  PSBO -Patienthas tolerated CLD, NGT removed 5/28 - advance to Soft and tolerated. -Patient's SBO has resolved.  She meets all criteria for discharge from a surgical standpoint. -Surgical follow-up is not indicated unless surgical problems  arise.  FEN:FLD, IVF... Discontinue at discharge VTE: SCDs, lovenox... Discontinue at discharge ID: no abx indicated   LOS: 5 days    Adin Hector 06/02/2018

## 2018-07-16 DIAGNOSIS — Z932 Ileostomy status: Secondary | ICD-10-CM | POA: Insufficient documentation

## 2018-10-01 DIAGNOSIS — I493 Ventricular premature depolarization: Secondary | ICD-10-CM | POA: Insufficient documentation

## 2019-07-16 DIAGNOSIS — R7989 Other specified abnormal findings of blood chemistry: Secondary | ICD-10-CM | POA: Insufficient documentation

## 2020-03-03 DIAGNOSIS — N760 Acute vaginitis: Secondary | ICD-10-CM | POA: Insufficient documentation

## 2020-08-04 IMAGING — CT CT ENTEROGRAPHY (ABD-PELV W/ CM)
2 of 6 series · 15 of 46 positions shown, 17 images · IV contrast (ISOVUE 300)
Comparison: 06/24/2017

Addendum:
CLINICAL DATA: Status post total colectomy 0144 for ulcerative
colitis. Nausea and vomiting.

EXAM:
CT ABDOMEN AND PELVIS WITH CONTRAST (ENTEROGRAPHY)
TECHNIQUE: Multidetector CT of the abdomen and pelvis during bolus
administration of intravenous contrast. Negative oral contrast was
given.
CONTRAST:  80mL WO7NSW-FPP IOPAMIDOL (WO7NSW-FPP) INJECTION 61%

[Series 4: entero thins · axial · 0.68mm/px · z∈[-411,-27]mm · 12 of 218 slices shown, 14 images]
[im 13/218  soft-tissue]
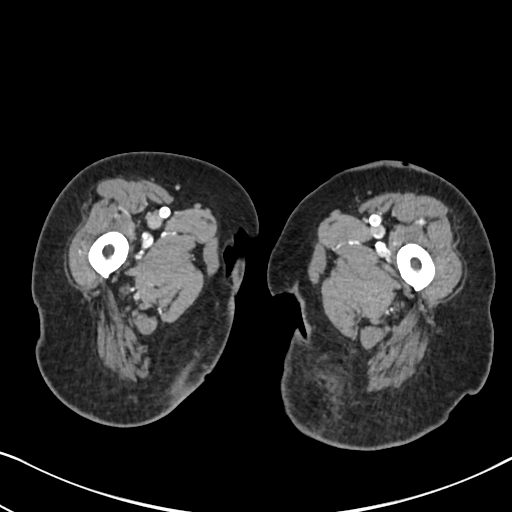
[im 13/218  bone]
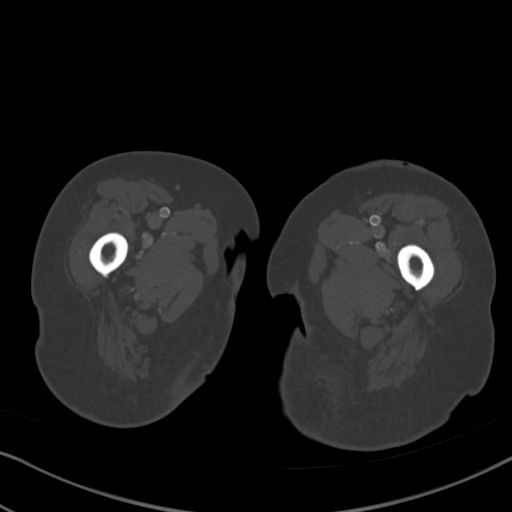
[im 37/218  soft-tissue]
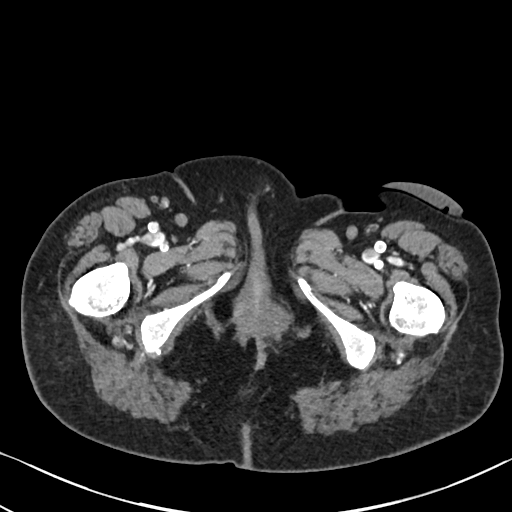
[im 49/218  soft-tissue]
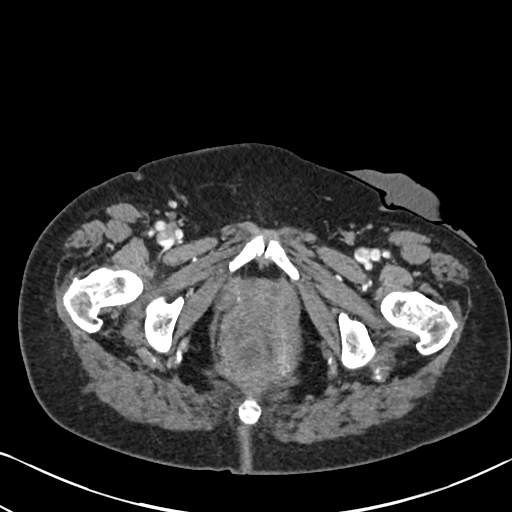
[im 61/218  soft-tissue]
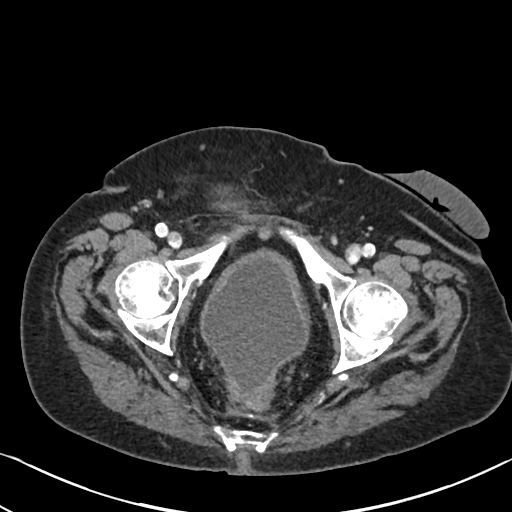
[im 85/218  soft-tissue]
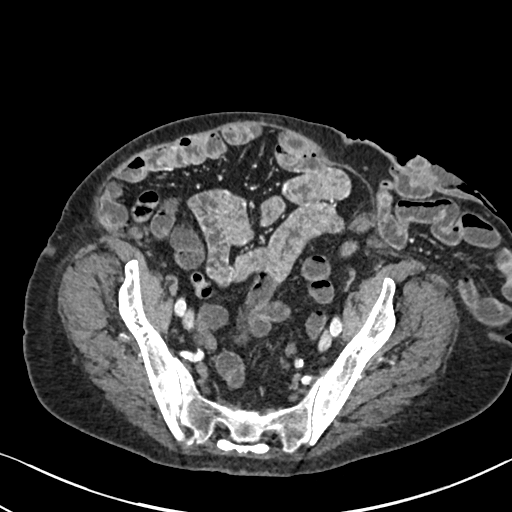
[im 97/218  soft-tissue]
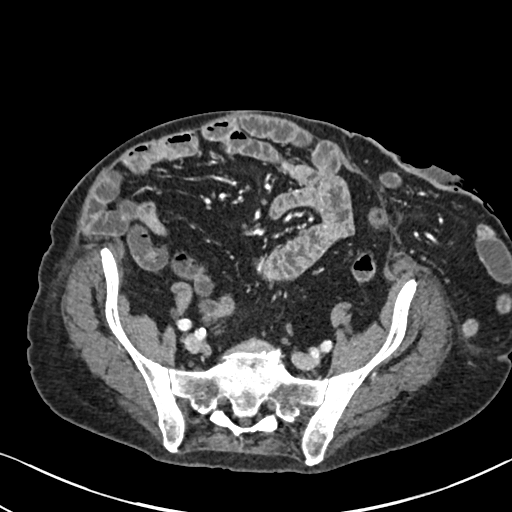
[im 121/218  soft-tissue]
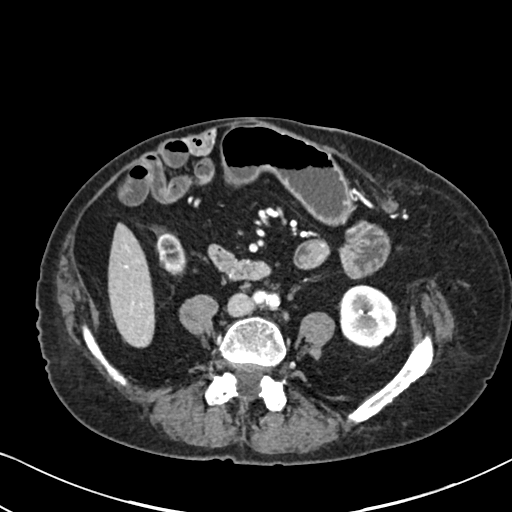
[im 133/218  soft-tissue]
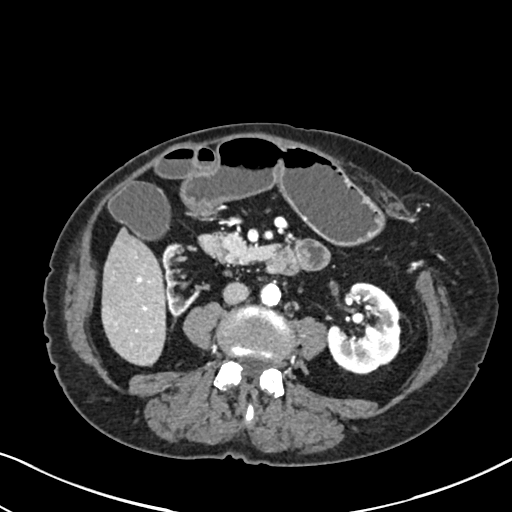
[im 157/218  soft-tissue]
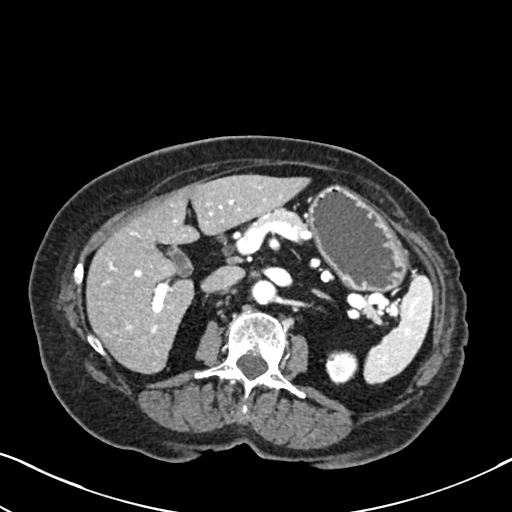
[im 157/218  bone]
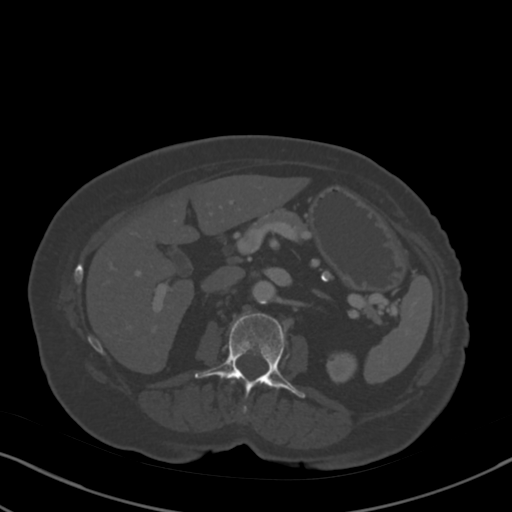
[im 169/218  soft-tissue]
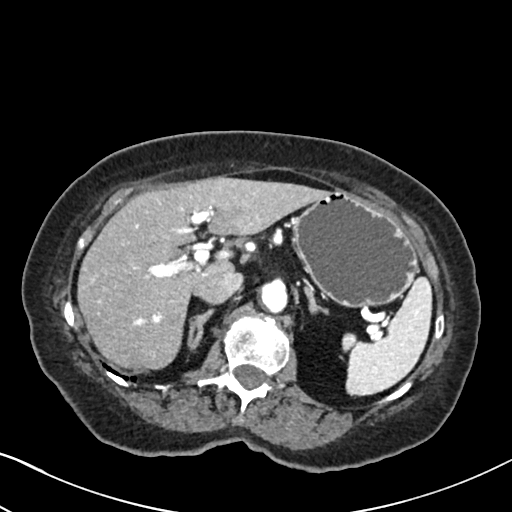
[im 181/218  soft-tissue]
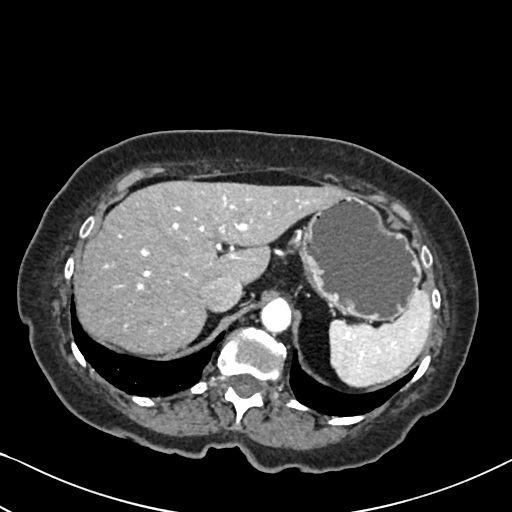
[im 205/218  soft-tissue]
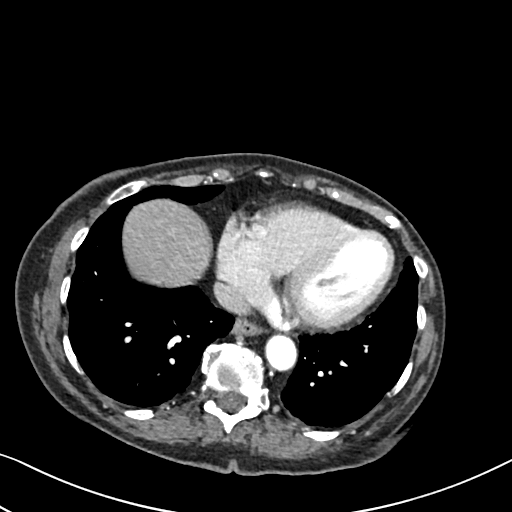

[Series 7: coronal · coronal · 0.73mm/px · 3 of 88 slices shown]
[im 30/88  soft-tissue]
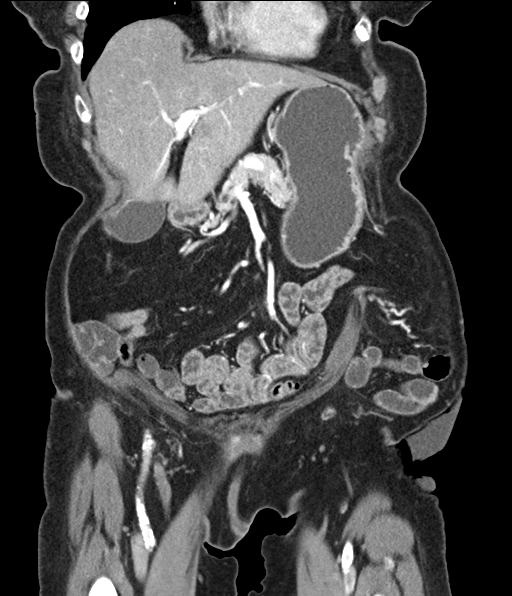
[im 39/88  soft-tissue]
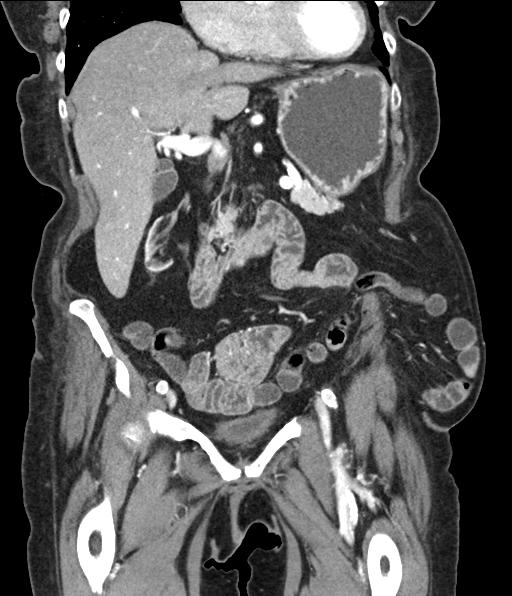
[im 49/88  soft-tissue]
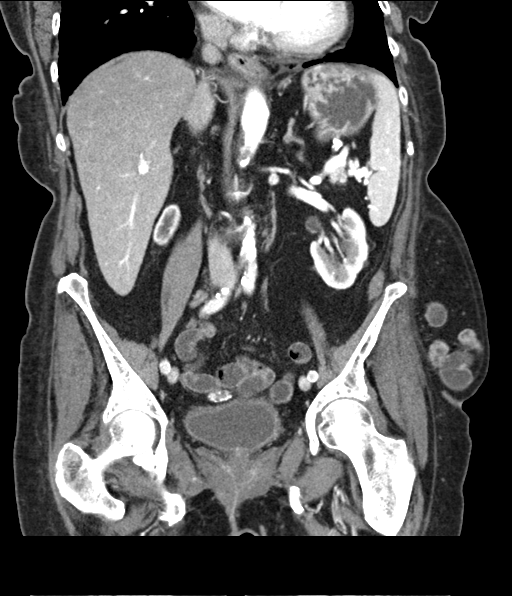

[15 of 46 positions shown; findings below may reference images not displayed]

FINDINGS: Lower chest:  Heart size upper normal to mildly increased.

Hepatobiliary: No focal abnormality within the liver parenchyma.
Gallbladder distended. No intrahepatic or extrahepatic biliary
dilation.

Pancreas: 8 mm 6 mm cystic focus identified uncinate process of the
pancreas. No dilatation of the main pancreatic duct.

Spleen: No splenomegaly. No focal mass lesion.

Adrenals/Urinary Tract: No adrenal nodule or mass. Right kidney
atrophic. Left kidney unremarkable. No evidence for hydroureter. The
urinary bladder appears normal for the degree of distention.

Stomach/Bowel: Stomach shows no evidence of wall thickening or
abnormal mural enhancement. The duodenum is normal. Small bowel
loops are nondilated. No small bowel wall thickening. No abnormal
enhancement in the small bowel. No CT E features to suggest
inflammatory or fibro stenotic small bowel stricture. Ileostomy
noted left lower quadrant.

Vascular/Lymphatic: There is abdominal aortic atherosclerosis
without aneurysm. There is no gastrohepatic or hepatoduodenal
ligament lymphadenopathy. No intraperitoneal or retroperitoneal
lymphadenopathy. No pelvic sidewall lymphadenopathy.

Reproductive: Uterus surgically absent.  There is no adnexal mass.

Other: No intraperitoneal free fluid.

Musculoskeletal: Large left parastomal hernia again noted containing
loops of small bowel without complicating features. No worrisome
lytic or sclerotic osseous abnormality.
IMPRESSION: 1. Status post colectomy. No abnormal wall thickening or enhancement
in the stomach or small bowel to suggest infectious/inflammatory
change. No evidence for inflammatory or fibro stenotic small bowel
stricture.
2. Atrophic right kidney.
3.  Aortic Atherosclerois (NBB32-170.0)
4. Similar appearance large left parastomal hernia containing small
bowel loops without complicating features.

ADDENDUM:
As mentioned in the body of the report, there is a 6 mm cystic focus
in the uncinate process of the pancreas. Repeat CT in 1 year
recommended as follow-up. This recommendation follows ACR consensus
guidelines: Management of Incidental Pancreatic Cysts: A White Paper
of the ACR Incidental Findings Committee. [HOSPITAL]

*** End of Addendum ***

## 2021-05-18 DIAGNOSIS — E872 Acidosis, unspecified: Secondary | ICD-10-CM | POA: Insufficient documentation

## 2022-03-01 DIAGNOSIS — N898 Other specified noninflammatory disorders of vagina: Secondary | ICD-10-CM | POA: Insufficient documentation

## 2022-12-22 ENCOUNTER — Ambulatory Visit: Payer: Medicare Other | Admitting: Nurse Practitioner

## 2023-01-17 ENCOUNTER — Ambulatory Visit: Payer: Medicare Other | Admitting: Nurse Practitioner

## 2023-01-17 ENCOUNTER — Ambulatory Visit (INDEPENDENT_AMBULATORY_CARE_PROVIDER_SITE_OTHER): Payer: HMO | Admitting: Nurse Practitioner

## 2023-01-17 VITALS — BP 118/80 | HR 55 | Temp 97.7°F | Ht 60.0 in | Wt 140.1 lb

## 2023-01-17 DIAGNOSIS — N1832 Chronic kidney disease, stage 3b: Secondary | ICD-10-CM | POA: Diagnosis not present

## 2023-01-17 DIAGNOSIS — E1122 Type 2 diabetes mellitus with diabetic chronic kidney disease: Secondary | ICD-10-CM

## 2023-01-17 LAB — COMPREHENSIVE METABOLIC PANEL
ALT: 11 U/L (ref 0–35)
AST: 16 U/L (ref 0–37)
Albumin: 4 g/dL (ref 3.5–5.2)
Alkaline Phosphatase: 75 U/L (ref 39–117)
BUN: 33 mg/dL — ABNORMAL HIGH (ref 6–23)
CO2: 22 meq/L (ref 19–32)
Calcium: 9 mg/dL (ref 8.4–10.5)
Chloride: 108 meq/L (ref 96–112)
Creatinine, Ser: 1.39 mg/dL — ABNORMAL HIGH (ref 0.40–1.20)
GFR: 35.33 mL/min — ABNORMAL LOW (ref >60.00)
Glucose, Bld: 107 mg/dL — ABNORMAL HIGH (ref 70–99)
Potassium: 4.7 meq/L (ref 3.5–5.1)
Sodium: 138 meq/L (ref 135–145)
Total Bilirubin: 0.5 mg/dL (ref 0.2–1.2)
Total Protein: 7 g/dL (ref 6.0–8.3)

## 2023-01-17 LAB — TSH: TSH: 3.05 u[IU]/mL (ref 0.35–5.50)

## 2023-01-17 LAB — LIPID PANEL
Cholesterol: 143 mg/dL (ref 0–200)
HDL: 38.8 mg/dL — ABNORMAL LOW (ref >39.00)
LDL Cholesterol: 59 mg/dL (ref 0–99)
NonHDL: 103.79
Total CHOL/HDL Ratio: 4
Triglycerides: 222 mg/dL — ABNORMAL HIGH (ref 0.0–149.0)
VLDL: 44.4 mg/dL — ABNORMAL HIGH (ref 0.0–40.0)

## 2023-01-17 LAB — CBC
HCT: 37.4 % (ref 36.0–46.0)
Hemoglobin: 12 g/dL (ref 12.0–15.0)
MCHC: 32.2 g/dL (ref 30.0–36.0)
MCV: 85.7 fL (ref 78.0–100.0)
Platelets: 169 10*3/uL (ref 150.0–400.0)
RBC: 4.36 Mil/uL (ref 3.87–5.11)
RDW: 16.2 % — ABNORMAL HIGH (ref 11.5–15.5)
WBC: 7.9 10*3/uL (ref 4.0–10.5)

## 2023-01-17 LAB — HEMOGLOBIN A1C: Hgb A1c MFr Bld: 8.4 % — ABNORMAL HIGH (ref 4.6–6.5)

## 2023-01-17 MED ORDER — INSULIN PEN NEEDLE 31G X 5 MM MISC: 1.0000 | Freq: Every day | 3 refills | Status: DC

## 2023-01-17 NOTE — Assessment & Plan Note (Signed)
 Chronic Check A1c today, further recommendations may be made based upon his results In the meantime patient was educated to continue Tresiba 12 units via subcutaneous injection nightly.  She feels comfortable with titrating Tresiba at home on her own until her next appoint with me.  I recommended that she check fasting blood sugar daily, and increase Tresiba by 2 units every 3 days if fasting blood sugars remain above 180.  She was told not to exceed Tresiba 18 units until she sees me for follow-up. She will also continue losartan  and atorvastatin .  Will try to see if we can get her scheduled in 2 weeks for close monitoring.  She was told if she experiences any hypoglycemia while making these adjustments that she can reach out to me sooner to discuss as well.

## 2023-01-17 NOTE — Assessment & Plan Note (Signed)
 Chronic Check labs today Patient to follow-up with nephrologist as scheduled We also discussed the importance of managing blood sugar, managing high blood pressure, avoiding excessive salt intake, getting adequate hydration with water, etc. in regards to maintaining kidney function.  She reports her understanding.

## 2023-01-17 NOTE — Patient Instructions (Addendum)
 Take tresiba 12 unites at bedtime every day.  Check your fasting blood sugar daily and keep a log of this. After 3 days if your fasting blood sugar is >180 then increase Tresiba to 14 units. You may continue to increase dose of Tresiba by 2 units every 3 days for fasting blood sugar >180, but do not exceed a dose of 18units of Tresiba until we meet again. Notify me if your blood sugars are consistently >250 or if you have multiple hypoglycemic events (blood sugar <80) between now and your next appointment.

## 2023-01-17 NOTE — Progress Notes (Signed)
 Established Patient Office Visit  Subjective   Patient ID: Angelica French, female    DOB: May 24, 1940  Age: 83 y.o. MRN: [id number]  Chief Complaint  Patient presents with   New Patient (Initial Visit)    Want to talk type diabetes 2, have never been insulin  dependent till 3 months a go      Patient has today to establish care.  Her main concern is diabetes.  Per chart review last A1c from September 2024 was 7.1.  She is currently on Tresiba subcutaneous injection nightly as monotherapy.  She also is on losartan  and atorvastatin .  She has chronic CKD stage IIIb, reports that she sees a nephrologist.  She reports that she only has 1 kidney that functions currently.  She has been taking [place] on her own sliding scale and has basically been guesstimating dose of needed [place] based on her evening blood sugar.  She denies any hypoglycemic events.    ROS: see HPI    Objective:     BP 118/80   Pulse (!) 55   Temp 97.7 F (36.5 C) (Temporal)   Ht 5' (1.524 m)   Wt 140 lb 2 oz (63.6 kg)   BMI 27.37 kg/m    Physical Exam Vitals reviewed.  Constitutional:      General: She is not in acute distress.    Appearance: Normal appearance.  HENT:     Head: Normocephalic and atraumatic.  Neck:     Vascular: No carotid bruit.  Cardiovascular:     Rate and Rhythm: Normal rate and regular rhythm.     Pulses: Normal pulses.     Heart sounds: Normal heart sounds.  Pulmonary:     Effort: Pulmonary effort is normal.     Breath sounds: Normal breath sounds.  Skin:    General: Skin is warm and dry.  Neurological:     General: No focal deficit present.     Mental Status: She is alert and oriented to person, place, and time.  Psychiatric:        Mood and Affect: Mood normal.        Behavior: Behavior normal.        Judgment: Judgment normal.      No results found for any visits on 01/17/23.    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  She feels comfortable with titrating Tresiba at home on her own until her next appoint with me.  I recommended that she check fasting blood sugar daily, and increase Tresiba by 2 units every 3 days if fasting blood sugars remain above 180.  She was told not to exceed Tresiba 18 units until she sees me for follow-up. She will also continue losartan  and atorvastatin .  Will try to see if we can get her scheduled in 2 weeks for close monitoring.  She was told if she experiences any hypoglycemia while making these adjustments that she can reach out to me sooner to discuss as  well.      Relevant Medications   insulin  degludec (TRESIBA) 100 UNIT/ML FlexTouch Pen   ONETOUCH VERIO test strip   glucose blood (ONETOUCH VERIO) test strip   Insulin  Pen Needle 31G X 5 MM MISC   Other Relevant Orders   CBC   Comprehensive metabolic panel   Hemoglobin A1c   Lipid panel   TSH   Microalbumin / creatinine urine ratio     Genitourinary   CKD (chronic kidney disease) stage 3, GFR 30-59 ml/min (HCC) - Primary (Chronic)   Chronic Check labs today Patient to follow-up with nephrologist as scheduled We also discussed the importance of managing blood sugar, managing high blood pressure, avoiding excessive salt intake, getting adequate hydration with water, etc. in regards to maintaining kidney function.  She reports her understanding.      Relevant Medications   insulin  degludec (TRESIBA) 100 UNIT/ML FlexTouch Pen   ONETOUCH VERIO test strip   glucose blood (ONETOUCH VERIO)  test strip   Insulin  Pen Needle 31G X 5 MM MISC   Other Relevant Orders   CBC   Comprehensive metabolic panel   Hemoglobin A1c   Lipid panel   TSH   Microalbumin / creatinine urine ratio    Return in about 2 weeks (around 01/31/2023) for F/U with Lila Lufkin allow 40 minute.    Zorita Hiss, NP

## 2023-01-18 ENCOUNTER — Other Ambulatory Visit: Payer: Self-pay | Admitting: Nurse Practitioner

## 2023-01-18 LAB — MICROALBUMIN / CREATININE URINE RATIO
Creatinine,U: 47.8 mg/dL
Microalb Creat Ratio: 3.4 mg/g (ref 0.0–30.0)
Microalb, Ur: 1.6 mg/dL (ref 0.0–1.9)

## 2023-02-09 ENCOUNTER — Ambulatory Visit (INDEPENDENT_AMBULATORY_CARE_PROVIDER_SITE_OTHER): Payer: HMO | Admitting: Nurse Practitioner

## 2023-02-09 VITALS — BP 122/78 | HR 60 | Temp 98.2°F | Ht 60.0 in | Wt 140.0 lb

## 2023-02-09 DIAGNOSIS — E782 Mixed hyperlipidemia: Secondary | ICD-10-CM | POA: Diagnosis not present

## 2023-02-09 DIAGNOSIS — E1122 Type 2 diabetes mellitus with diabetic chronic kidney disease: Secondary | ICD-10-CM | POA: Diagnosis not present

## 2023-02-09 DIAGNOSIS — J040 Acute laryngitis: Secondary | ICD-10-CM | POA: Diagnosis not present

## 2023-02-09 DIAGNOSIS — N1832 Chronic kidney disease, stage 3b: Secondary | ICD-10-CM | POA: Diagnosis not present

## 2023-02-09 DIAGNOSIS — Z794 Long term (current) use of insulin: Secondary | ICD-10-CM | POA: Diagnosis not present

## 2023-02-09 NOTE — Assessment & Plan Note (Signed)
 Chronic LDL at goal with atorvastatin  20 mg daily. 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  Thus she is declined to take fish oil supplementation.  We did discuss possibly increasing atorvastatin  dose, but patient would prefer not to do this as well.  At this time we will plan on just continuing atorvastatin  20 mg daily monitoring triglycerides periodically.

## 2023-02-09 NOTE — Progress Notes (Signed)
 Established Patient Office Visit  Subjective   Patient ID: Angelica French, female    DOB: [birth year]  Age: 83 y.o. MRN: [id number]  Chief Complaint  Patient presents with   Diabetes    Type 2 diabetes: Currently on Tresiba monotherapy.  Up to 14 units per SQ injection nightly.  Tolerating medication well.  Brings in at home blood sugar logs.  Majority of fasting blood sugars at goal (80-130) with some elevated outliers.  Postprandial levels approximately 50% of the time are at goal, other 50% of the time are above goal.  Patient reports she has not tolerated the toes in the past as a cause abdominal pain.  Patient does have chronic kidney disease and follows with nephrology (Dr. Carlette with [facility]), last EGFR 35.3 without significant albuminuria.  Patient does continue on losartan .  She is also on atorvastatin  20 mg daily and is tolerating this well, last LDL 59 with elevated triglycerides at 222 (this was fasting).  Patient reports history of elevated triglycerides greater than 500 in the past.  She also reports that since things giving she has had some odynophagia.  This is intermittent, usually will happen at night but not every night.  No obvious patterns or triggers identified by patient.  She also reports having lost her voice intermittently at times, this will occur for about 2 to 5 minutes before it resolved spontaneously.    ROS: see HPI    Objective:     BP 122/78   Pulse 60   Temp 98.2 F (36.8 C) (Temporal)   Ht 5' (1.524 m)   Wt 140 lb (63.5 kg)   SpO2 99%   BMI 27.34 kg/m    Physical Exam Vitals reviewed.  Constitutional:      General: She is not in acute distress.    Appearance: Normal appearance.  HENT:     Head: Normocephalic and atraumatic.     Mouth/Throat:     Lips: Pink.     Mouth: Mucous membranes are moist.  Neck:     Vascular: No carotid bruit.  Cardiovascular:     Rate and Rhythm: Normal rate and regular rhythm.     Pulses: Normal  pulses.     Heart sounds: Normal heart sounds.  Pulmonary:     Effort: Pulmonary effort is normal.     Breath sounds: Normal breath sounds.  Lymphadenopathy:     Cervical: No cervical adenopathy.  Skin:    General: Skin is warm and dry.  Neurological:     General: No focal deficit present.     Mental Status: She is alert and oriented to person, place, and time.  Psychiatric:        Mood and Affect: Mood normal.        Behavior: Behavior normal.        Judgment: Judgment normal.      No results found for any visits on 02/09/23.    The ASCVD Risk score (Arnett DK, et al., 2019) failed to calculate for the following reasons:   The 2019 ASCVD risk score is only valid for ages 90 to 3    Assessment & Plan:   Problem List Items Addressed This Visit       Respiratory   Laryngitis   Subacute, intermittent Associated with intermittent odynophagia. Recommended ENT versus GI referral, but patient declined at this time.  She would prefer to monitor and if symptoms worsen or do not improve between now and next appointment  will let me know.        Endocrine   Type 2 diabetes mellitus with diabetic chronic kidney disease (HCC) - Primary (Chronic)   Chronic Last A1c above goal at 8.3, I think A1c less than 8 is an appropriate goal for her based on her age Recommend referral to endocrinology as metformin would not be an optimal agent to help with postprandial levels based on her GFR, she is not on SGLT2 but based on GFR I do not feel this would help much with her blood sugar control either, does not tolerate GLP-1 due to history of abdominal discomfort, I prefer to avoid sulfonylureas due to risk of hypoglycemia especially in conjunction with insulin  use.  Per shared decision making we will trial increasing Tresiba up to 16 units daily with close follow-up in 2 to 4 weeks.  I also recommend referral to endocrinology for assistance with managing her diabetes.  Patient is agreeable and  referral was ordered today. Continue atorvastatin  and losartan .      Relevant Orders   Ambulatory referral to Endocrinology     Other   Mixed hyperlipidemia   Chronic LDL at goal with atorvastatin  20 mg daily. 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  Thus she is declined to take fish oil supplementation.  We did discuss possibly increasing atorvastatin  dose, but patient would prefer not to do this as well.  At this time we will plan on just continuing atorvastatin  20 mg daily monitoring triglycerides periodically.       Return in about 2 weeks (around 02/23/2023) for F/U with Lauraine.    Lauraine FORBES Pereyra, NP

## 2023-02-09 NOTE — Assessment & Plan Note (Signed)
 Subacute, intermittent Associated with intermittent odynophagia. Recommended ENT versus GI referral, but patient declined at this time.  She would prefer to monitor and if symptoms worsen or do not improve between now and next appointment will let me know.

## 2023-02-09 NOTE — Assessment & Plan Note (Signed)
 Chronic Last A1c above goal at 8.3, I think A1c less than 8 is an appropriate goal for her based on her age Recommend referral to endocrinology as metformin would not be an optimal agent to help with postprandial levels based on her GFR, she is not on SGLT2 but based on GFR I do not feel this would help much with her blood sugar control either, does not tolerate GLP-1 due to history of abdominal discomfort, I prefer to avoid sulfonylureas due to risk of hypoglycemia especially in conjunction with insulin  use.  Per shared decision making we will trial increasing Tresiba up to 16 units daily with close follow-up in 2 to 4 weeks.  I also recommend referral to endocrinology for assistance with managing her diabetes.  Patient is agreeable and referral was ordered today. Continue atorvastatin  and losartan .

## 2023-02-23 ENCOUNTER — Ambulatory Visit: Payer: HMO | Admitting: Nurse Practitioner

## 2023-02-23 VITALS — BP 112/76 | HR 54 | Temp 97.6°F | Ht 60.0 in | Wt 143.0 lb

## 2023-02-23 DIAGNOSIS — Z794 Long term (current) use of insulin: Secondary | ICD-10-CM

## 2023-02-23 DIAGNOSIS — E782 Mixed hyperlipidemia: Secondary | ICD-10-CM

## 2023-02-23 DIAGNOSIS — N1832 Chronic kidney disease, stage 3b: Secondary | ICD-10-CM

## 2023-02-23 DIAGNOSIS — W19XXXA Unspecified fall, initial encounter: Secondary | ICD-10-CM

## 2023-02-23 DIAGNOSIS — E1122 Type 2 diabetes mellitus with diabetic chronic kidney disease: Secondary | ICD-10-CM

## 2023-02-23 MED ORDER — ATORVASTATIN CALCIUM 20 MG PO TABS: 20.0000 mg | ORAL_TABLET | Freq: Every day | ORAL | 3 refills | Status: DC

## 2023-02-23 NOTE — Assessment & Plan Note (Signed)
Acute, occurred 5 days ago No neurologic signs or symptoms.  Patient is not on a blood thinner. Per shared decision making we will hold off on imaging of head, but we discussed ER precautions.  She reports understanding.

## 2023-02-23 NOTE — Assessment & Plan Note (Signed)
Chronic Continue atorvastatin 20 g daily, refill sent to pharmacy.

## 2023-02-23 NOTE — Progress Notes (Signed)
Established Patient Office Visit  Subjective   Patient ID: Angelica French, female    DOB: 06/16/1940  Age: 83 y.o. MRN: [id number]  Chief Complaint  Patient presents with   Diabetes    T2DM/HLD: Increased tresiba at last office visit to 16 units/day.  She brings at home blood sugar logs for review today.  All fasting blood sugar levels within goal, has about 50% of bedtime levels under 180.  No hypoglycemic events.  Overall feeling well.  Takes atorvastatin 20 mg daily, requesting refill today.  Fall: Reports she had a fall 5 days ago at the grocery store.  Her foot got caught and she fell forward on her face.  Does have sore scalp and right orbit, but no significant pain.  Also denies headache, nausea, vomiting, confusion, blurry vision.    ROS: see HPI    Objective:     BP 112/76   Pulse (!) 54   Temp 97.6 F (36.4 C) (Temporal)   Ht 5' (1.524 m)   Wt 143 lb (64.9 kg)   SpO2 95%   BMI 27.93 kg/m    Physical Exam Vitals reviewed.  Constitutional:      General: She is not in acute distress.    Appearance: Normal appearance.  HENT:     Head: Normocephalic and atraumatic.  Neck:     Vascular: No carotid bruit.  Cardiovascular:     Rate and Rhythm: Normal rate and regular rhythm.     Pulses: Normal pulses.     Heart sounds: Normal heart sounds.  Pulmonary:     Effort: Pulmonary effort is normal.     Breath sounds: Normal breath sounds.  Skin:    General: Skin is warm and dry.  Neurological:     General: No focal deficit present.     Mental Status: She is alert and oriented to person, place, and time.  Psychiatric:        Mood and Affect: Mood normal.        Behavior: Behavior normal.        Judgment: Judgment normal.      No results found for any visits on 02/23/23.    The ASCVD Risk score (Arnett DK, et al., 2019) failed to calculate for the following reasons:   The 2019 ASCVD risk score is only valid for ages 79 to 46    Assessment & Plan:    Problem List Items Addressed This Visit       Endocrine   Type 2 diabetes mellitus with diabetic chronic kidney disease (HCC) - Primary (Chronic)   Chronic Blood sugars are improving however bedtime levels are still above goal.  Will increase Tresiba to 18 units subcutaneous injection once a day.  Patient to follow-up in 2 months, she was encouraged to reduce dose back to 16 units subcutaneous injection once a day if she experiences 2 or more hypoglycemic events between now and next office visit.  She reports understanding.  Continue atorvastatin 20 mg daily, refill sent to pharmacy.      Relevant Medications   atorvastatin (LIPITOR) 20 MG tablet     Other   Mixed hyperlipidemia   Chronic Continue atorvastatin 20 g daily, refill sent to pharmacy.      Relevant Medications   atorvastatin (LIPITOR) 20 MG tablet   Fall   Acute, occurred 5 days ago No neurologic signs or symptoms.  Patient is not on a blood thinner. Per shared decision making we will hold off  on imaging of head, but we discussed ER precautions.  She reports understanding.       Return in about 2 months (around 04/23/2023) for F/U with Maralyn Sago.    Elenore Paddy, NP

## 2023-02-23 NOTE — Assessment & Plan Note (Signed)
Chronic Blood sugars are improving however bedtime levels are still above goal.  Will increase Tresiba to 18 units subcutaneous injection once a day.  Patient to follow-up in 2 months, she was encouraged to reduce dose back to 16 units subcutaneous injection once a day if she experiences 2 or more hypoglycemic events between now and next office visit.  She reports understanding.  Continue atorvastatin 20 mg daily, refill sent to pharmacy.

## 2023-03-02 DIAGNOSIS — Z932 Ileostomy status: Secondary | ICD-10-CM | POA: Diagnosis not present

## 2023-03-23 ENCOUNTER — Encounter: Payer: Self-pay | Admitting: Nurse Practitioner

## 2023-03-28 DIAGNOSIS — N1832 Chronic kidney disease, stage 3b: Secondary | ICD-10-CM | POA: Diagnosis not present

## 2023-04-02 DIAGNOSIS — I129 Hypertensive chronic kidney disease with stage 1 through stage 4 chronic kidney disease, or unspecified chronic kidney disease: Secondary | ICD-10-CM | POA: Diagnosis not present

## 2023-04-02 DIAGNOSIS — E1122 Type 2 diabetes mellitus with diabetic chronic kidney disease: Secondary | ICD-10-CM | POA: Diagnosis not present

## 2023-04-02 DIAGNOSIS — N1832 Chronic kidney disease, stage 3b: Secondary | ICD-10-CM | POA: Diagnosis not present

## 2023-04-05 ENCOUNTER — Encounter: Payer: Self-pay | Admitting: Nurse Practitioner

## 2023-04-25 ENCOUNTER — Ambulatory Visit (INDEPENDENT_AMBULATORY_CARE_PROVIDER_SITE_OTHER): Payer: HMO | Admitting: Nurse Practitioner

## 2023-04-25 VITALS — BP 134/86 | HR 56 | Temp 97.8°F | Ht 60.0 in | Wt 141.5 lb

## 2023-04-25 DIAGNOSIS — E1122 Type 2 diabetes mellitus with diabetic chronic kidney disease: Secondary | ICD-10-CM | POA: Diagnosis not present

## 2023-04-25 DIAGNOSIS — Z794 Long term (current) use of insulin: Secondary | ICD-10-CM | POA: Diagnosis not present

## 2023-04-25 DIAGNOSIS — Z13820 Encounter for screening for osteoporosis: Secondary | ICD-10-CM | POA: Diagnosis not present

## 2023-04-25 DIAGNOSIS — N1832 Chronic kidney disease, stage 3b: Secondary | ICD-10-CM | POA: Diagnosis not present

## 2023-04-25 MED ORDER — INSULIN PEN NEEDLE 31G X 5 MM MISC: 1.0000 | Freq: Every day | 4 refills | Status: DC

## 2023-04-25 MED ORDER — PRAVASTATIN SODIUM 10 MG PO TABS: 10.0000 mg | ORAL_TABLET | Freq: Every day | ORAL | 1 refills | Status: DC

## 2023-04-25 NOTE — Telephone Encounter (Signed)
 Pt will be seeing provider today 04/25/23

## 2023-04-25 NOTE — Assessment & Plan Note (Signed)
 Chronic Follow-up with nephrology as scheduled Continue losartan  100 mg daily Start pravastatin  10 mg daily, discontinue atorvastatin .

## 2023-04-25 NOTE — Assessment & Plan Note (Addendum)
 Chronic At home blood sugar readings much improved. For now continue on Tresiba 18 units daily. Patient to follow-up in 1 month at which point we will check A1c. Will refer patient to Dr. Horris Lynn per her request to help manage diabetes. Patient to also continue losartan  100 mg daily, discontinue atorvastatin  and start pravastatin .  Plan to also check lipid panel and metabolic panel at follow-up in 1 month.

## 2023-04-25 NOTE — Assessment & Plan Note (Signed)
 DEXA scan ordered for further evaluation, further recommendations may be made based upon the results.

## 2023-04-25 NOTE — Progress Notes (Signed)
 Established Patient Office Visit  Subjective   Patient ID: Angelica French, female    DOB: [birth year]  Age: 83 y.o. MRN: [id number]  Chief Complaint  Patient presents with   Medical Management of Chronic Issues    2 month follow up, hip pain and blood sugar     Type 2 diabetes/hyperlipidemia: Last A1c 8.4.  She has comorbid chronic kidney disease as well.  Referred to endocrinology but would like to see a different endocrinologist than who she was referred to.  Diabetes treated with Tresiba 18 units at bedtime.  Was experiencing significant myalgias from atorvastatin  so she discontinued this.  Would like to discuss trying a different statin.  Right hip pain: Chronic and likely related to arthritis.  She reports family history of osteoporosis.  Reports last DEXA scan probably 4 years ago, would like further evaluation today.      ROS: see HPI    Objective:     BP 134/86   Pulse (!) 56   Temp 97.8 F (36.6 C) (Temporal)   Ht 5' (1.524 m)   Wt 141 lb 8 oz (64.2 kg)   SpO2 95%   BMI 27.63 kg/m    Physical Exam Vitals reviewed.  Constitutional:      General: She is not in acute distress.    Appearance: Normal appearance.  HENT:     Head: Normocephalic and atraumatic.  Cardiovascular:     Rate and Rhythm: Normal rate and regular rhythm.     Pulses: Normal pulses.     Heart sounds: Normal heart sounds.  Pulmonary:     Effort: Pulmonary effort is normal.     Breath sounds: Normal breath sounds.  Skin:    General: Skin is warm and dry.  Neurological:     General: No focal deficit present.     Mental Status: She is alert and oriented to person, place, and time.  Psychiatric:        Mood and Affect: Mood normal.        Behavior: Behavior normal.        Judgment: Judgment normal.      No results found for any visits on 04/25/23.    The ASCVD Risk score (Arnett DK, et al., 2019) failed to calculate for the following reasons:   The 2019 ASCVD risk score is  only valid for ages 27 to 43    Assessment & Plan:   Problem List Items Addressed This Visit       Endocrine   Type 2 diabetes mellitus with diabetic chronic kidney disease (HCC) - Primary (Chronic)   Chronic At home blood sugar readings much improved. For now continue on Tresiba 18 units daily. Patient to follow-up in 1 month at which point we will check A1c. Will refer patient to Dr. Horris Lynn per her request to help manage diabetes. Patient to also continue losartan  100 mg daily, discontinue atorvastatin  and start pravastatin .  Plan to also check lipid panel and metabolic panel at follow-up in 1 month.      Relevant Medications   pravastatin  (PRAVACHOL ) 10 MG tablet   Insulin  Pen Needle 31G X 5 MM MISC   Other Relevant Orders   Ambulatory referral to Endocrinology     Genitourinary   CKD (chronic kidney disease) stage 3, GFR 30-59 ml/min (HCC) (Chronic)   Chronic Follow-up with nephrology as scheduled Continue losartan  100 mg daily Start pravastatin  10 mg daily, discontinue atorvastatin .      Relevant Medications  Insulin  Pen Needle 31G X 5 MM MISC     Other   Osteoporosis screening   DEXA scan ordered for further evaluation, further recommendations may be made based upon the results.      Relevant Orders   DG Bone Density    Return in about 6 weeks (around 06/06/2023) for F/U with James Lafalce, for HLD and DM.    Zorita Hiss, NP

## 2023-04-26 ENCOUNTER — Ambulatory Visit (INDEPENDENT_AMBULATORY_CARE_PROVIDER_SITE_OTHER)
Admission: RE | Admit: 2023-04-26 | Discharge: 2023-04-26 | Disposition: A | Discharge status: Home / Self Care | Source: Ambulatory Visit | Attending: Nurse Practitioner | Admitting: Nurse Practitioner

## 2023-04-26 ENCOUNTER — Other Ambulatory Visit: Payer: Self-pay | Admitting: Nurse Practitioner

## 2023-04-26 DIAGNOSIS — Z13820 Encounter for screening for osteoporosis: Secondary | ICD-10-CM

## 2023-05-03 ENCOUNTER — Other Ambulatory Visit: Payer: Self-pay | Admitting: Nurse Practitioner

## 2023-05-03 DIAGNOSIS — M8580 Other specified disorders of bone density and structure, unspecified site: Secondary | ICD-10-CM

## 2023-05-03 DIAGNOSIS — E1122 Type 2 diabetes mellitus with diabetic chronic kidney disease: Secondary | ICD-10-CM

## 2023-05-22 DIAGNOSIS — Z932 Ileostomy status: Secondary | ICD-10-CM | POA: Diagnosis not present

## 2023-05-25 ENCOUNTER — Ambulatory Visit (INDEPENDENT_AMBULATORY_CARE_PROVIDER_SITE_OTHER): Admitting: Nurse Practitioner

## 2023-05-25 ENCOUNTER — Ambulatory Visit: Payer: Self-pay | Admitting: Nurse Practitioner

## 2023-05-25 VITALS — BP 138/84 | HR 57 | Temp 97.9°F | Ht 60.0 in | Wt 143.1 lb

## 2023-05-25 DIAGNOSIS — T466X5A Adverse effect of antihyperlipidemic and antiarteriosclerotic drugs, initial encounter: Secondary | ICD-10-CM | POA: Diagnosis not present

## 2023-05-25 DIAGNOSIS — M8580 Other specified disorders of bone density and structure, unspecified site: Secondary | ICD-10-CM | POA: Insufficient documentation

## 2023-05-25 DIAGNOSIS — N1832 Chronic kidney disease, stage 3b: Secondary | ICD-10-CM

## 2023-05-25 DIAGNOSIS — E1122 Type 2 diabetes mellitus with diabetic chronic kidney disease: Secondary | ICD-10-CM

## 2023-05-25 DIAGNOSIS — Z558 Other problems related to education and literacy: Secondary | ICD-10-CM

## 2023-05-25 DIAGNOSIS — R490 Dysphonia: Secondary | ICD-10-CM | POA: Insufficient documentation

## 2023-05-25 DIAGNOSIS — E782 Mixed hyperlipidemia: Secondary | ICD-10-CM | POA: Diagnosis not present

## 2023-05-25 DIAGNOSIS — G720 Drug-induced myopathy: Secondary | ICD-10-CM | POA: Diagnosis not present

## 2023-05-25 LAB — BASIC METABOLIC PANEL WITH GFR
BUN: 42 mg/dL — ABNORMAL HIGH (ref 6–23)
CO2: 22 meq/L (ref 19–32)
Calcium: 9.4 mg/dL (ref 8.4–10.5)
Chloride: 108 meq/L (ref 96–112)
Creatinine, Ser: 1.47 mg/dL — ABNORMAL HIGH (ref 0.40–1.20)
GFR: 32.95 mL/min — ABNORMAL LOW (ref >60.00)
Glucose, Bld: 84 mg/dL (ref 70–99)
Potassium: 4.7 meq/L (ref 3.5–5.1)
Sodium: 140 meq/L (ref 135–145)

## 2023-05-25 LAB — HEMOGLOBIN A1C: Hgb A1c MFr Bld: 7.5 % — ABNORMAL HIGH (ref 4.6–6.5)

## 2023-05-25 MED ORDER — EZETIMIBE 10 MG PO TABS: 10.0000 mg | ORAL_TABLET | Freq: Every day | ORAL | 1 refills | Status: DC

## 2023-05-25 NOTE — Assessment & Plan Note (Signed)
 Discontinue statins, start Zetia.  Follow-up in 4 months to check labs.  Referral to clinical pharmacist also ordered to help assist with hyperlipidemia.

## 2023-05-25 NOTE — Assessment & Plan Note (Signed)
 Chronic Check A1c Continue Tresiba 18 units daily, continue losartan  100 mg daily Will try Zetia as she has not tolerated statin therapy thus far. Will refer to endocrinology but also to our pharmacist to help assist with management of her diabetes and hyperlipidemia.

## 2023-05-25 NOTE — Assessment & Plan Note (Signed)
 Chronic Does not tolerate statin therapy Will trial Zetia Referral to clinical pharmacist to assist with management of hyperlipidemia

## 2023-05-25 NOTE — Progress Notes (Signed)
 Established Patient Office Visit  Subjective   Patient ID: Angelica French, female    DOB: 1940-08-28  Age: 83 y.o. MRN: [id number]  Chief Complaint  Patient presents with   Diabetes    T2DM/HLD: Type 2 diabetes/hyperlipidemia: Last A1c 8.4.  She has comorbid chronic kidney disease as well.  Diabetes treated with Tresiba 18 units at bedtime.  Was experiencing significant myalgias from atorvastatin  so she discontinued this.  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  Was referred to endocrinology but is having a hard time getting into their office so would like referral to [facility] endocrinology.  Currently on Tresiba 18 units daily, losartan  100 mg daily.  Osteopenia: Identified on last bone scan.  Does have high risk of fracture.  Would like to go to osteoporosis clinic for treatment recommendations.  Hoarseness, neck discomfort: She continues to have intermittent hoarseness as well as discomfort on the right side of her neck.  Almost feels like a swelling sensation.  She would like to see ENT.  Overall today feels well.        Objective:     BP 138/84   Pulse (!) 57   Temp 97.9 F (36.6 C) (Temporal)   Ht 5' (1.524 m)   Wt 143 lb 2 oz (64.9 kg)   SpO2 97%   BMI 27.95 kg/m  BP Readings from Last 3 Encounters:  05/25/23 138/84  04/25/23 134/86  02/23/23 112/76   Wt Readings from Last 3 Encounters:  05/25/23 143 lb 2 oz (64.9 kg)  04/25/23 141 lb 8 oz (64.2 kg)  02/23/23 143 lb (64.9 kg)     Diabetic Foot Exam - Simple   Simple Foot Form Diabetic Foot exam was performed with the following findings: Yes 05/25/2023  2:40 PM  Visual Inspection No deformities, no ulcerations, no other skin breakdown bilaterally: Yes Sensation Testing Intact to touch and monofilament testing bilaterally: Yes Pulse Check Posterior Tibialis and Dorsalis pulse intact bilaterally: Yes Comments      Physical Exam Vitals reviewed.  Constitutional:       General: She is not in acute distress.    Appearance: Normal appearance.  HENT:     Head: Normocephalic and atraumatic.  Neck:     Vascular: No carotid bruit.  Cardiovascular:     Rate and Rhythm: Normal rate and regular rhythm.     Pulses: Normal pulses.     Heart sounds: Normal heart sounds.  Pulmonary:     Effort: Pulmonary effort is normal.     Breath sounds: Normal breath sounds.  Skin:    General: Skin is warm and dry.  Neurological:     General: No focal deficit present.     Mental Status: She is alert and oriented to person, place, and time.  Psychiatric:        Mood and Affect: Mood normal.        Behavior: Behavior normal.        Judgment: Judgment normal.      No results found for any visits on 05/25/23.    The ASCVD Risk score (Arnett DK, et al., 2019) failed to calculate for the following reasons:   The 2019 ASCVD risk score is only valid for ages 32 to 34    Assessment & Plan:   Problem List Items Addressed This Visit       Endocrine   Type 2 diabetes mellitus with diabetic chronic kidney disease (HCC) - Primary (Chronic)  Chronic Check A1c Continue Tresiba 18 units daily, continue losartan  100 mg daily Will try Zetia as she has not tolerated statin therapy thus far. Will refer to endocrinology but also to our pharmacist to help assist with management of her diabetes and hyperlipidemia.      Relevant Orders   Ambulatory referral to Endocrinology   Hemoglobin A1c   AMB Referral [facility] Care Management   Basic metabolic panel with GFR     Musculoskeletal and Integument   Statin myopathy   Discontinue statins, start Zetia.  Follow-up in 4 months to check labs.  Referral to clinical pharmacist also ordered to help assist with hyperlipidemia.      Osteopenia   Chronic Refer to osteoporosis clinic      Relevant Orders   Ambulatory referral to Orthopedic Surgery     Other   Mixed hyperlipidemia   Chronic Does not tolerate statin therapy Will  trial Zetia Referral to clinical pharmacist to assist with management of hyperlipidemia       Relevant Medications   ezetimibe (ZETIA) 10 MG tablet   Other Relevant Orders   AMB Referral [facility] Care Management   Basic metabolic panel with GFR   Hoarseness   Chronic Referral to ENT made today      Relevant Orders   Ambulatory referral to ENT    Return in about 4 months (around 09/25/2023) for F/U with Cledith Abdou.    Zorita Hiss, NP

## 2023-05-25 NOTE — Assessment & Plan Note (Signed)
 Chronic Refer to osteoporosis clinic

## 2023-05-25 NOTE — Assessment & Plan Note (Signed)
 Chronic Referral to ENT made today

## 2023-06-07 ENCOUNTER — Ambulatory Visit: Admitting: Nurse Practitioner

## 2023-06-08 ENCOUNTER — Telehealth: Payer: Self-pay | Admitting: *Deleted

## 2023-06-08 NOTE — Progress Notes (Signed)
 Care Guide Pharmacy Note  06/08/2023 Name: EVALISE ABRUZZESE MRN: [id number] DOB: February 05, 1940  Referred By: Zorita Hiss, NP Reason for referral: Complex Care Management and Call Attempt #1 (Outreach to schedule referral with pharmacist )   THRESIA RAMANATHAN is a 83 y.o. year old female who is a primary care patient of Zorita Hiss, NP.  YLIANA GRAVOIS was referred to the pharmacist for assistance related to: HLD and DMII  An unsuccessful telephone outreach was attempted today to contact the patient who was referred to the pharmacy team for assistance with medication management. Additional attempts will be made to contact the patient.  Kandis Ormond, CMA [place]  [facility], [facility] Guide Direct Dial: [phone]  Fax: [phone] Website: [email or web address]

## 2023-06-11 NOTE — Progress Notes (Signed)
 Care Guide Pharmacy Note  06/11/2023 Name: Angelica French MRN: [id number] DOB: 1940-12-26  Referred By: Zorita Hiss, NP Reason for referral: Complex Care Management and Call Attempt #1 (Outreach to schedule referral with pharmacist )   Angelica French is a 83 y.o. year old female who is a primary care patient of Zorita Hiss, NP.  Angelica French was referred to the pharmacist for assistance related to: HTN and DMII  Successful contact was made with the patient to discuss pharmacy services including being ready for the pharmacist to call at least 5 minutes before the scheduled appointment time and to have medication bottles and any blood pressure readings ready for review. The patient agreed to meet with the pharmacist via telephone visit on 06/19/2023  Kandis Ormond, CMA [place]  [facility], [facility] Guide Direct Dial: [phone]  Fax: [phone] Website: [email or web address]

## 2023-06-19 ENCOUNTER — Telehealth: Payer: Self-pay

## 2023-06-19 ENCOUNTER — Other Ambulatory Visit: Admitting: Pharmacist

## 2023-06-19 DIAGNOSIS — E1122 Type 2 diabetes mellitus with diabetic chronic kidney disease: Secondary | ICD-10-CM

## 2023-06-19 MED ORDER — TIRZEPATIDE 2.5 MG/0.5ML ~~LOC~~ SOAJ: 2.5000 mg | SUBCUTANEOUS | 0 refills | Status: DC

## 2023-06-19 NOTE — Telephone Encounter (Signed)
 Pharmacy Patient Advocate Encounter   Received notification from CoverMyMeds that prior authorization for  Mounjaro 2.5MG /0.5ML auto-injectors is required/requested.   Insurance verification completed.   The patient is insured through [facility] ADVANTAGE/RX ADVANCE .   Per test claim: PA required; PA submitted to above mentioned insurance via CoverMyMeds Key/confirmation #/EOC [id number] Status is pending

## 2023-06-19 NOTE — Patient Instructions (Signed)
 It was a pleasure speaking with you today!  Start Mounjaro 2.5 mg once weekly and decrease Tresiba to 10 units daily.   Monitor for side effects such as nausea, vomiting, diarrhea, constipation, and low blood sugars.   Feel free to call with any questions or concerns!  Rainelle Bur, PharmD, BCPS, CPP Clinical Pharmacist Practitioner [place] [facility] at [facility] [phone]

## 2023-06-19 NOTE — Progress Notes (Signed)
 06/19/2023 Name: Angelica French MRN: [id number] DOB: Sep 07, 1940  Chief Complaint  Patient presents with   Diabetes   Medication Management    Angelica French is a 83 y.o. year old female who presented for a telephone visit.   They were referred to the pharmacist by their PCP for assistance in managing diabetes.   Subjective:  Care Team: Primary Care Provider: Zorita Hiss, NP ; Next Scheduled Visit: 09/27/23   Medication Access/Adherence  Current Pharmacy:  [id number] - [place], [place] - [address] [place] [address] Phone: [phone] Fax: [phone]   Patient reports affordability concerns with their medications: Yes  Patient reports access/transportation concerns to their pharmacy: No  Patient reports adherence concerns with their medications:  No     Diabetes:  Current medications: Tresiba 18 units daily Medications tried in the past: Victoza  (had diarrhea/GI upset), metformin (diarrhea)  *Pt notes she would like to try Mounjaro however is concerned she is unable to afford. She notes she has gained 8 lbs since starting Tresiba and would like to lose around 15 lbs  Current glucose readings: 106 fasting this morning Fasting BG in the last 20 days: highest 127, lowest 69 Bedtime BG in the last 20 days: highest 220, lowest 133 Using OneTouch Verio meter; testing 2 times daily  *Pt notes she is not interested in using a CGM  Patient denies hypoglycemic s/sx including dizziness, shakiness, sweating.     Objective:  Lab Results  Component Value Date   HGBA1C 7.5 (H) 05/25/2023    Lab Results  Component Value Date   CREATININE 1.47 (H) 05/25/2023   BUN 42 (H) 05/25/2023   NA 140 05/25/2023   K 4.7 05/25/2023   CL 108 05/25/2023   CO2 22 05/25/2023    Lab Results  Component Value Date   CHOL 143 01/17/2023   HDL 38.80 (L) 01/17/2023   LDLCALC 59 01/17/2023   TRIG 222.0 (H) 01/17/2023   CHOLHDL 4 01/17/2023     Medications Reviewed Today     Reviewed by Dion Frankel, RPH (Pharmacist) on 06/19/23 at 1134  Med List Status: <None>   Medication Order Taking? Sig Documenting Provider Last Dose Status Informant  acetaminophen  (TYLENOL ) 500 MG tablet [id number]  Take 1,000 mg by mouth every 6 (six) hours as needed (for pain). [provider]  Active Self  Biotin  2500 MCG CAPS [id number]  Take 2,500 mcg by mouth daily.  [provider]  Active Self  Cholecalciferol  (VITAMIN D -3) 25 MCG (1000 UT) CAPS [id number]  Take 1,000 Units by mouth every other day. [provider]  Active Self  clindamycin  (CLEOCIN ) 2 % vaginal cream [id number]  Place 1 Applicatorful vaginally daily as needed (irritation).  [provider]  Active Self  Desoximetasone  0.05 % GEL [id number]  Apply 1 application topically daily as needed (ostomy pouch irritation). Nitka, James E, MD  Active Self           Med Note Guido Leeks, Lavonia Powers   Tue May 28, 2018  9:26 PM) TOPICORT   ezetimibe  (ZETIA ) 10 MG tablet [id number] Yes Take 1 tablet (10 mg total) by mouth daily. Zorita Hiss, NP  Active   famotidine  (PEPCID ) 20 MG tablet [id number]  Take 1 tablet (20 mg total) by mouth 2 (two) times daily. Asencion Blacksmith, MD  Active Self  furosemide  (LASIX ) 40 MG tablet [id number]  Take 40 mg by mouth daily as needed for fluid or edema. [provider]  Active Self  glucose blood (ONETOUCH VERIO) test strip [id number]  Use 2 times daily. [provider]  Active   hydrochlorothiazide  (HYDRODIURIL ) 25 MG tablet [id number]  Take 25 mg by mouth every morning.  [provider]  Active Self  insulin  degludec (TRESIBA) 100 UNIT/ML FlexTouch Pen [id number] Yes Inject 18 Units into the skin at bedtime. [provider]  Active   Insulin  Pen Needle 31G X 5 MM MISC [id number]  1 each by Does not apply route daily. Zorita Hiss, NP  Active   losartan  (COZAAR ) 100 MG tablet [id number]  Take 100 mg by  mouth at bedtime. [provider]  Active Self  Multiple Vitamins-Minerals (CENTRUM SILVER 50+WOMEN) TABS [id number]  Take 1 tablet by mouth daily with breakfast.  [provider]  Active Self  omeprazole  (PRILOSEC) 40 MG capsule [id number]  Take 40 mg by mouth at bedtime. [provider]  Active Self  Alisia Irons test strip [id number]  2 (two) times daily. [provider]  Active   Polyethyl Glycol-Propyl Glycol (SYSTANE) 0.4-0.3 % GEL ophthalmic gel [id number]  Place 1 application into both eyes at bedtime. [provider]  Active Self  potassium bicarbonate (K-LYTE) 25 MEQ disintegrating tablet [id number]  Take 25 mEq by mouth daily as needed (ONLY WHEN TAKING FUROSEMIDE ).  [provider]  Active Self  tirzepatide (MOUNJARO) 2.5 MG/0.5ML Pen [id number] Yes Inject 2.5 mg into the skin once a week. Arcadio Knuckles, MD  Active               Assessment/Plan:   Diabetes: - Currently controlled, A1c <8% - Reviewed long term cardiovascular and renal outcomes of uncontrolled blood sugar - Mounjaro is $47 per 30 DS with her insurance, no drug deductible on her plan - Recommend to start Mounjaro 2.5 mg weekly, decrease Tresiba to 18 units daily to avoid hypoglycemia - Counseled on side effects to monitor for when starting Mounjaro. Also monitor for hypoglycemia. Will likely be able to stop Tresiba if she tolerates Mounjaro - Recommend to check glucose 2-3x per day   Follow Up Plan: 7/2  Rainelle Bur, PharmD, BCPS, CPP Clinical Pharmacist Practitioner [place] [facility] at [facility] [phone]

## 2023-06-20 ENCOUNTER — Ambulatory Visit: Admitting: "Endocrinology

## 2023-06-20 ENCOUNTER — Other Ambulatory Visit (HOSPITAL_COMMUNITY): Payer: Self-pay

## 2023-06-20 NOTE — Telephone Encounter (Signed)
 Pharmacy Patient Advocate Encounter  Received notification from [facility] ADVANTAGE/RX ADVANCE that Prior Authorization for Mounjaro 2.5MG /0.5ML auto-injectors  has been APPROVED from 06/19/2023 to 06/18/2024. Unable to obtain price due to refill too soon rejection, last fill date 06/19/2023 next available fill date 07/10/2023   PA #/Case ID/Reference #: [id number]

## 2023-06-21 ENCOUNTER — Other Ambulatory Visit (HOSPITAL_COMMUNITY): Payer: Self-pay

## 2023-06-28 DIAGNOSIS — N1832 Chronic kidney disease, stage 3b: Secondary | ICD-10-CM | POA: Diagnosis not present

## 2023-07-02 ENCOUNTER — Ambulatory Visit: Admitting: Physician Assistant

## 2023-07-02 ENCOUNTER — Encounter: Payer: Self-pay | Admitting: Physician Assistant

## 2023-07-02 DIAGNOSIS — N1832 Chronic kidney disease, stage 3b: Secondary | ICD-10-CM | POA: Diagnosis not present

## 2023-07-02 DIAGNOSIS — M810 Age-related osteoporosis without current pathological fracture: Secondary | ICD-10-CM | POA: Insufficient documentation

## 2023-07-02 DIAGNOSIS — I129 Hypertensive chronic kidney disease with stage 1 through stage 4 chronic kidney disease, or unspecified chronic kidney disease: Secondary | ICD-10-CM | POA: Diagnosis not present

## 2023-07-02 DIAGNOSIS — E1122 Type 2 diabetes mellitus with diabetic chronic kidney disease: Secondary | ICD-10-CM | POA: Diagnosis not present

## 2023-07-02 NOTE — Addendum Note (Signed)
 Addended by: RODGERS LACY on: 07/02/2023 11:15 AM   Modules accepted: Orders

## 2023-07-02 NOTE — Addendum Note (Signed)
 Addended by: RODGERS LACY on: 07/02/2023 11:14 AM   Modules accepted: Orders

## 2023-07-02 NOTE — Progress Notes (Signed)
 Office Visit Note   Patient: Angelica French           Date of Birth: [birth year]           MRN: [id number] Visit Date: 07/02/2023              Requested by: Elnor Angelica BRAVO, NP [address] [place],  [place] [address] PCP: Elnor Angelica BRAVO, NP   Assessment & Plan: Visit Diagnoses:  1. Age-related osteoporosis without current pathological fracture     Plan: Patient is a pleasant 83 year old woman who is referred for evaluation of osteoporosis by Angelica Elnor.  She has not taken any medication for osteoporosis in the past.  She is denies any history of a fracture of her hip spine or wrist.  She has not had any history of heart attack or stroke.  No history of cancer.  She does have a single kidney and does have kidney disease.  She also has a history of an ulcer.  She does not have a history of severe reflux or epilepsy.  She did have a hysterectomy when she was 83 years old and was never placed on hormone replacement therapy.  She does take One-A-Day calcium  and 5000 international units of vitamin D  daily both her calcium  and vitamin D  are adequate.  Because of her kidney disease she does get blood drawn every 3 months by her nephrologist she does not smoke she has 1 alcoholic beverage per week.  As far as exercise she is walking 3 times weekly.  I spent 45 minutes reviewing her chart her labs and her bone density scan.  She is classified as low bone density.  Given her age and her early menopause she really can be classified as osteoporotic.  She cannot be a candidate for Fosamax or Reclast because of her kidneys.  I do think she might do well with Prolia.  I gave her information about this and told her about the concerns she would have to have her calcium  checked.  She would like to think about this and discuss it with her nephrologist.  I also reviewed her labs she does have an elevated PTH which is been elevated for couple years.  TSH is normal.  She does have an appointment for an endocrinologist  in October I do think she needs to have this better evaluated.  May follow-up with me if she decides to try Prolia  Follow-Up Instructions: Return if symptoms worsen or fail to improve.   Orders:  No orders of the defined types were placed in this encounter.  No orders of the defined types were placed in this encounter.     Procedures: No procedures performed   Clinical Data: No additional findings.   Subjective: No chief complaint on file.   HPI is an 83 year old woman comes in today for evaluation referred from Angelica Elnor for osteoporosis  Review of Systems  All other systems reviewed and are negative.    Objective: Vital Signs: There were no vitals taken for this visit.  Physical Exam Constitutional:      Appearance: Normal appearance.  Pulmonary:     Effort: Pulmonary effort is normal.     Breath sounds: Normal breath sounds.   Skin:    General: Skin is warm and dry.   Neurological:     General: No focal deficit present.     Mental Status: She is alert and oriented to person, place, and time.   Psychiatric:  Mood and Affect: Mood normal.        Behavior: Behavior normal.       Specialty Comments:  No specialty comments available.  Imaging: No results found.   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   Osteopenia 05/25/2023   Hoarseness 05/25/2023   Osteoporosis screening 04/25/2023   Fall 02/23/2023   Laryngitis 02/09/2023   SBO (small bowel obstruction) (HCC) 05/28/2018   Parastomal hernia without obstruction or gangrene 02/01/2018   Erosive gastritis 02/01/2018   Duodenitis 02/01/2018   Postoperative urinary retention 06/28/2017    Class: Acute   Urinary tract infection 06/28/2017    Class: Present on Admission   Anemia due to blood loss 06/28/2017    Class: Acute   S/P TKR (total knee  replacement) using cement, right 06/25/2017   Diabetes mellitus type 2 without retinopathy (HCC) 06/04/2017   Bilateral primary osteoarthritis of knee 04/13/2017    Class: Chronic   Status post left knee replacement 04/13/2017   Dry eye syndrome of both lacrimal glands 05/25/2016   Myopia with astigmatism and presbyopia, bilateral 05/25/2016   Posterior vitreous detachment of both eyes 05/25/2016   Refractive amblyopia of left eye 05/25/2016   Depression 03/01/2016   Hypokalemia 08/31/2015   CKD (chronic kidney disease) stage 3, GFR 30-59 ml/min (HCC) 07/08/2015   Type 2 diabetes mellitus with diabetic chronic kidney disease (HCC) 06/29/2015   Atrophy of right kidney 04/26/2015   Benign hypertension with CKD (chronic kidney disease) stage III (HCC) 03/22/2015   Mixed hyperlipidemia 03/22/2015   Renal artery stenosis of unknown cause (HCC) 02/17/2015   Right renal artery stenosis (HCC) 02/17/2015   Hyperlipidemia 09/16/2014   Past Medical History:  Diagnosis Date   Arthritis    Bleeding ulcer    CHF (congestive heart failure) (HCC)    Chronic kidney disease    Renal artery stenosis - right. Left kindey ok- sees Dr Carlette- nepjrologist   Diabetes mellitus without complication (HCC)    type II   Fundic gland polyps of stomach, benign    Gastric ulcer 1994   bleeding   History of blood transfusion    1970- illeostomy 2004-   Hypertension    UC (ulcerative colitis) (HCC)    VRE (vancomycin resistant enterococcus) culture positive 2004    Family History  Problem Relation Age of Onset   Heart disease Father     Past Surgical History:  Procedure Laterality Date   ABDOMINAL HYSTERECTOMY     ABSCESS DRAINAGE     abdominal   BACK SURGERY Left 1997   Left 5 DISECTOMY   BLADDER SUSPENSION  1998   EYE SURGERY Bilateral    cataract   HERNIA MESH REMOVAL  2004   relocation of ileostomy to opposite side of abdomen   PERMANENT ILEOSTOMY  1970   stoma hernia  2004   attempted-  resulting in perforated bowel and infection   stoma; Hernia repair     2003- attempted   TOTAL KNEE ARTHROPLASTY Left 04/13/2017   Procedure: LEFT TOTAL KNEE ARTHROPLASTY AND RIGHT KNEE INTRA-ARTICULAR STEROID INJECTION;  Surgeon: Lucilla Lynwood BRAVO, MD;  Location: [facility] OR;  Service: Orthopedics;  Laterality: Left;   TOTAL KNEE ARTHROPLASTY Right 06/25/2017   TOTAL KNEE ARTHROPLASTY Right 06/25/2017   Procedure: RIGHT TOTAL KNEE ARTHROPLASTY;  Surgeon: Lucilla Lynwood BRAVO, MD;  Location: [facility] OR;  Service: Orthopedics;  Laterality:  Right;   Social History   Occupational History   Not on file  Tobacco Use   Smoking status: Never   Smokeless tobacco: Never  Vaping Use   Vaping status: Never Used  Substance and Sexual Activity   Alcohol use: Yes    Comment: occasional drink with liquor   Drug use: Never   Sexual activity: Not on file

## 2023-07-03 ENCOUNTER — Encounter (HOSPITAL_COMMUNITY): Payer: Self-pay | Admitting: Physician Assistant

## 2023-07-04 ENCOUNTER — Other Ambulatory Visit: Admitting: Pharmacist

## 2023-07-04 DIAGNOSIS — N1832 Chronic kidney disease, stage 3b: Secondary | ICD-10-CM

## 2023-07-04 DIAGNOSIS — E1122 Type 2 diabetes mellitus with diabetic chronic kidney disease: Secondary | ICD-10-CM

## 2023-07-04 MED ORDER — TIRZEPATIDE 5 MG/0.5ML ~~LOC~~ SOAJ: 5.0000 mg | SUBCUTANEOUS | 2 refills | Status: DC

## 2023-07-04 NOTE — Patient Instructions (Signed)
 It was a pleasure speaking with you today!  - Recommend to increase Mounjaro  to 5 mg after completing 4 weeks of  Mounjaro  2.5 mg weekly. Stop Tresiba when increasing to Mounjaro  5 mg  - Monitor for GI upset such as nausea, vomiting, diarrhea, or constipation. These may improve after 2 weeks on the increased dose.   - Monitor for low blood sugars below 80.  How to treat low blood sugar:  - For blood sugar less than 70: Treat with 4 ounces of juice or regular soda, or with 3 to 4 glucose tablets.  - Re-check blood sugar in 15 minutes. ?  -If blood sugar is still less than 70 on re-check, treat again and re-check in 15 minutes - Once blood sugar is back above 70, eat a balanced meal or snack to avoid blood sugar dropping again   Feel free to call with any questions or concerns!  Darrelyn Drum, PharmD, BCPS, CPP Clinical Pharmacist Practitioner [place] [facility] at [facility] [phone]

## 2023-07-04 NOTE — Progress Notes (Cosign Needed Addendum)
 07/04/2023 Name: Angelica French MRN: [id number] DOB: 1941/01/02  Chief Complaint  Patient presents with   Diabetes   Medication Management    Angelica French is a 83 y.o. year old female who presented for a telephone visit.   They were referred to the pharmacist by their PCP for assistance in managing diabetes.   Subjective:  Care Team: Primary Care Provider: Elnor Lauraine BRAVO, NP ; Next Scheduled Visit: 09/27/23   Medication Access/Adherence  Current [facility]acy:  [id number] [place], [place] - [address] [place] [address] Phone: [phone] Fax: [phone]   Patient reports affordability concerns with their medications: Yes  Patient reports access/transportation concerns to their pharmacy: No  Patient reports adherence concerns with their medications:  No     Diabetes:  Current medications: Tresiba 10 units daily, Mounjaro  2.5 mg weekly Medications tried in the past: Victoza  (had diarrhea/GI upset), metformin (diarrhea)  No nausea or vomiting since starting Mounjaro . Has noted decreased appetite, has 3 lbs.   *She notes she has gained 8 lbs since starting Tresiba and would like to lose around 15 lbs  Current glucose readings: 106 fasting this morning Fasting BG in the last week: 116,102,15,89,102,105,94,118 Bedtime BG in the last week: [phone] Using OneTouch Verio meter; testing 2 times daily *She notes prior to Mounjaro  her bedtime BG were consistently 150-180s  *Pt notes she is not interested in using a CGM  Patient denies hypoglycemic s/sx including dizziness, shakiness, sweating.     Objective:  Lab Results  Component Value Date   HGBA1C 7.5 (H) 05/25/2023    Lab Results  Component Value Date   CREATININE 1.47 (H) 05/25/2023   BUN 42 (H) 05/25/2023   NA 140 05/25/2023   K 4.7 05/25/2023   CL 108 05/25/2023   CO2 22 05/25/2023    Lab Results  Component Value Date   CHOL 143 01/17/2023   HDL  38.80 (L) 01/17/2023   LDLCALC 59 01/17/2023   TRIG 222.0 (H) 01/17/2023   CHOLHDL 4 01/17/2023    Medications Reviewed Today     Reviewed by Merceda Lela SAUNDERS, RPH (Pharmacist) on 07/04/23 at 1116  Med List Status: <None>   Medication Order Taking? Sig Documenting Provider Last Dose Status Informant  acetaminophen  (TYLENOL ) 500 MG tablet [id number]  Take 1,000 mg by mouth every 6 (six) hours as needed (for pain). [provider]  Active Self  Biotin  2500 MCG CAPS [id number]  Take 2,500 mcg by mouth daily.  [provider]  Active Self  Cholecalciferol  (VITAMIN D -3) 25 MCG (1000 UT) CAPS [id number]  Take 1,000 Units by mouth every other day. [provider]  Active Self  clindamycin  (CLEOCIN ) 2 % vaginal cream [id number]  Place 1 Applicatorful vaginally daily as needed (irritation).  [provider]  Active Self  Desoximetasone  0.05 % GEL [id number]  Apply 1 application topically daily as needed (ostomy pouch irritation). Nitka, James E, MD  Active Self           Med Note MARISA, NATHANEL SAILOR   Tue May 28, 2018  9:26 PM) TOPICORT   ezetimibe  (ZETIA ) 10 MG tablet [id number]  Take 1 tablet (10 mg total) by mouth daily. Elnor Lauraine BRAVO, NP  Active   famotidine  (PEPCID ) 20 MG tablet [id number]  Take 1 tablet (20 mg total) by mouth 2 (two) times daily. Aneita Gwendlyn DASEN, MD  Active Self  furosemide  (LASIX ) 40 MG tablet [id number]  Take 40 mg by mouth daily  as needed for fluid or edema. [provider]  Active Self  glucose blood (ONETOUCH VERIO) test strip [id number]  Use 2 times daily. [provider]  Active   hydrochlorothiazide  (HYDRODIURIL ) 25 MG tablet [id number]  Take 25 mg by mouth every morning.  [provider]  Active Self  insulin  degludec (TRESIBA) 100 UNIT/ML FlexTouch Pen [id number] Yes Inject 10 Units into the skin at bedtime. [provider]  Active   Insulin  Pen Needle 31G X 5 MM MISC [id number]  1 each by Does not apply route  daily. Elnor Lauraine BRAVO, NP  Active   losartan  (COZAAR ) 100 MG tablet [id number]  Take 100 mg by mouth at bedtime. [provider]  Active Self  Multiple Vitamins-Minerals (CENTRUM SILVER 50+WOMEN) TABS [id number]  Take 1 tablet by mouth daily with breakfast.  [provider]  Active Self  omeprazole  (PRILOSEC) 40 MG capsule [id number]  Take 40 mg by mouth at bedtime. [provider]  Active Self  AISHA SINKS test strip [id number]  2 (two) times daily. [provider]  Active   Polyethyl Glycol-Propyl Glycol (SYSTANE) 0.4-0.3 % GEL ophthalmic gel [id number]  Place 1 application into both eyes at bedtime. [provider]  Active Self  potassium bicarbonate (K-LYTE) 25 MEQ disintegrating tablet [id number]  Take 25 mEq by mouth daily as needed (ONLY WHEN TAKING FUROSEMIDE ).  [provider]  Active Self  tirzepatide  (MOUNJARO ) 2.5 MG/0.5ML Pen [id number] Yes Inject 2.5 mg into the skin once a week. Joshua Debby CROME, MD  Active               Assessment/Plan:   Diabetes: - Currently controlled, A1c <8% - Reviewed long term cardiovascular and renal outcomes of uncontrolled blood sugar - Mounjaro  is $47 per 30 DS with her insurance, no drug deductible on her plan - Recommend to increase Mounjaro  to 5 mg after completing 4 weeks of  Mounjaro  2.5 mg weekly. Stop [place] when increasing to Mounjaro  5 mg - Counseled on side effects to monitor for when increaseing Mounjaro . Also monitor for hypoglycemia. - Recommend to check glucose 2-3x per day   Follow Up Plan: 7/28  Darrelyn Drum, PharmD, BCPS, CPP Clinical Pharmacist Practitioner [place] [facility] at [facility] [phone]  Medical screening examination/treatment/procedure(s) were performed by non-physician practitioner and as supervising physician I was immediately available for consultation/collaboration.  I agree with above. Karlynn Noel, MD

## 2023-07-30 ENCOUNTER — Other Ambulatory Visit (INDEPENDENT_AMBULATORY_CARE_PROVIDER_SITE_OTHER): Admitting: Pharmacist

## 2023-07-30 DIAGNOSIS — N1832 Chronic kidney disease, stage 3b: Secondary | ICD-10-CM

## 2023-07-30 DIAGNOSIS — E1122 Type 2 diabetes mellitus with diabetic chronic kidney disease: Secondary | ICD-10-CM

## 2023-07-30 MED ORDER — TIRZEPATIDE 5 MG/0.5ML ~~LOC~~ SOAJ: 5.0000 mg | SUBCUTANEOUS | 0 refills | Status: DC

## 2023-07-30 NOTE — Patient Instructions (Signed)
 It was a pleasure speaking with you today!  Continue Mounjaro  5 mg weekly. [facility]  Feel free to call with any questions or concerns!  Darrelyn Drum, PharmD, BCPS, CPP Clinical Pharmacist Practitioner [place] [facility] at [facility] [phone]

## 2023-07-30 NOTE — Progress Notes (Signed)
 07/30/2023 Name: Angelica French MRN: [id number] DOB: 10-03-1940  Chief Complaint  Patient presents with   Diabetes   Medication Management    Angelica French is a 83 y.o. year old female who presented for a telephone visit.   They were referred to the pharmacist by their PCP for assistance in managing diabetes.   Subjective:  Care Team: Primary Care Provider: Elnor Lauraine BRAVO, NP ; Next Scheduled Visit: 09/27/23   Medication Access/Adherence  Current Pharmacy:  [id number] - [place], [place] - [address] [place] [address] Phone: [phone] Fax: [phone]   Patient reports affordability concerns with their medications: Yes  Patient reports access/transportation concerns to their pharmacy: No  Patient reports adherence concerns with their medications:  No     Diabetes:  Current medications: Mounjaro  5 mg weekly (started 07/19/23) Medications tried in the past: Victoza  (had diarrhea/GI upset), metformin (diarrhea), Tresiba (d/c when started Mounjaro  5 mg)  Has felt fatigued. Noted her clothes are more loose.  Has been watching starches  No nausea or vomiting since starting Mounjaro . Has noted decreased appetite  Highest fasting 127, majority <115 Highest bedtime 155, majority <140 On 2.5 mg Mounjaro   Current glucose readings:  7/18 AM 102, bedtime 110 7/19 AM 105, HS 183 7/20 118, 114 7/21 105, 118 7/22 114, 167 7/23 113, 127 7/24 107, 143 7/25 158, 138 7/26 100, 138 7/27 123, 115 7/28 128 this morning  Using OneTouch Verio meter; testing 2 times daily *She notes prior to Mounjaro  her bedtime BG were consistently 150-180s  *Pt notes she is not interested in using a CGM  Patient denies hypoglycemic s/sx including dizziness, shakiness, sweating.     Objective:  Lab Results  Component Value Date   HGBA1C 7.5 (H) 05/25/2023    Lab Results  Component Value Date   CREATININE 1.47 (H) 05/25/2023   BUN 42 (H) 05/25/2023    NA 140 05/25/2023   K 4.7 05/25/2023   CL 108 05/25/2023   CO2 22 05/25/2023    Lab Results  Component Value Date   CHOL 143 01/17/2023   HDL 38.80 (L) 01/17/2023   LDLCALC 59 01/17/2023   TRIG 222.0 (H) 01/17/2023   CHOLHDL 4 01/17/2023    Medications Reviewed Today     Reviewed by Merceda Lela SAUNDERS, RPH (Pharmacist) on 07/30/23 at 1131  Med List Status: <None>   Medication Order Taking? Sig Documenting Provider Last Dose Status Informant  acetaminophen  (TYLENOL ) 500 MG tablet [id number]  Take 1,000 mg by mouth every 6 (six) hours as needed (for pain). [provider]  Active Self  Biotin  2500 MCG CAPS [id number]  Take 2,500 mcg by mouth daily.  [provider]  Active Self  Cholecalciferol  (VITAMIN D -3) 25 MCG (1000 UT) CAPS [id number]  Take 1,000 Units by mouth every other day. [provider]  Active Self  clindamycin  (CLEOCIN ) 2 % vaginal cream [id number]  Place 1 Applicatorful vaginally daily as needed (irritation).  [provider]  Active Self  Desoximetasone  0.05 % GEL [id number]  Apply 1 application topically daily as needed (ostomy pouch irritation). Lucilla Lynwood BRAVO, MD  Active Self           Med Note MARISA, NATHANEL SAILOR   Tue May 28, 2018  9:26 PM) TOPICORT   ezetimibe  (ZETIA ) 10 MG tablet [id number]  Take 1 tablet (10 mg total) by mouth daily. Elnor Lauraine BRAVO, NP  Active   famotidine  (PEPCID ) 20 MG tablet [id number]  Take 1 tablet (20 mg total) by mouth 2 (two) times daily. Aneita Gwendlyn DASEN, MD  Active Self  furosemide  (LASIX ) 40 MG tablet [id number]  Take 40 mg by mouth daily as needed for fluid or edema. [provider]  Active Self  glucose blood (ONETOUCH VERIO) test strip [id number]  Use 2 times daily. [provider]  Active   hydrochlorothiazide  (HYDRODIURIL ) 25 MG tablet [id number]  Take 25 mg by mouth every morning.  [provider]  Active Self   Patient not taking:   Discontinued 07/30/23 1131 (Change in therapy)      Discontinued 07/30/23 1131   losartan  (COZAAR ) 100 MG tablet [id number]  Take 100 mg by mouth at bedtime. [provider]  Active Self  Multiple Vitamins-Minerals (CENTRUM SILVER 50+WOMEN) TABS [id number]  Take 1 tablet by mouth daily with breakfast.  [provider]  Active Self  omeprazole  (PRILOSEC) 40 MG capsule [id number]  Take 40 mg by mouth at bedtime. [provider]  Active Self  AISHA SINKS test strip [id number]  2 (two) times daily. [provider]  Active   Polyethyl Glycol-Propyl Glycol (SYSTANE) 0.4-0.3 % GEL ophthalmic gel [id number]  Place 1 application into both eyes at bedtime. [provider]  Active Self  potassium bicarbonate (K-LYTE) 25 MEQ disintegrating tablet [id number]  Take 25 mEq by mouth daily as needed (ONLY WHEN TAKING FUROSEMIDE ).  [provider]  Active Self  tirzepatide  (MOUNJARO ) 5 MG/0.5ML Pen [id number]  Inject 5 mg into the skin once a week. Rollene Almarie LABOR, MD  Active               Assessment/Plan:   Diabetes: - Currently controlled, A1c <8%. BG have done very well on mounjaro  5 mg alone - Reviewed long term cardiovascular and renal outcomes of uncontrolled blood sugar - Mounjaro  is $47 per 30 DS with her insurance, no drug deductible on her plan - Recommend to Continue Mounjaro  5 mg weekly - Recommend to check glucose 2-3x per day   Follow Up Plan: 8/25  Darrelyn Drum, PharmD, BCPS, CPP Clinical Pharmacist Practitioner [facility] [phone]

## 2023-08-02 ENCOUNTER — Encounter (INDEPENDENT_AMBULATORY_CARE_PROVIDER_SITE_OTHER): Payer: Self-pay | Admitting: Otolaryngology

## 2023-08-02 ENCOUNTER — Ambulatory Visit (INDEPENDENT_AMBULATORY_CARE_PROVIDER_SITE_OTHER): Admitting: Otolaryngology

## 2023-08-02 VITALS — BP 152/64 | HR 69

## 2023-08-02 DIAGNOSIS — R09A2 Foreign body sensation, throat: Secondary | ICD-10-CM | POA: Diagnosis not present

## 2023-08-02 DIAGNOSIS — K117 Disturbances of salivary secretion: Secondary | ICD-10-CM

## 2023-08-02 DIAGNOSIS — R490 Dysphonia: Secondary | ICD-10-CM | POA: Diagnosis not present

## 2023-08-02 DIAGNOSIS — J383 Other diseases of vocal cords: Secondary | ICD-10-CM | POA: Diagnosis not present

## 2023-08-02 NOTE — Progress Notes (Signed)
 ENT CONSULT:  Reason for Consult: hoarseness and globus sensation  HPI: Discussed the use of AI scribe software for clinical note transcription with the patient, who gave verbal consent to proceed.  History of Present Illness Angelica French is an 83 year old female who presents with intermittent hoarseness and dry mouth as well as persistent globus sensation   She has experienced intermittent hoarseness for approximately six months. Initially, her voice would become so hoarse that it was barely audible, but this has not occurred recently. The hoarseness resolves and then recurs, with episodes lasting about a week before clearing up.  In the past three to four weeks, she has experienced an extremely dry mouth, particularly noticeable upon waking when her tongue feels stuck to the roof of her mouth and her throat feels dry. She describes it as a sensation of not having enough saliva to keep her mouth lubricated.  No difficulty with swallowing or pain when swallowing. She also denies a history of heartburn or reflux symptoms. She mentions having an ileostomy but does not report any related issues.  Records Reviewed:  Lauraine Pereyra NP office visit 05/25/22 T2DM/HLD: Type 2 diabetes/hyperlipidemia: Last A1c 8.4.  She has comorbid chronic kidney disease as well.  Diabetes treated with Tresiba 18 units at bedtime.  Was experiencing significant myalgias from atorvastatin  so she discontinued this.  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  Was referred to endocrinology but is having a hard time getting into their office so would like referral to [facility] end[facility]inology.  Currently on Tresiba 18 units daily, losartan  100 mg daily.   Osteopenia: Identified on last bone scan.  Does have high risk of fracture.  Would like to go to osteoporosis clinic for treatment recommendations.   Hoarseness, neck discomfort: She continues to have intermittent hoarseness as well as discomfort on  the right side of her neck.  Almost feels like a swelling sensation.  She would like to see ENT.     Past Medical History:  Diagnosis Date   Arthritis    Bleeding ulcer    CHF (congestive heart failure) (HCC)    Chronic kidney disease    Renal artery stenosis - right. Left kindey ok- sees Dr Carlette- nepjrologist   Diabetes mellitus without complication (HCC)    type II   Fundic gland polyps of stomach, benign    Gastric ulcer 1994   bleeding   History of blood transfusion    1970- illeostomy 2004-   Hypertension    UC (ulcerative colitis) (HCC)    VRE (vancomycin resistant enterococcus) culture positive 2004    Past Surgical History:  Procedure Laterality Date   ABDOMINAL HYSTERECTOMY     ABSCESS DRAINAGE     abdominal   BACK SURGERY Left 1997   Left 5 DISECTOMY   BLADDER SUSPENSION  1998   EYE SURGERY Bilateral    cataract   HERNIA MESH REMOVAL  2004   relocation of ileostomy to opposite side of abdomen   PERMANENT ILEOSTOMY  1970   stoma hernia  2004   attempted- resulting in perforated bowel and infection   stoma; Hernia repair     2003- attempted   TOTAL KNEE ARTHROPLASTY 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 She has never used smokeless tobacco. She reports current alcohol use. She reports that she does not use drugs.  Allergies:  Allergies  Allergen Reactions   Nsaids Nausea Only and Other (See Comments)    GI pain after prolonged use = ULCERS     Tape  Hives and Itching   Aspirin  Other (See Comments)    No buffered aspirin  = GI pain that develops into ulcers   Atorvastatin      Myopathy    Coreg  [Carvedilol ] Other (See Comments)    Makes my back hurt   Pravastatin      Myopathy     Medications: I have reviewed the patient's current medications.  The PMH, PSH, Medications, Allergies, and SH were reviewed and updated.  ROS: Constitutional: Negative for fever, weight loss and weight gain. Cardiovascular: Negative for chest pain and dyspnea on exertion. Respiratory: Is not experiencing shortness of breath at rest. Gastrointestinal: Negative for nausea and vomiting. Neurological: Negative for headaches. Psychiatric: The patient is not nervous/anxious  Blood pressure (!) 152/64, pulse 69, SpO2 95%. There is no height or weight on file to calculate BMI.  PHYSICAL EXAM:  Exam: General: Well-developed, well-nourished Communication and Voice: raspy Respiratory Respiratory effort: Equal inspiration and expiration without stridor Cardiovascular Peripheral Vascular: Warm extremities with equal color/perfusion Eyes: No nystagmus with equal extraocular motion bilaterally Neuro/Psych/Balance: Patient oriented to person, place, and time; Appropriate mood and affect; Gait is intact with no imbalance; Cranial nerves I-XII are intact Head and Face Inspection: Normocephalic and atraumatic without mass or lesion Palpation: Facial skeleton intact without bony stepoffs Salivary Glands: No mass or tenderness Facial Strength: Facial motility symmetric and full bilaterally ENT Pinna: External ear intact and fully developed External canal: Canal is patent with intact skin Tympanic Membrane: Clear and mobile External Nose: No scar or anatomic deformity Internal Nose: Septum is relatively straight. No polyp, or purulence. Mucosal edema and erythema present.  Bilateral inferior turbinate hypertrophy.  Lips, Teeth, and gums: Mucosa and teeth  intact and viable TMJ: No pain to palpation with full mobility Oral cavity/oropharynx: No erythema or exudate, no lesions present Nasopharynx: No mass or lesion with intact mucosa Hypopharynx: Intact mucosa without pooling of secretions Larynx Glottic: Full true vocal cord mobility without lesion or mass Supraglottic: Normal appearing epiglottis and AE folds Interarytenoid Space: Moderate pachydermia&edema Subglottic Space: Patent without lesion or edema Neck Neck and Trachea: Midline trachea without mass or lesion Thyroid : No mass or nodularity Lymphatics: No lymphadenopathy  Procedure:  Preoperative diagnosis: hoarseness and globus  Postoperative diagnosis:   same + VF atrophy and GERD LPR  Procedure: Flexible fiberoptic laryngoscopy with stroboscopy (68420)   Surgeon: Elena Larry, MD  Anesthesia: Topical lidocaine  and Afrin  Complications: None  Condition is stable throughout exam  Indications and consent:   The patient presents to the clinic with hoarseness. All the risks, benefits, and potential complications were reviewed with the patient preoperatively and informed verbal consent was obtained.  Procedure: The patient was seated upright in the exam chair.   Topical lidocaine  and Afrin were applied to the nasal cavity. After adequate anesthesia had occurred, the flexible telescope with strobe capabilities was passed into the nasal cavity. The nasopharynx was patent  without mass or lesion. The scope was passed behind the soft palate and directed toward the base of tongue. The base of tongue was visualized and was symmetric with no apparent masses or abnormal appearing tissue. There were no signs of a mass or pooling of secretions in the piriform sinuses. The supraglottic structures were normal.  The true vocal cords are mobile. The medial edges were bowed. Closure was incomplete with supraglottic compression. Periodicity present. The mucosal wave and amplitude were intact  and symmetric. There is moderate interarytenoid pachydermia and post cricoid edema. The mucosa appears without lesions.   The laryngoscope was then slowly withdrawn and the patient tolerated the procedure well. There were no complications or blood loss.  Studies Reviewed: 06/25/2017 The lungs are clear. There is no pleural effusion or pneumothorax. The cardiac silhouette is within normal limits. No acute osseous pathology. Atherosclerotic calcification of the aorta.   IMPRESSION: No active cardiopulmonary disease.  Assessment/Plan: Encounter Diagnoses  Name Primary?   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 We discussed voice therapy but she would like to hold off for now - monitor for now  Globus sensation x 6 mo Laryngopharyngeal reflux with throat irritation and globus sensation, likely silent reflux due to absence of heartburn. No other findings on scope exam today -  Reflux Gourmet after meals - diet and lifestyle changes to minimize GERD - Refer to [employer] blog for dietary and lifestyle modifications/reflux cook book  Dry mouth (xerostomia) Recent onset of dry mouth, possibly medication-related. No visible dryness observed. - Recommend Xylemelt for dry mouth. - Advise switching to non-irritating toothpaste and mouthwash. - Discuss with primary care doctor about potential medication adjustments.   Thank you for allowing me to participate in the care of this patient. Please do not hesitate to contact me with any questions or concerns.   Elena Larry, MD Otolaryngology [facility] Phone: [phone] Fax: [phone]    08/02/2023, 10:31 AM

## 2023-08-02 NOTE — Patient Instructions (Addendum)
 Use Xylimelts for dry mouth  GamingLesson.nl - check out this website to learn more about reflux   -Avoid lying down for at least two hours after a meal or after drinking acidic beverages, like soda, or other caffeinated beverages. This can help to prevent stomach contents from flowing back into the esophagus. -Keep your head elevated while you sleep. Using an extra pillow or two can also help to prevent reflux. -Eat smaller and more frequent meals each day instead of a few large meals. This promotes digestion and can aid in preventing heartburn. -Wear loose-fitting clothes to ease pressure on the stomach, which can worsen heartburn and reflux. -Reduce excess weight around the midsection. This can ease pressure on the stomach. Such pressure can force some stomach contents back up the esophagus   - Take Reflux Gourmet (natural supplement available on [employer]) to help with symptoms of chronic throat irritation

## 2023-08-23 DIAGNOSIS — Z932 Ileostomy status: Secondary | ICD-10-CM | POA: Diagnosis not present

## 2023-08-27 ENCOUNTER — Other Ambulatory Visit: Admitting: Pharmacist

## 2023-08-27 DIAGNOSIS — N1832 Chronic kidney disease, stage 3b: Secondary | ICD-10-CM

## 2023-08-27 DIAGNOSIS — E1122 Type 2 diabetes mellitus with diabetic chronic kidney disease: Secondary | ICD-10-CM

## 2023-08-27 DIAGNOSIS — Z7985 Long-term (current) use of injectable non-insulin antidiabetic drugs: Secondary | ICD-10-CM

## 2023-08-27 NOTE — Progress Notes (Signed)
   08/27/2023 Name: Angelica French MRN: [id number] DOB: Sep 28, 1940  Chief Complaint  Patient presents with   Diabetes   Medication Management    Angelica French is a 83 y.o. year old female who presented for a telephone visit.   They were referred to the pharmacist by their PCP for assistance in managing diabetes.   Subjective:  Care Team: Primary Care Provider: Elnor Lauraine BRAVO, NP ; Next Scheduled Visit: 09/27/23   Medication Access/Adherence  Current Pharmacy:  [id number] - [place], [place] - [address] [place] [address] Phone: [phone] Fax: [phone]   Patient reports affordability concerns with their medications: No  Patient reports access/transportation concerns to their pharmacy: No  Patient reports adherence concerns with their medications:  No      Diabetes: Current medications: Mounjaro  5 mg weekly (started 07/19/23) Medications tried in the past: Victoza  (had diarrhea/GI upset), metformin (diarrhea), [place] (d/c when started Mounjaro  5 mg)  She started experiencing significant fatigue but then started drinking Boost for diabetics. Has felt better since supplementing with Boost.   Home BG readings: 8/17 AM 135, HS 124 8/18 AM 138, HS 148 8/19 112, 141 8/20 115, 127 8/21 118, 177 8/22 117, 138 8/23 114, 157 8/24 118, 134 8/25 116  Weight this morning 128.6 lb  Has been watching starches  No nausea or vomiting since starting Mounjaro . Has noted decreased appetite  Using OneTouch Verio meter; testing 2 times daily *She notes prior to Mounjaro  her bedtime BG were consistently 150-180s  *Pt notes she is not interested in using a CGM  Patient denies hypoglycemic s/sx including dizziness, shakiness, sweating.     Objective:  Lab Results  Component Value Date   HGBA1C 7.5 (H) 05/25/2023    Lab Results  Component Value Date   CREATININE 1.47 (H) 05/25/2023   BUN 42 (H) 05/25/2023   NA 140 05/25/2023   K 4.7  05/25/2023   CL 108 05/25/2023   CO2 22 05/25/2023    Lab Results  Component Value Date   CHOL 143 01/17/2023   HDL 38.80 (L) 01/17/2023   LDLCALC 59 01/17/2023   TRIG 222.0 (H) 01/17/2023   CHOLHDL 4 01/17/2023    Medications Reviewed Today   Medications were not reviewed in this encounter       Assessment/Plan:   Diabetes: - Currently controlled, A1c <8%. BG have done very well on mounjaro  5 mg alone - Reviewed long term cardiovascular and renal outcomes of uncontrolled blood sugar - Mounjaro  is $47 per 30 DS with her insurance, no drug deductible on her plan - Recommend to Continue Mounjaro  5 mg weekly - Recommend to check glucose 2-3x per day   Follow Up Plan: 9/8  Angelica French, PharmD, BCPS, CPP Clinical Pharmacist Practitioner [facility] [phone]

## 2023-08-27 NOTE — Patient Instructions (Signed)
 It was a pleasure speaking with you today!  Continue Mounjaro  5 mg weekly.  I will call back on 09/10/23 at 11 AM.  Feel free to call with any questions or concerns!  Darrelyn Drum, PharmD, BCPS, CPP Clinical Pharmacist Practitioner [place] [facility] [phone]

## 2023-09-10 ENCOUNTER — Other Ambulatory Visit: Admitting: Pharmacist

## 2023-09-10 DIAGNOSIS — N1832 Chronic kidney disease, stage 3b: Secondary | ICD-10-CM

## 2023-09-10 DIAGNOSIS — Z7985 Long-term (current) use of injectable non-insulin antidiabetic drugs: Secondary | ICD-10-CM

## 2023-09-10 DIAGNOSIS — E1122 Type 2 diabetes mellitus with diabetic chronic kidney disease: Secondary | ICD-10-CM

## 2023-09-10 NOTE — Patient Instructions (Signed)
 It was a pleasure speaking with you today!  Continue Mounjaro  5 mg weekly. Continue working on diet and increase fluid intake.  Feel free to call with any questions or concerns!  Darrelyn Drum, PharmD, BCPS, CPP Clinical Pharmacist Practitioner  Primary Care at [facility] [phone]

## 2023-09-10 NOTE — Progress Notes (Signed)
 09/10/2023 Name: Angelica French MRN: [id number] DOB: 1940/09/19  Chief Complaint  Patient presents with   Diabetes   Medication Management    Angelica French is a 83 y.o. year old female who presented for a telephone visit.   They were referred to the pharmacist by their PCP for assistance in managing diabetes.   Subjective:  Care Team: Primary Care Provider: Elnor Lauraine BRAVO, NP ; Next Scheduled Visit: 09/27/23   Medication Access/Adherence  Current Pharmacy:  [id number] - [place] - [address] [place] [address] Phone: [phone] Fax: [phone]   Patient reports affordability concerns with their medications: No  Patient reports access/transportation concerns to their pharmacy: No  Patient reports adherence concerns with their medications:  No      Diabetes: Current medications: Mounjaro  5 mg weekly (started 07/19/23) Medications tried in the past: Victoza  (had diarrhea/GI upset), metformin (diarrhea), Tresiba (d/c when started Mounjaro  5 mg)   She started experiencing significant fatigue but then started drinking Boost for diabetics. Has felt better since supplementing with Boost although still experiences fatigue, especially in the morning.   Home BG readings: 8/26 AM 118, HS 134 8/27 116, 177 8/28 125, 175 8/29 108, 158 8/30 138, 177 8/31 146, 178 9/1 128, 119 9/2 115, 168 9/3 133, 168 9/4 136, 162 9/5 137, 149 9/6 112, 135 9/7 131, 124 9/8 AM 125  Weight this morning 125.4 lbs, 2 weeks ago 128.6 lb  Has been watching starches  No nausea or vomiting since starting Mounjaro . Has noted decreased appetite  Using OneTouch Verio meter; testing 2 times daily *She notes prior to Mounjaro  her bedtime BG were consistently 150-180s  *Pt notes she is not interested in using a CGM  Patient denies hypoglycemic s/sx including dizziness, shakiness, sweating.     Objective:  Lab Results  Component Value Date   HGBA1C  7.5 (H) 05/25/2023    Lab Results  Component Value Date   CREATININE 1.47 (H) 05/25/2023   BUN 42 (H) 05/25/2023   NA 140 05/25/2023   K 4.7 05/25/2023   CL 108 05/25/2023   CO2 22 05/25/2023    Lab Results  Component Value Date   CHOL 143 01/17/2023   HDL 38.80 (L) 01/17/2023   LDLCALC 59 01/17/2023   TRIG 222.0 (H) 01/17/2023   CHOLHDL 4 01/17/2023    Medications Reviewed Today     Reviewed by Merceda Lela SAUNDERS, RPH (Pharmacist) on 09/10/23 at 1639  Med List Status: <None>   Medication Order Taking? Sig Documenting Provider Last Dose Status Informant  acetaminophen  (TYLENOL ) 500 MG tablet [id number]  Take 1,000 mg by mouth every 6 (six) hours as needed (for pain). [provider]  Active Self  Biotin  2500 MCG CAPS [id number]  Take 2,500 mcg by mouth daily.  [provider]  Active Self  Cholecalciferol  (VITAMIN D -3) 25 MCG (1000 UT) CAPS [id number]  Take 1,000 Units by mouth every other day. [provider]  Active Self  clindamycin  (CLEOCIN ) 2 % vaginal cream [id number]  Place 1 Applicatorful vaginally daily as needed (irritation).  [provider]  Active Self  Desoximetasone  0.05 % GEL [id number]  Apply 1 application topically daily as needed (ostomy pouch irritation). Nitka, James E, MD  Active Self           Med Note MARISA NATHANEL SAILOR   Tue May 28, 2018  9:26 PM) TOPICORT   ezetimibe  (ZETIA ) 10 MG tablet [id number]  Take 1  tablet (10 mg total) by mouth daily. Elnor Lauraine BRAVO, NP  Active   famotidine  (PEPCID ) 20 MG tablet [id number]  Take 1 tablet (20 mg total) by mouth 2 (two) times daily. Aneita Gwendlyn DASEN, MD  Active Self  furosemide  (LASIX ) 40 MG tablet [id number]  Take 40 mg by mouth daily as needed for fluid or edema. [provider]  Active Self  glucose blood (ONETOUCH VERIO) test strip [id number]  Use 2 times daily. [provider]  Active   hydrochlorothiazide  (HYDRODIURIL ) 25 MG tablet [id number]  Take 25 mg by mouth every  morning.  [provider]  Active Self  losartan  (COZAAR ) 100 MG tablet [id number]  Take 100 mg by mouth at bedtime. [provider]  Active Self  Multiple Vitamins-Minerals (CENTRUM SILVER 50+WOMEN) TABS [id number]  Take 1 tablet by mouth daily with breakfast.  [provider]  Active Self  omeprazole  (PRILOSEC) 40 MG capsule [id number]  Take 40 mg by mouth at bedtime. [provider]  Active Self  AISHA SINKS test strip [id number]  2 (two) times daily. [provider]  Active   Polyethyl Glycol-Propyl Glycol (SYSTANE) 0.4-0.3 % GEL ophthalmic gel [id number]  Place 1 application into both eyes at bedtime. [provider]  Active Self  potassium bicarbonate (K-LYTE) 25 MEQ disintegrating tablet [id number]  Take 25 mEq by mouth daily as needed (ONLY WHEN TAKING FUROSEMIDE ).  [provider]  Active Self  tirzepatide  (MOUNJARO ) 5 MG/0.5ML Pen [id number] Yes Inject 5 mg into the skin once a week. Rollene Almarie LABOR, MD  Active               Assessment/Plan:   Diabetes: - Currently controlled, A1c <8%. BG have done very well on mounjaro  5 mg alone - Reviewed long term cardiovascular and renal outcomes of uncontrolled blood sugar - Mounjaro  is $47 per 30 DS with her insurance, no drug deductible on her plan - Recommend to Continue Mounjaro  5 mg weekly - Recommend to check glucose 2-3x per day - Recommended to focus on adequate caloric intake and also hydration   Follow Up Plan: PCP f/u is in 2 weeks, PRN follow up unless other needs arise  Darrelyn Drum, PharmD, BCPS, CPP Clinical Pharmacist Practitioner [facility] [phone]

## 2023-09-26 ENCOUNTER — Encounter: Payer: Self-pay | Admitting: Pharmacist

## 2023-09-26 NOTE — Progress Notes (Signed)
 Pharmacy Quality Measure Review  This patient is appearing on a report for being at risk of failing the adherence measure for cholesterol (statin) medications this calendar year.   Medication: Atorvastatin  and Pravastatin  Last fill date: 90DS for 04/25/23 day supply  Atorvastatin  and pravastatin  were both discontinued due to statin induced myalgias. Upon chart review, statin exclusion code was billed 05/25/23. No further action needed.  Darrelyn Drum, PharmD, BCPS, CPP Clinical Pharmacist Practitioner  Primary [facility] at [facility] [phone]

## 2023-09-27 ENCOUNTER — Ambulatory Visit: Payer: Self-pay | Admitting: Nurse Practitioner

## 2023-09-27 ENCOUNTER — Ambulatory Visit (INDEPENDENT_AMBULATORY_CARE_PROVIDER_SITE_OTHER): Admitting: Nurse Practitioner

## 2023-09-27 ENCOUNTER — Ambulatory Visit: Payer: Self-pay

## 2023-09-27 VITALS — HR 70 | Temp 98.4°F | Ht 60.0 in | Wt 128.4 lb

## 2023-09-27 DIAGNOSIS — M8580 Other specified disorders of bone density and structure, unspecified site: Secondary | ICD-10-CM

## 2023-09-27 DIAGNOSIS — T466X5A Adverse effect of antihyperlipidemic and antiarteriosclerotic drugs, initial encounter: Secondary | ICD-10-CM

## 2023-09-27 DIAGNOSIS — G720 Drug-induced myopathy: Secondary | ICD-10-CM | POA: Diagnosis not present

## 2023-09-27 DIAGNOSIS — E785 Hyperlipidemia, unspecified: Secondary | ICD-10-CM

## 2023-09-27 DIAGNOSIS — R002 Palpitations: Secondary | ICD-10-CM | POA: Diagnosis not present

## 2023-09-27 DIAGNOSIS — N1832 Chronic kidney disease, stage 3b: Secondary | ICD-10-CM

## 2023-09-27 DIAGNOSIS — N183 Chronic kidney disease, stage 3 unspecified: Secondary | ICD-10-CM

## 2023-09-27 DIAGNOSIS — I129 Hypertensive chronic kidney disease with stage 1 through stage 4 chronic kidney disease, or unspecified chronic kidney disease: Secondary | ICD-10-CM

## 2023-09-27 DIAGNOSIS — Z23 Encounter for immunization: Secondary | ICD-10-CM

## 2023-09-27 DIAGNOSIS — E1122 Type 2 diabetes mellitus with diabetic chronic kidney disease: Secondary | ICD-10-CM | POA: Diagnosis not present

## 2023-09-27 LAB — LIPID PANEL
Cholesterol: 231 mg/dL — ABNORMAL HIGH (ref 0–200)
HDL: 42.6 mg/dL (ref >39.00)
LDL Cholesterol: 120 mg/dL — ABNORMAL HIGH (ref 0–99)
NonHDL: 188.42
Total CHOL/HDL Ratio: 5
Triglycerides: 342 mg/dL — ABNORMAL HIGH (ref 0.0–149.0)
VLDL: 68.4 mg/dL — ABNORMAL HIGH (ref 0.0–40.0)

## 2023-09-27 LAB — BASIC METABOLIC PANEL WITH GFR
BUN: 63 mg/dL — ABNORMAL HIGH (ref 6–23)
CO2: 25 meq/L (ref 19–32)
Calcium: 11.7 mg/dL — ABNORMAL HIGH (ref 8.4–10.5)
Chloride: 99 meq/L (ref 96–112)
Creatinine, Ser: 2.89 mg/dL — ABNORMAL HIGH (ref 0.40–1.20)
GFR: 14.61 mL/min — CL (ref >60.00)
Glucose, Bld: 134 mg/dL — ABNORMAL HIGH (ref 70–99)
Potassium: 4.4 meq/L (ref 3.5–5.1)
Sodium: 136 meq/L (ref 135–145)

## 2023-09-27 LAB — HEMOGLOBIN A1C: Hgb A1c MFr Bld: 7.7 % — ABNORMAL HIGH (ref 4.6–6.5)

## 2023-09-27 MED ORDER — TIRZEPATIDE 7.5 MG/0.5ML ~~LOC~~ SOAJ: 7.5000 mg | SUBCUTANEOUS | 3 refills | Status: DC

## 2023-09-27 NOTE — Assessment & Plan Note (Signed)
 Premature ventricular complexes Palpitations due to benign premature ventricular complexes. EKG confirmed. Symptoms possibly stress-related. - Monitor symptoms and consider a 3-day monitor if symptoms persist. Patient declined monitor today. - Follow up with cardiologist as scheduled in October.

## 2023-09-27 NOTE — Assessment & Plan Note (Signed)
 Mixed hyperlipidemia, statin-induced myopathy Continues on Zetia . LDL at goal. Statins intolerable due to myopathy. - Continue Zetia . - Check fasting lipids.

## 2023-09-27 NOTE — Assessment & Plan Note (Signed)
 Osteopenia Diagnosed with osteopenia. Prefers no medication. Vitamin D  adequate. Discussed weight-bearing exercises. - Encourage weight-bearing exercises to maintain muscle mass.

## 2023-09-27 NOTE — Progress Notes (Signed)
 Established Patient Office Visit  Subjective   Patient ID: Angelica French, female    DOB: 27-Oct-1940  Age: 83 y.o. MRN: [id number]  Chief Complaint  Patient presents with   Diabetes    Discussed the use of AI scribe software for clinical note transcription with the patient, who gave verbal consent to proceed.  History of Present Illness Angelica French is an 83 year old female with type 2 diabetes and chronic kidney disease who presents with palpitations and concerns about blood sugar control.  Palpitations - Palpitations present over the last two nights - Heart rhythm described as uneven, with pauses and thumps - Symptoms associated with increased stress from caregiving responsibilities - No associated weakness or dizziness  Hyperglycemia and weight fluctuation - Type 2 diabetes managed with Mounjaro , initially at 2.5 mg and increased to 5 mg - Morning blood glucose readings trending upward especially over the last couple of days/week. Possibly related to caregiver stress and changes in diet - Concern about weight gain, with a three-pound increase after previous weight loss on Mounjaro   Chronic kidney disease and medication concerns - Chronic kidney disease with solitary kidney       Review of Systems  Constitutional:  Negative for malaise/fatigue.  Respiratory:  Negative for shortness of breath.   Cardiovascular:  Positive for palpitations. Negative for chest pain.  Neurological:  Positive for dizziness. Negative for headaches.      Objective:     Pulse 70   Temp 98.4 F (36.9 C) (Temporal)   Ht 5' (1.524 m)   Wt 128 lb 6 oz (58.2 kg)   SpO2 95%   BMI 25.07 kg/m  BP Readings from Last 3 Encounters:  08/02/23 (!) 152/64  05/25/23 138/84  04/25/23 134/86   Wt Readings from Last 3 Encounters:  09/27/23 128 lb 6 oz (58.2 kg)  05/25/23 143 lb 2 oz (64.9 kg)  04/25/23 141 lb 8 oz (64.2 kg)      Physical Exam Vitals reviewed.  Constitutional:       General: She is not in acute distress.    Appearance: Normal appearance.  HENT:     Head: Normocephalic and atraumatic.  Cardiovascular:     Rate and Rhythm: Normal rate and regular rhythm.     Pulses: Normal pulses.     Heart sounds: Normal heart sounds.  Pulmonary:     Effort: Pulmonary effort is normal.     Breath sounds: Normal breath sounds.  Skin:    General: Skin is warm and dry.  Neurological:     General: No focal deficit present.     Mental Status: She is alert and oriented to person, place, and time.  Psychiatric:        Mood and Affect: Mood normal.        Behavior: Behavior normal.        Judgment: Judgment normal.      Results for orders placed or performed in visit on 09/27/23  Lipid panel  Result Value Ref Range   Cholesterol 231 (H) 0 - 200 mg/dL   Triglycerides 657.9 (H) 0.0 - 149.0 mg/dL   HDL 57.39 >60.99 mg/dL   VLDL 31.5 (H) 0.0 - 59.9 mg/dL   LDL Cholesterol 879 (H) 0 - 99 mg/dL   Total CHOL/HDL Ratio 5    NonHDL 188.42   Hemoglobin A1c  Result Value Ref Range   Hgb A1c MFr Bld 7.7 (H) 4.6 - 6.5 %  Basic metabolic panel with  GFR  Result Value Ref Range   Sodium 136 135 - 145 mEq/L   Potassium 4.4 3.5 - 5.1 mEq/L   Chloride 99 96 - 112 mEq/L   CO2 25 19 - 32 mEq/L   Glucose, Bld 134 (H) 70 - 99 mg/dL   BUN 63 (H) 6 - 23 mg/dL   Creatinine, Ser 7.10 (H) 0.40 - 1.20 mg/dL   GFR 85.38 (LL) >39.99 mL/min   Calcium  11.7 (H) 8.4 - 10.5 mg/dL      The ASCVD Risk score (Arnett DK, et al., 2019) failed to calculate for the following reasons:   The 2019 ASCVD risk score is only valid for ages 60 to 81    Assessment & Plan:   Problem List Items Addressed This Visit       Cardiovascular and Mediastinum   Benign hypertension with CKD (chronic kidney disease) stage III (HCC) (Chronic)   Hypertension Blood pressure 144/80, possibly stress-related. On losartan , hydrochlorothiazide , and carvedilol . Carvedilol  reduced due to hypotension, will  restart for palpitations. - Restart carvedilol  in the morning and afternoon. - Monitor blood pressure closely. - Consider lowering losartan  if blood pressure becomes too low.      Relevant Medications   carvedilol  (COREG ) 6.25 MG tablet     Endocrine   Type 2 diabetes mellitus with diabetic chronic kidney disease (HCC) - Primary (Chronic)   Type 2 diabetes mellitus with diabetic chronic kidney disease, single kidney Blood glucose levels increasing, HbA1c target around 7.5. GFR 32.95 with single kidney.  - Increase Mounjaro  to 7.5 mg per week. - Check HbA1c. - Check urine for albuminuria if able to urinate. - Check metabolic panel to monitor kidney function Mixed hyperlipidemia, statin-induced myopathy Continues on Zetia . LDL at goal. Statins intolerable due to myopathy. - Continue Zetia . - Check fasting lipids.      Relevant Medications   tirzepatide  (MOUNJARO ) 7.5 MG/0.5ML Pen   Other Relevant Orders   Basic metabolic panel with GFR (Completed)   Microalbumin / creatinine urine ratio   Hemoglobin A1c (Completed)   Lipid panel (Completed)     Musculoskeletal and Integument   Statin myopathy   Mixed hyperlipidemia, statin-induced myopathy Continues on Zetia . LDL at goal. Statins intolerable due to myopathy. - Continue Zetia . - Check fasting lipids.      Osteopenia   Osteopenia Diagnosed with osteopenia. Prefers no medication. Vitamin D  adequate. Discussed weight-bearing exercises. - Encourage weight-bearing exercises to maintain muscle mass.        Other   Hyperlipidemia   Mixed hyperlipidemia, statin-induced myopathy Continues on Zetia . LDL at goal. Statins intolerable due to myopathy. - Continue Zetia . - Check fasting lipids.      Relevant Medications   carvedilol  (COREG ) 6.25 MG tablet   Palpitations   Premature ventricular complexes Palpitations due to benign premature ventricular complexes. EKG confirmed. Symptoms possibly stress-related. - Monitor  symptoms and consider a 3-day monitor if symptoms persist. Patient declined monitor today. - Follow up with cardiologist as scheduled in October.      Relevant Orders   EKG 12-Lead   Other Visit Diagnoses       Need for vaccination       Relevant Orders   Flu vaccine HIGH DOSE PF(Fluzone Trivalent) (Completed)      Assessment and Plan Assessment & Plan Type 2 diabetes mellitus with diabetic chronic kidney disease, single kidney Blood glucose levels increasing, HbA1c target around 7.5. GFR 32.95 with single kidney.  - Increase Mounjaro  to 7.5 mg per week. -  Check HbA1c. - Check urine for albuminuria if able to urinate. - Check metabolic panel to monitor kidney function  Premature ventricular complexes Palpitations due to benign premature ventricular complexes. EKG confirmed. Symptoms possibly stress-related. - Monitor symptoms and consider a 3-day monitor if symptoms persist. Patient declined monitor today. - Follow up with cardiologist as scheduled in October.  Mixed hyperlipidemia, statin-induced myopathy Continues on Zetia . LDL at goal. Statins intolerable due to myopathy. - Continue Zetia . - Check fasting lipids.  Hypertension Blood pressure 144/80, possibly stress-related. On losartan , hydrochlorothiazide , and carvedilol . Carvedilol  reduced due to hypotension, will restart for palpitations. - Restart carvedilol  in the morning and afternoon. - Monitor blood pressure closely. - Consider lowering losartan  if blood pressure becomes too low.  Osteopenia Diagnosed with osteopenia. Prefers no medication. Vitamin D  adequate. Discussed weight-bearing exercises. - Encourage weight-bearing exercises to maintain muscle mass.  Parathyroid hormone abnormality Parathyroid hormone level 105, elevated. Endocrinologist consultation planned. - Discuss parathyroid hormone levels with endocrinologist during upcoming appointment.  Vaccine -Declines COVID vaccine this year. -  Administer flu shot.   Return in about 3 months (around 12/27/2023) for F/U with Lauraine.    Lauraine FORBES Pereyra, NP

## 2023-09-27 NOTE — Patient Instructions (Signed)
 Fasting Blood Sugar: 80-130 Evening Blood Sugar: <180

## 2023-09-27 NOTE — Assessment & Plan Note (Signed)
 Hypertension Blood pressure 144/80, possibly stress-related. On losartan , hydrochlorothiazide , and carvedilol . Carvedilol  reduced due to hypotension, will restart for palpitations. - Restart carvedilol  in the morning and afternoon. - Monitor blood pressure closely. - Consider lowering losartan  if blood pressure becomes too low.

## 2023-09-27 NOTE — Assessment & Plan Note (Addendum)
 Type 2 diabetes mellitus with diabetic chronic kidney disease, single kidney Blood glucose levels increasing, HbA1c target around 7.5. GFR 32.95 with single kidney.  - Increase Mounjaro  to 7.5 mg per week. - Check HbA1c. - Check urine for albuminuria if able to urinate. - Check metabolic panel to monitor kidney function Mixed hyperlipidemia, statin-induced myopathy Continues on Zetia . LDL at goal. Statins intolerable due to myopathy. - Continue Zetia . - Check fasting lipids.

## 2023-09-27 NOTE — Telephone Encounter (Signed)
 FYI Only or Action Required?: FYI only for provider.  Patient was last seen in primary care on 09/27/2023 by Elnor Lauraine BRAVO, NP.  Called Nurse Triage reporting Advice Only and Results. - critical lab result  Symptoms began  .  Interventions attempted:  Called provider to report.  Symptoms are: na.  Triage Disposition: Call PCP Now  Patient/caregiver understands and will follow disposition?: Yes                  Copied from CRM [id number]. Topic: Clinical - Lab/Test Results >> Sep 27, 2023  5:11 PM Charolett L wrote: Reason for CRM: [place] from lab office reporting  A critical lab for patient Reason for Disposition  Lab or radiology calling with CRITICAL test results  Answer Assessment - Initial Assessment Questions 1. REASON FOR CALL: What is the main reason for your call? or How can I best help you?     Critical lab result  Answer Assessment - Initial Assessment Questions Took Critical lab results from [place] of GFR of 14.61. Sent message to provider via Epic. Called provider as she requested and shared critical lab result.      1. REASON FOR CALL or QUESTION: What is your reason for calling today? or How can I best     Critical lab results 2. CALLER: Document the source of call. (e.g., laboratory staff, caregiver or patient).     Lab  Protocols used: Information Only Call - No Triage-A-AH, PCP Call - No Triage-A-AH

## 2023-09-28 ENCOUNTER — Emergency Department (HOSPITAL_COMMUNITY)
Admission: EM | Admit: 2023-09-28 | Discharge: 2023-09-28 | Disposition: A | Discharge status: Home / Self Care | Source: Ambulatory Visit | Attending: Emergency Medicine | Admitting: Emergency Medicine

## 2023-09-28 ENCOUNTER — Other Ambulatory Visit: Payer: Self-pay | Admitting: Nurse Practitioner

## 2023-09-28 ENCOUNTER — Telehealth: Payer: Self-pay | Admitting: Nurse Practitioner

## 2023-09-28 DIAGNOSIS — E1122 Type 2 diabetes mellitus with diabetic chronic kidney disease: Secondary | ICD-10-CM | POA: Diagnosis not present

## 2023-09-28 DIAGNOSIS — Z79899 Other long term (current) drug therapy: Secondary | ICD-10-CM | POA: Insufficient documentation

## 2023-09-28 DIAGNOSIS — N179 Acute kidney failure, unspecified: Secondary | ICD-10-CM | POA: Insufficient documentation

## 2023-09-28 DIAGNOSIS — R7989 Other specified abnormal findings of blood chemistry: Secondary | ICD-10-CM | POA: Diagnosis present

## 2023-09-28 DIAGNOSIS — N189 Chronic kidney disease, unspecified: Secondary | ICD-10-CM | POA: Insufficient documentation

## 2023-09-28 LAB — COMPREHENSIVE METABOLIC PANEL WITH GFR
ALT: 10 U/L (ref 0–44)
AST: 21 U/L (ref 15–41)
Albumin: 3.6 g/dL (ref 3.5–5.0)
Alkaline Phosphatase: 57 U/L (ref 38–126)
Anion gap: 15 (ref 5–15)
BUN: 53 mg/dL — ABNORMAL HIGH (ref 8–23)
CO2: 21 mmol/L — ABNORMAL LOW (ref 22–32)
Calcium: 10.8 mg/dL — ABNORMAL HIGH (ref 8.9–10.3)
Chloride: 101 mmol/L (ref 98–111)
Creatinine, Ser: 2.14 mg/dL — ABNORMAL HIGH (ref 0.44–1.00)
GFR, Estimated: 22 mL/min — ABNORMAL LOW (ref >60)
Glucose, Bld: 154 mg/dL — ABNORMAL HIGH (ref 70–99)
Potassium: 3.7 mmol/L (ref 3.5–5.1)
Sodium: 137 mmol/L (ref 135–145)
Total Bilirubin: 0.6 mg/dL (ref 0.0–1.2)
Total Protein: 6.6 g/dL (ref 6.5–8.1)

## 2023-09-28 LAB — URINALYSIS, ROUTINE W REFLEX MICROSCOPIC
Bacteria, UA: NONE SEEN
Bilirubin Urine: NEGATIVE
Glucose, UA: NEGATIVE mg/dL
Hgb urine dipstick: NEGATIVE
Ketones, ur: NEGATIVE mg/dL
Nitrite: NEGATIVE
Protein, ur: NEGATIVE mg/dL
Specific Gravity, Urine: 1.008 (ref 1.005–1.030)
pH: 7 (ref 5.0–8.0)

## 2023-09-28 LAB — CBC
HCT: 38.6 % (ref 36.0–46.0)
Hemoglobin: 12.2 g/dL (ref 12.0–15.0)
MCH: 28.1 pg (ref 26.0–34.0)
MCHC: 31.6 g/dL (ref 30.0–36.0)
MCV: 88.9 fL (ref 80.0–100.0)
Platelets: 188 K/uL (ref 150–400)
RBC: 4.34 MIL/uL (ref 3.87–5.11)
RDW: 15.8 % — ABNORMAL HIGH (ref 11.5–15.5)
WBC: 10.2 K/uL (ref 4.0–10.5)
nRBC: 0 % (ref 0.0–0.2)

## 2023-09-28 NOTE — ED Triage Notes (Signed)
 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

## 2023-09-28 NOTE — ED Provider Notes (Signed)
 [place] EMERGENCY DEPARTMENT AT [facility] Provider Note   CSN: [id number] Arrival date & time: 09/28/23  1203     Patient presents with: Abnormal Labs   Angelica French is a 83 y.o. female.   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  Patient reports overall she has been doing well and today is a very good day and she feels great but sometimes she will be tired and her symptoms can wax and wane.  She denies any pain, nausea, vomiting.  She is producing a good amount of stool and urine.  She did start Mounjaro  back in July and reports for a long time she felt generally weak and dropped a lot of weight fast but more recently she has been feeling a lot better.  She did increase of her Mounjaro  yesterday but that has been the only new and changing medication.  The history is provided by the patient.       Prior to Admission medications   Medication Sig Start Date End Date Taking? Authorizing Provider  acetaminophen  (TYLENOL ) 500 MG tablet Take 1,000 mg by mouth every 6 (six) hours as needed (for pain).    [provider]  Biotin  2500 MCG CAPS Take 2,500 mcg by mouth daily.     [provider]  carvedilol  (COREG ) 6.25 MG tablet Take 6.25 mg by mouth 2 (two) times daily with a meal. Patient is not sure of dose    [provider]  Cholecalciferol  (VITAMIN D -3) 25 MCG (1000 UT) CAPS Take 1,000 Units by mouth every other day.    [provider]  clindamycin  (CLEOCIN ) 2 % vaginal cream Place 1 Applicatorful vaginally daily as  needed (irritation).  02/16/15   [provider]  Desoximetasone  0.05 % GEL Apply 1 application topically daily as needed (ostomy pouch irritation). 06/28/17   Nitka, James E, MD  ezetimibe  (ZETIA ) 10 MG tablet Take 1 tablet (10 mg total) by mouth daily. 05/25/23   Elnor Lauraine BRAVO, NP  famotidine  (PEPCID ) 20 MG tablet Take 1 tablet (20 mg total) by mouth 2 (two) times daily. 02/01/18   Aneita Gwendlyn DASEN, MD  furosemide  (LASIX ) 40 MG tablet Take 40 mg by mouth daily as needed for fluid or edema.    [provider]  glucose blood (ONETOUCH VERIO) test strip Use 2 times daily. 04/05/22   [provider]  hydrochlorothiazide  (HYDRODIURIL ) 25 MG tablet Take 25 mg by mouth every morning.  02/23/17   [provider]  losartan  (COZAAR ) 100 MG tablet Take 100 mg by mouth at bedtime.    [provider]  Multiple Vitamins-Minerals (CENTRUM SILVER 50+WOMEN) TABS Take 1 tablet by mouth daily with breakfast.     [provider]  omeprazole  (PRILOSEC) 40 MG capsule Take 40 mg by mouth at bedtime. 03/25/18   [provider]  [facility] VERIO test strip 2 (two) times daily.    [provider]  Polyethyl Glycol-Propyl Glycol (SYSTANE) 0.4-0.3 % GEL ophthalmic gel Place 1 application  into both eyes at bedtime.    [provider]  potassium bicarbonate (K-LYTE) 25 MEQ disintegrating tablet Take 25 mEq by mouth daily as needed (ONLY WHEN TAKING FUROSEMIDE ).     [provider]  tirzepatide  (MOUNJARO ) 7.5 MG/0.5ML Pen Inject 7.5 mg into the skin once a week. 09/27/23   Elnor Lauraine BRAVO, NP    Allergies: Nsaids, Tape, Aspirin , Atorvastatin , Coreg  [carvedilol ], and Pravastatin     Review of Systems  Updated Vital Signs BP (!) 180/112   Pulse 71   Temp 97.8 F (36.6 C) (Oral)   Resp 18   Ht 5' (1.524 m)   SpO2 94%   BMI 25.07 kg/m   Physical Exam Vitals and nursing note reviewed.  Constitutional:      General: She is not in acute  distress.    Appearance: She is well-developed.  HENT:     Head: Normocephalic and atraumatic.  Eyes:     Pupils: Pupils are equal, round, and reactive to light.  Cardiovascular:     Rate and Rhythm: Normal rate and regular rhythm.     Heart sounds: Normal heart sounds. No murmur heard.    No friction rub.  Pulmonary:     Effort: Pulmonary effort is normal.     Breath sounds: Normal breath sounds. No wheezing or rales.  Abdominal:     General: Bowel sounds are normal. There is no distension.     Palpations: Abdomen is soft.     Tenderness: There is no abdominal tenderness. There is no guarding or rebound.     Comments: Ostomy bag present with stool  Musculoskeletal:        General: No tenderness. Normal range of motion.     Right lower leg: No edema.     Left lower leg: No edema.     Comments: No edema  Skin:    General: Skin is warm and dry.     Findings: No rash.  Neurological:     Mental Status: She is alert and oriented to person, place, and time.     Cranial Nerves: No cranial nerve deficit.  Psychiatric:        Behavior: Behavior normal.     (all labs ordered are listed, but only abnormal results are displayed) Labs Reviewed  CBC - Abnormal; Notable for the following components:      Result Value   RDW 15.8 (*)    All other components within normal limits  URINALYSIS, ROUTINE W REFLEX MICROSCOPIC - Abnormal; Notable for the following components:   APPearance HAZY (*)    Leukocytes,Ua TRACE (*)    All other components within normal limits  COMPREHENSIVE METABOLIC PANEL WITH GFR - Abnormal; Notable for the following components:   CO2 21 (*)    Glucose, Bld 154 (*)    BUN 53 (*)    Creatinine, Ser 2.14 (*)    Calcium  10.8 (*)    GFR, Estimated 22 (*)    All other components within normal limits    EKG: None  Radiology: No results found.   Procedures   Medications Ordered in the ED - No data to display                                  Medical  Decision Making Amount and/or Complexity of Data Reviewed Labs: ordered. Decision-making details documented in ED Course.   Pt with multiple medical problems and  comorbidities and presenting today with a complaint that caries a high risk for morbidity and mortality.  Here today due to abnormal labs but she does not have any specific complaints.  She does report that she does not drink as much fluid as she knows she should but has not had any nausea vomiting and has been trying to stay hydrated.  She has been compliant with her medications.  She has no obvious signs of fluid overload at this time.  Last creatinine done by [facility] who is her PCP shows a creatinine of 1.46.  I independently interpreted patient's labs and CMP today with creatinine of 2.14 with a GFR of 22 which is improved from the lab test done yesterday which is 2.89.  CBC without acute findings, UA is normal. Spoke with patient's PCP Lauraine Pereyra with [facility] and the patient and her husband desire to go home.  She reports she drinks less than 30 ounces of fluid a day and suspect that her renal function could be related to dehydration as they had not checked 1 since she had Mounjaro  started.  They will follow-up on a new chemistry on Monday and patient will start drinking more liquids.  Also they are going to order an outpatient renal ultrasound to ensure normal flow.  She has an appointment with the office next week as well.  Also discussed with she and her husband if anything changes or she starts feeling unwell she can always return to the emergency room.  They are comfortable with this plan and she was discharged in stable condition.     Final diagnoses:  AKI (acute kidney injury)    ED Discharge Orders     None          Doretha Folks, MD 09/28/23 1545

## 2023-09-28 NOTE — Discharge Instructions (Addendum)
 So you need to start drinking 60 ounces of liquid per day and your doctor has already ordered an ultrasound that they will call you next week about for when and where you are supposed to go to receive that.  Also you need to go to the office on Monday to get your blood redrawn to make sure your kidney function continues to improve.  If over the weekend you start feeling worse or you stop making urine altogether or have any other concerns return to the emergency room.

## 2023-09-28 NOTE — Telephone Encounter (Signed)
 Copied from CRM [id number]. Topic: General - Other >> Sep 28, 2023  3:20 PM Lauren C wrote: Reason for CRM: Dr. Doretha from [facility] on the line requesting to speak with Angelica French or her nurse regarding this patient. Requesting call back as soon as possible at [phone]   I was able to speak to Dr. Doretha and per shared decision making with the patient, myself and Dr. Doretha we will move forward with US  of kidney as an outpatient, patient will focus on oral rehydration over the weekend, and she will follow-up for repeat BMP early next week with in-person visit on 10/05/23

## 2023-09-28 NOTE — ED Notes (Signed)
 Pt able to ambulate with a steady gait and without assistance to the restroom to obtain urine specimen.

## 2023-10-01 ENCOUNTER — Other Ambulatory Visit (INDEPENDENT_AMBULATORY_CARE_PROVIDER_SITE_OTHER)

## 2023-10-01 DIAGNOSIS — N179 Acute kidney failure, unspecified: Secondary | ICD-10-CM

## 2023-10-01 LAB — BASIC METABOLIC PANEL WITH GFR
BUN: 33 mg/dL — ABNORMAL HIGH (ref 6–23)
CO2: 26 meq/L (ref 19–32)
Calcium: 10.7 mg/dL — ABNORMAL HIGH (ref 8.4–10.5)
Chloride: 98 meq/L (ref 96–112)
Creatinine, Ser: 1.84 mg/dL — ABNORMAL HIGH (ref 0.40–1.20)
GFR: 25.11 mL/min — ABNORMAL LOW (ref >60.00)
Glucose, Bld: 147 mg/dL — ABNORMAL HIGH (ref 70–99)
Potassium: 3.8 meq/L (ref 3.5–5.1)
Sodium: 133 meq/L — ABNORMAL LOW (ref 135–145)

## 2023-10-01 NOTE — Telephone Encounter (Signed)
 Pt was made aware of critical lab call and was advise to got to the ED.

## 2023-10-02 ENCOUNTER — Ambulatory Visit: Payer: Self-pay | Admitting: Nurse Practitioner

## 2023-10-03 ENCOUNTER — Ambulatory Visit (HOSPITAL_COMMUNITY)
Admission: RE | Admit: 2023-10-03 | Discharge: 2023-10-03 | Disposition: A | Discharge status: Home / Self Care | Source: Ambulatory Visit | Attending: Nurse Practitioner | Admitting: Nurse Practitioner

## 2023-10-03 DIAGNOSIS — N179 Acute kidney failure, unspecified: Secondary | ICD-10-CM | POA: Insufficient documentation

## 2023-10-05 ENCOUNTER — Ambulatory Visit (INDEPENDENT_AMBULATORY_CARE_PROVIDER_SITE_OTHER): Admitting: Nurse Practitioner

## 2023-10-05 VITALS — BP 106/70 | HR 64 | Temp 98.2°F | Ht 60.0 in | Wt 127.4 lb

## 2023-10-05 DIAGNOSIS — E1122 Type 2 diabetes mellitus with diabetic chronic kidney disease: Secondary | ICD-10-CM

## 2023-10-05 DIAGNOSIS — N179 Acute kidney failure, unspecified: Secondary | ICD-10-CM | POA: Diagnosis not present

## 2023-10-05 DIAGNOSIS — Z7985 Long-term (current) use of injectable non-insulin antidiabetic drugs: Secondary | ICD-10-CM | POA: Diagnosis not present

## 2023-10-05 DIAGNOSIS — R5383 Other fatigue: Secondary | ICD-10-CM | POA: Diagnosis not present

## 2023-10-05 DIAGNOSIS — N1832 Chronic kidney disease, stage 3b: Secondary | ICD-10-CM

## 2023-10-05 DIAGNOSIS — M549 Dorsalgia, unspecified: Secondary | ICD-10-CM

## 2023-10-05 DIAGNOSIS — G8929 Other chronic pain: Secondary | ICD-10-CM | POA: Diagnosis not present

## 2023-10-05 LAB — CBC
HCT: 34.3 % — ABNORMAL LOW (ref 36.0–46.0)
Hemoglobin: 11.7 g/dL — ABNORMAL LOW (ref 12.0–15.0)
MCHC: 34.2 g/dL (ref 30.0–36.0)
MCV: 84.6 fl (ref 78.0–100.0)
Platelets: 154 K/uL (ref 150.0–400.0)
RBC: 4.06 Mil/uL (ref 3.87–5.11)
RDW: 15.9 % — ABNORMAL HIGH (ref 11.5–15.5)
WBC: 8 K/uL (ref 4.0–10.5)

## 2023-10-05 LAB — BASIC METABOLIC PANEL WITH GFR
BUN: 44 mg/dL — ABNORMAL HIGH (ref 6–23)
CO2: 23 meq/L (ref 19–32)
Calcium: 10 mg/dL (ref 8.4–10.5)
Chloride: 96 meq/L (ref 96–112)
Creatinine, Ser: 2.31 mg/dL — ABNORMAL HIGH (ref 0.40–1.20)
GFR: 19.11 mL/min — ABNORMAL LOW (ref >60.00)
Glucose, Bld: 117 mg/dL — ABNORMAL HIGH (ref 70–99)
Potassium: 4 meq/L (ref 3.5–5.1)
Sodium: 131 meq/L — ABNORMAL LOW (ref 135–145)

## 2023-10-05 LAB — IRON: Iron: 70 ug/dL (ref 42–145)

## 2023-10-05 LAB — FERRITIN: Ferritin: 78.3 ng/mL (ref 10.0–291.0)

## 2023-10-05 LAB — VITAMIN B12: Vitamin B-12: 395 pg/mL (ref 211–911)

## 2023-10-05 LAB — TSH: TSH: 3.85 u[IU]/mL (ref 0.35–5.50)

## 2023-10-05 MED ORDER — TRAMADOL HCL 50 MG PO TABS: 50.0000 mg | ORAL_TABLET | Freq: Two times a day (BID) | ORAL | 0 refills | Status: AC | PRN

## 2023-10-05 NOTE — Assessment & Plan Note (Signed)
  Chronic mid-back pain Intermittent pain. Mounjaro  increases gallstone risk, but pain predates use. Thus, less likely to be referred pain from gallbladder. Discussed imaging of abdomen, patient declines.  - Prescribe Tylenol  500 mg every 8 hours as needed. - Patient requesting tramadol  for pain management. Prescribe Tramadol  50mg  as needed, one every 12 hours, #6 tablets. If she requires long-term use of tramadol  will refer to pain management.  - Monitor for fever, chills, nausea, vomiting, or worsening pain. If these occur will highly recommend moving forward with imaging.

## 2023-10-05 NOTE — Progress Notes (Signed)
 Established Patient Office Visit  Subjective   Patient ID: Angelica French, female    DOB: 12-Mar-1940  Age: 83 y.o. MRN: [id number]  Chief Complaint  Patient presents with   Fatigue    Discussed the use of AI scribe software for clinical note transcription with the patient, who gave verbal consent to proceed.  History of Present Illness Angelica French is an 83 year old female with chronic kidney disease who presents with fatigue and back pain.  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; this is not a new finding - Maintains fluid intake of 60 ounces per day - Takes hydrochlorothiazide  regularly - Uses furosemide  as needed, but has not used it in four months -Follows with nephrology  Back pain - Pain located between the shoulder blades, radiating to the shoulder blades - Severity rated as 6 out of 10 - Intermittent pain, not present at the time of visit - No associated abdominal pain, fever, or chills - Back pain predates use of Mounjaro  - No history of gallbladder disease - Previously used tramadol  for pain without issues  Cardiopulmonary symptoms - No chest pain - No muscle weakness - No significant shortness of breath, but occasional exertional dyspnea when carrying groceries - No lower extremity swelling  Mood and affect - Mood is stable - No depression    Estimated Creatinine Clearance: 18.4 mL/min (A) (by C-G formula based on SCr of 1.84 mg/dL (H)).     ROS: see HPI    Objective:     BP 106/70    Pulse 64   Temp 98.2 F (36.8 C) (Temporal)   Ht 5' (1.524 m)   Wt 127 lb 6 oz (57.8 kg)   SpO2 97%   BMI 24.88 kg/m  BP Readings from Last 3 Encounters:  10/05/23 106/70  09/28/23 (!) 167/83  08/02/23 (!) 152/64   Wt Readings from Last 3 Encounters:  10/05/23 127 lb 6 oz (57.8 kg)  09/27/23 128 lb 6 oz (58.2 kg)  05/25/23 143 lb 2 oz (64.9 kg)      Physical Exam Vitals reviewed.  Constitutional:      General: She is not in acute distress.    Appearance: Normal appearance.  HENT:     Head: Normocephalic and atraumatic.  Cardiovascular:     Rate and Rhythm: Normal rate and regular rhythm.     Pulses: Normal pulses.     Heart sounds: Normal heart sounds.  Pulmonary:     Effort: Pulmonary effort is normal.     Breath sounds: Normal breath sounds.  Musculoskeletal:        General: No swelling.  Skin:    General: Skin is warm and dry.  Neurological:     General: No focal deficit present.     Mental Status: She is alert and oriented to person, place, and time.  Psychiatric:        Mood and Affect: Mood normal.        Behavior: Behavior normal.  Judgment: Judgment normal.      No results found for any visits on 10/05/23.    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 Rapid weight loss and fatigue may be Mounjaro -related. Blood sugar levels fluctuating. - Check kidney function with blood work.Continue to follow-up with nephrology. - Maintain current Mounjaro  dose per patient preference. Monitor weight loss closely. If further weight loss occurs would recommend reducing dose of mounjaro .  -Continue ARB,  zetia  - Consult with endocrinologist in two weeks.      Relevant Orders   TSH   CBC   Ferritin   Iron    Vitamin B12     Genitourinary   AKI (acute kidney injury) - Primary   Type 2 diabetes mellitus with diabetic chronic kidney disease  GFR improving. Rapid weight loss and fatigue may be Mounjaro -related. Blood sugar levels fluctuating. - Check kidney function with blood work.Continue to follow-up with nephrology. - Maintain current Mounjaro  dose per patient preference. Monitor weight loss closely. If further weight loss occurs would recommend reducing dose of mounjaro .  -Continue ARB, zetia  - Consult with endocrinologist in two weeks.      Relevant Orders   Basic metabolic panel with GFR   TSH   CBC   Ferritin   Iron    Vitamin B12     Other   Fatigue   Fatigue Worsened fatigue possibly due to weight loss and dehydration. Differential includes anemia, thyroid  dysfunction, depression, anxiety, autoimmune disease, and B12 deficiency. - Check labs for further evaluation.       Relevant Orders   TSH   CBC   Ferritin   Iron    Vitamin B12   Chronic mid back pain    Chronic mid-back pain Intermittent pain. Mounjaro  increases gallstone risk, but pain predates use. Thus, less likely to be referred pain from gallbladder. Discussed imaging of abdomen, patient declines.  - Prescribe Tylenol  500 mg every 8 hours as needed. - Patient requesting tramadol  for pain management. Prescribe Tramadol  50mg  as needed, one every 12 hours, #6 tablets. If she requires long-term use of tramadol  will refer to pain management.  - Monitor for fever, chills, nausea, vomiting, or worsening pain. If these occur will highly recommend moving forward with imaging.      Relevant Medications   traMADol  (ULTRAM ) 50 MG tablet  Assessment and Plan Assessment & Plan Type 2 diabetes mellitus with diabetic chronic kidney disease  GFR improving. Rapid weight loss and fatigue may be Mounjaro -related. Blood  sugar levels fluctuating. - Check kidney function with blood work.Continue to follow-up with nephrology. - Maintain current Mounjaro  dose per patient preference. Monitor weight loss closely. If further weight loss occurs would recommend reducing dose of mounjaro .  -Continue ARB, zetia  - Consult with endocrinologist in two weeks.  Fatigue Worsened fatigue possibly due to weight loss and dehydration. Differential includes anemia, thyroid  dysfunction, depression, anxiety, autoimmune disease, and B12 deficiency. - Check labs for further evaluation.   Chronic mid-back pain Intermittent pain. Mounjaro  increases gallstone risk, but pain predates use. Thus, less likely to be referred pain from gallbladder. Discussed imaging  of abdomen, patient declines.  - Prescribe Tylenol  500 mg every 8 hours as needed. - Patient requesting tramadol  for pain management. Prescribe Tramadol  50mg  as needed, one every 12 hours, #6 tablets. If she requires long-term use of tramadol  will refer to pain management.  - Monitor for fever, chills, nausea, vomiting, or worsening pain. If these occur will highly recommend moving forward with imaging.    Return for as scheduled.    Lauraine FORBES Pereyra, NP

## 2023-10-05 NOTE — Assessment & Plan Note (Signed)
 Fatigue Worsened fatigue possibly due to weight loss and dehydration. Differential includes anemia, thyroid  dysfunction, depression, anxiety, autoimmune disease, and B12 deficiency. - Check labs for further evaluation.

## 2023-10-05 NOTE — Assessment & Plan Note (Signed)
 Type 2 diabetes mellitus with diabetic chronic kidney disease  GFR improving. Rapid weight loss and fatigue may be Mounjaro -related. Blood sugar levels fluctuating. - Check kidney function with blood work.Continue to follow-up with nephrology. - Maintain current Mounjaro  dose per patient preference. Monitor weight loss closely. If further weight loss occurs would recommend reducing dose of mounjaro .  -Continue ARB, zetia  - Consult with endocrinologist in two weeks.

## 2023-10-05 NOTE — Assessment & Plan Note (Addendum)
 Type 2 diabetes mellitus with diabetic chronic kidney disease  GFR improving. Rapid weight loss and fatigue may be Mounjaro -related. Blood sugar levels fluctuating. - Check kidney function with blood work.Continue to follow-up with nephrology. - Maintain current Mounjaro  dose per patient preference. Monitor weight loss closely. If further weight loss occurs would recommend reducing dose of mounjaro .  -Continue ARB, zetia  - Consult with endocrinologist in two weeks.

## 2023-10-06 ENCOUNTER — Ambulatory Visit: Payer: Self-pay | Admitting: Nurse Practitioner

## 2023-10-06 DIAGNOSIS — N179 Acute kidney failure, unspecified: Secondary | ICD-10-CM

## 2023-10-06 MED ORDER — FAMOTIDINE 20 MG PO TABS: 20.0000 mg | ORAL_TABLET | ORAL | Status: DC

## 2023-10-08 ENCOUNTER — Other Ambulatory Visit: Payer: Self-pay | Admitting: Nurse Practitioner

## 2023-10-12 ENCOUNTER — Other Ambulatory Visit

## 2023-10-12 ENCOUNTER — Ambulatory Visit: Payer: Self-pay | Admitting: Nurse Practitioner

## 2023-10-12 DIAGNOSIS — N179 Acute kidney failure, unspecified: Secondary | ICD-10-CM | POA: Diagnosis not present

## 2023-10-12 LAB — BASIC METABOLIC PANEL WITH GFR
BUN: 32 mg/dL — ABNORMAL HIGH (ref 6–23)
CO2: 22 meq/L (ref 19–32)
Calcium: 9.6 mg/dL (ref 8.4–10.5)
Chloride: 101 meq/L (ref 96–112)
Creatinine, Ser: 1.6 mg/dL — ABNORMAL HIGH (ref 0.40–1.20)
GFR: 29.69 mL/min — ABNORMAL LOW (ref >60.00)
Glucose, Bld: 134 mg/dL — ABNORMAL HIGH (ref 70–99)
Potassium: 3.8 meq/L (ref 3.5–5.1)
Sodium: 135 meq/L (ref 135–145)

## 2023-10-15 DIAGNOSIS — N183 Chronic kidney disease, stage 3 unspecified: Secondary | ICD-10-CM | POA: Diagnosis not present

## 2023-10-15 DIAGNOSIS — I701 Atherosclerosis of renal artery: Secondary | ICD-10-CM | POA: Diagnosis not present

## 2023-10-15 DIAGNOSIS — I493 Ventricular premature depolarization: Secondary | ICD-10-CM | POA: Diagnosis not present

## 2023-10-15 DIAGNOSIS — E782 Mixed hyperlipidemia: Secondary | ICD-10-CM | POA: Diagnosis not present

## 2023-10-15 DIAGNOSIS — I129 Hypertensive chronic kidney disease with stage 1 through stage 4 chronic kidney disease, or unspecified chronic kidney disease: Secondary | ICD-10-CM | POA: Diagnosis not present

## 2023-10-22 ENCOUNTER — Ambulatory Visit: Admitting: Endocrinology

## 2023-10-22 ENCOUNTER — Encounter: Payer: Self-pay | Admitting: Endocrinology

## 2023-10-22 VITALS — BP 170/80 | HR 72 | Resp 16 | Ht 60.0 in | Wt 128.2 lb

## 2023-10-22 DIAGNOSIS — E1122 Type 2 diabetes mellitus with diabetic chronic kidney disease: Secondary | ICD-10-CM | POA: Diagnosis not present

## 2023-10-22 DIAGNOSIS — E1169 Type 2 diabetes mellitus with other specified complication: Secondary | ICD-10-CM

## 2023-10-22 DIAGNOSIS — N184 Chronic kidney disease, stage 4 (severe): Secondary | ICD-10-CM | POA: Diagnosis not present

## 2023-10-22 DIAGNOSIS — Z7985 Long-term (current) use of injectable non-insulin antidiabetic drugs: Secondary | ICD-10-CM | POA: Diagnosis not present

## 2023-10-22 MED ORDER — ONETOUCH VERIO VI STRP: ORAL_STRIP | 3 refills | Status: AC

## 2023-10-22 NOTE — Progress Notes (Unsigned)
 Outpatient Endocrinology Note [place] Tarek Cravens, MD   Patient's Name: Angelica French    DOB: 1940-11-26    MRN: [id number]                                                    REASON OF VISIT: New consult for type 2 diabetes mellitus  REFERRING PROVIDER: Elnor Lauraine BRAVO, NP  PCP: Elnor Lauraine BRAVO, NP  HISTORY OF PRESENT ILLNESS:   Angelica French is a 83 y.o. old female with past medical history listed below, is here for new consult for type 2 diabetes mellitus.   Pertinent Diabetes History:  Patient is referred to endocrinology for evaluation and management of type 2 diabetes mellitus.  Initial consult on October 23, 2023.  Patient was diagnosed with type 2 diabetes mellitus around 2010 timeframe. Patient has uncontrolled type 2 diabetes mellitus, with hemoglobin A1c 7.5 to 8.4% range.  Patient used to be on Victoza  and metformin in the past.  She reports she had reasonable control of diabetes mellitus on Victoza , she used to follow-up with endocrinology at [facility] in the past.  Metformin was stopped due to GI intolerance.  Later Victoza  was stopped due to GI intolerance as well.  Then she was treated with basal insulin /Tresiba, she reports she had variable blood sugar with weight gain.  GLP-1 receptor agonist Mounjaro  was started and Tresiba was gradually tapered and was stopped in July 2025.  She had been on Tresiba up to 20 units daily.  Mounjaro  was started in June 2025.  History of DKA or diabetes related hospitalizations: none  Previous diabetes education: Yes   No personal history of pancreatitis and / or family history of medullary thyroid  carcinoma or MEN 2B syndrome.   Chronic Diabetes Complications : Retinopathy: no. Last ophthalmology exam was done on annually, following with ophthalmology regularly.  Reportedly. 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   Peripheral neuropathy: no Coronary artery disease: no Stroke: no  Relevant comorbidities and cardiovascular risk factors: Obesity: no Body mass index is 25.04 kg/m.  Hypertension: Yes  Hyperlipidemia : Yes, not on statin.  On Zetia .  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  Had taken atorvastatin  in the past.  Current / Home Diabetic regimen includes:  Mounjaro  7.5 mg weekly.  Prior diabetic medications: Victoza  in the past stopped due to GI intolerance.  Metformin was stopped due to renal insufficiency and diarrhea.  Was on Tresiba transition to GLP-1 receptor agonist/Mounjaro .  Glycemic data:    Angelica French's glucose meter is computer downloaded. Raw data and trends analyzed.  One Touch Verio reflect glucometer, average blood sugar in last October 6 to October 24, 2023 was 120.  Checking blood sugar usually 2 times a day.  Lowest blood sugar 90, highest blood sugar 154.  Fasting blood sugar mostly stable in low 100 from 105-120 range.  Blood sugar in the bedtime mostly acceptable some of the bedtime blood sugar 124, 120, 119, 129, 147, 135.  Hypoglycemia: Patient has no hypoglycemic episodes. Patient has hypoglycemia awareness.  Factors modifying glucose control: 1.  Diabetic diet assessment: Usually 2 meals a day lunch and supper.  Bedtime snack maybe once a week.  2.  Staying active or exercising:  3.  Medication compliance: compliant all of the time.  Interval history  Patient presented to establish diabetes care.  She is currently taking Mounjaro  7.5 mg weekly and denies GI issues tolerating well, she reports she was able to lose about 18 pounds of weight after being on Mounjaro .  Hemoglobin A1c last month was 7.7%.  Blood sugar/glucometer data mostly acceptable with average blood sugar of 120 and no significant hyperglycemia or hypoglycemia.  She reports her appetite is reasonable.  She initially had concern of weight loss and significant appetite loss.  Lately  she has reasonable appetite and she is feeling comfortable staying on current Mounjaro .  She has question regarding occasional high blood sugar in the morning fasting.  Discussed that there can be various reasons including eating at bedtime/snack or other factors for example any stress in the body, stress hormones for example cortisol and adrenaline type hormone causing elevated blood sugar in the morning.  We discussed that we need to go by trend of blood sugar.  She is not having elevated blood sugar every day in the morning.  REVIEW OF SYSTEMS As per history of present illness.   PAST MEDICAL HISTORY: Past Medical History:  Diagnosis Date   Arthritis    Bleeding ulcer    CHF (congestive heart failure) (HCC)    Chronic kidney disease    Renal artery stenosis - right. Left kindey ok- sees Dr Carlette- nepjrologist   Diabetes mellitus without complication (HCC)    type II   Fundic gland polyps of stomach, benign    Gastric ulcer 1994   bleeding   History of blood transfusion    1970- illeostomy 2004-   Hypertension    UC (ulcerative colitis) (HCC)    VRE (vancomycin resistant enterococcus) culture positive 2004    PAST SURGICAL HISTORY: Past Surgical History:  Procedure Laterality Date   ABDOMINAL HYSTERECTOMY     ABSCESS DRAINAGE     abdominal   BACK SURGERY Left 1997   Left 5 DISECTOMY   BLADDER SUSPENSION  1998   EYE SURGERY Bilateral    cataract   HERNIA MESH REMOVAL  2004   relocation of ileostomy to opposite side of abdomen   PERMANENT ILEOSTOMY  1970   stoma hernia  2004   attempted- resulting in perforated bowel and infection   stoma; Hernia repair     2003- attempted   TOTAL KNEE ARTHROPLASTY Left 04/13/2017   Procedure: LEFT TOTAL KNEE ARTHROPLASTY AND RIGHT KNEE INTRA-ARTICULAR STEROID INJECTION;  Surgeon: Lucilla Lynwood BRAVO, MD;  Location: [facility] OR;  Service: Orthopedics;  Laterality: Left;   TOTAL KNEE ARTHROPLASTY Right 06/25/2017   TOTAL KNEE ARTHROPLASTY Right  06/25/2017   Procedure: RIGHT TOTAL KNEE ARTHROPLASTY;  Surgeon: Lucilla Lynwood BRAVO, MD;  Location: [facility] OR;  Service: Orthopedics;  Laterality: Right;    ALLERGIES: Allergies  Allergen Reactions   Nsaids Nausea Only and Other (See Comments)    GI pain after prolonged use = ULCERS     Tape Hives and Itching   Aspirin  Other (See Comments)    No buffered aspirin  = GI pain that develops into ulcers   Atorvastatin      Myopathy    Coreg  [Carvedilol ] Other (See Comments)    Makes my back hurt   Pravastatin      Myopathy     FAMILY HISTORY:  Family History  Problem Relation Age of Onset   Heart disease Father     SOCIAL HISTORY: Social History   Socioeconomic History  Marital status: Widowed    Spouse name: Not on file   Number of children: Not on file   Years of education: Not on file   Highest education level: Not on file  Occupational History   Not on file  Tobacco Use   Smoking status: Never   Smokeless tobacco: Never  Vaping Use   Vaping status: Never Used  Substance and Sexual Activity   Alcohol use: Yes    Comment: occasional drink with liquor   Drug use: Never   Sexual activity: Not on file  Other Topics Concern   Not on file  Social History Narrative   Not on file   Social Drivers of Health   Financial Resource Strain: Not on file  Food Insecurity: Not on file  Transportation Needs: Not on file  Physical Activity: Not on file  Stress: Not on file  Social Connections: Not on file    MEDICATIONS:  Current Outpatient Medications  Medication Sig Dispense Refill   Zinc 50 MG TABS Take by mouth.     acetaminophen  (TYLENOL ) 500 MG tablet Take 1,000 mg by mouth every 6 (six) hours as needed (for pain).     Biotin  2500 MCG CAPS Take 2,500 mcg by mouth daily.      carvedilol  (COREG ) 6.25 MG tablet Take 6.25 mg by mouth 2 (two) times daily with a meal. Patient is not sure of dose     Cholecalciferol  (VITAMIN D -3) 25 MCG (1000 UT) CAPS Take 1,000 Units by  mouth every other day.     clindamycin  (CLEOCIN ) 2 % vaginal cream Place 1 Applicatorful vaginally daily as needed (irritation).      Desoximetasone  0.05 % GEL Apply 1 application topically daily as needed (ostomy pouch irritation). 5 Tube 0   ezetimibe  (ZETIA ) 10 MG tablet Take 1 tablet (10 mg total) by mouth daily. 90 tablet 1   furosemide  (LASIX ) 40 MG tablet Take 40 mg by mouth daily as needed for fluid or edema.     glucose blood (ONETOUCH VERIO) test strip Use 2 times daily.     losartan  (COZAAR ) 100 MG tablet Take 100 mg by mouth at bedtime.     Multiple Vitamins-Minerals (CENTRUM SILVER 50+WOMEN) TABS Take 1 tablet by mouth daily with breakfast.      ONETOUCH VERIO test strip Use with meter to check blood glucose 2 (two) times daily. 200 each 3   Polyethyl Glycol-Propyl Glycol (SYSTANE) 0.4-0.3 % GEL ophthalmic gel Place 1 application into both eyes at bedtime.     potassium bicarbonate (K-LYTE) 25 MEQ disintegrating tablet Take 25 mEq by mouth daily as needed (ONLY WHEN TAKING FUROSEMIDE ).      tirzepatide  (MOUNJARO ) 7.5 MG/0.5ML Pen Inject 7.5 mg into the skin once a week. 6 mL 3   No current facility-administered medications for this visit.    PHYSICAL EXAM: Vitals:   10/22/23 1309 10/22/23 1310  BP: (!) 162/92 (!) 170/80  Pulse: 72   Resp: 16   SpO2: 98%   Weight: 128 lb 3.2 oz (58.2 kg)   Height: 5' (1.524 m)    Body mass index is 25.04 kg/m.  Wt Readings from Last 3 Encounters:  10/22/23 128 lb 3.2 oz (58.2 kg)  10/05/23 127 lb 6 oz (57.8 kg)  09/27/23 128 lb 6 oz (58.2 kg)    General: Well developed, well nourished female in no apparent distress.  HEENT: AT/[place], no external lesions.  Eyes: Conjunctiva clear and no icterus. Neck: Neck supple  Lungs:  Respirations not labored Neurologic: Alert, oriented, normal speech Extremities / Skin: Dry.  Psychiatric: Does not appear depressed or anxious  Diabetic Foot Exam - Simple   No data filed    LABS Reviewed Lab  Results  Component Value Date   HGBA1C 7.7 (H) 09/27/2023   HGBA1C 7.5 (H) 05/25/2023   HGBA1C 8.4 (H) 01/17/2023   No results found for: FRUCTOSAMINE Lab Results  Component Value Date   CHOL 231 (H) 09/27/2023   HDL 42.60 09/27/2023   LDLCALC 120 (H) 09/27/2023   TRIG 342.0 (H) 09/27/2023   CHOLHDL 5 09/27/2023   No results found for: [facility] Lab Results  Component Value Date   CREATININE 1.60 (H) 10/12/2023   Lab Results  Component Value Date   GFR 29.69 (L) 10/12/2023    ASSESSMENT / PLAN  1. Type 2 diabetes mellitus with other specified complication, without long-term current use of insulin  (HCC)   2. Stage 4 chronic kidney disease (HCC)   3. Type 2 diabetes mellitus with stage 4 chronic kidney disease, without long-term current use of insulin  (HCC)     Diabetes Mellitus type 2, complicated by CKD. - Diabetic status / severity: Fair control.  Lab Results  Component Value Date   HGBA1C 7.7 (H) 09/27/2023    - Hemoglobin A1c goal : <7-7.5%  Discussed about type 2 diabetes mellitus and potential chronic diabetic complications including diabetic retinopathy, neuropathy and nephropathy.  Discussed that hemoglobin A1c cannot be fully reliable in severe CKD.  She has CKD 4.  She has mostly acceptable blood sugar and glucose data as reviewed above.  Hemoglobin A1c was 7.7% 1 month ago, she has mostly acceptable blood sugar on glucometer data, indicating reasonable control of type 2 diabetes mellitus.  - Medications: See below.  I) continue current dose of Mounjaro  7.5 mg weekly.  Increasing dose can be considered in the future if needed.  She feels comfortable on the current dose in terms of her appetite and weight loss.  - Home glucose testing: In the morning fasting and at bedtime.  Discussed about goal of blood sugar fasting should be around low 100 - 150 range and at bedtime after eating  should be below 200.  - Discussed/ Gave Hypoglycemia treatment  plan.  # Consult : not required at this time.   # Annual urine for microalbuminuria/ creatinine ratio, no microalbuminuria currently, continue ACE/ARB losartan .  She has CKD 4, following with nephrology, she wants to monitor urine microalbumin creatinine ratio with her nephrologist.   Last No results found for: [facility]  # Foot check nightly.  # Annual dilated diabetic eye exams.  She reports no diabetic retinopathy.  She has follow-up with ophthalmology in the recent future.  - Diet: Make healthy diabetic food choices - Life style / activity / exercise: Discussed.  2. Blood pressure  -  BP Readings from Last 1 Encounters:  10/22/23 (!) 170/80    - Control is not in target. She reports blood pressure is normal at home.  Advised patient to monitor at home and if remains high asked to discuss with primary care provider. - No change in current plans.  3. Lipid status / Hyperlipidemia - Last  Lab Results  Component Value Date   LDLCALC 120 (H) 09/27/2023   - Continue Zetia .  Not on a statin.  Managed by primary care provider.  She had intolerance with statin in the past.  Diagnoses and all orders for this visit:  Type 2 diabetes mellitus  with other specified complication, without long-term current use of insulin  (HCC) -     ONETOUCH VERIO test strip; Use with meter to check blood glucose 2 (two) times daily.  Stage 4 chronic kidney disease (HCC) -     ONETOUCH VERIO test strip; Use with meter to check blood glucose 2 (two) times daily.  Type 2 diabetes mellitus with stage 4 chronic kidney disease, without long-term current use of insulin  (HCC)    DISPOSITION Follow up in clinic in 3 months suggested.   All questions answered and patient verbalized understanding of the plan.  [place] Froylan Hobby, MD [facility] [address] [place], [address] [place], [place] [address] Phone # [phone]  At least part of this note was generated using voice  recognition software. Inadvertent word errors may have occurred, which were not recognized during the proofreading process.

## 2023-10-29 DIAGNOSIS — I129 Hypertensive chronic kidney disease with stage 1 through stage 4 chronic kidney disease, or unspecified chronic kidney disease: Secondary | ICD-10-CM | POA: Diagnosis not present

## 2023-10-29 DIAGNOSIS — N1832 Chronic kidney disease, stage 3b: Secondary | ICD-10-CM | POA: Diagnosis not present

## 2023-10-29 DIAGNOSIS — E1122 Type 2 diabetes mellitus with diabetic chronic kidney disease: Secondary | ICD-10-CM | POA: Diagnosis not present

## 2023-11-05 ENCOUNTER — Encounter: Payer: Self-pay | Admitting: Radiology

## 2023-11-08 DIAGNOSIS — E119 Type 2 diabetes mellitus without complications: Secondary | ICD-10-CM | POA: Diagnosis not present

## 2023-11-08 LAB — OPHTHALMOLOGY REPORT-SCANNED

## 2023-11-19 DIAGNOSIS — Z932 Ileostomy status: Secondary | ICD-10-CM | POA: Diagnosis not present

## 2023-11-29 ENCOUNTER — Other Ambulatory Visit: Payer: Self-pay | Admitting: Nurse Practitioner

## 2023-11-29 DIAGNOSIS — E782 Mixed hyperlipidemia: Secondary | ICD-10-CM

## 2024-01-10 ENCOUNTER — Ambulatory Visit: Payer: Self-pay | Admitting: Nurse Practitioner

## 2024-01-10 ENCOUNTER — Other Ambulatory Visit

## 2024-01-10 VITALS — BP 132/80 | HR 80 | Temp 97.9°F | Ht 60.0 in | Wt 126.0 lb

## 2024-01-10 DIAGNOSIS — N1832 Chronic kidney disease, stage 3b: Secondary | ICD-10-CM

## 2024-01-10 DIAGNOSIS — E1122 Type 2 diabetes mellitus with diabetic chronic kidney disease: Secondary | ICD-10-CM

## 2024-01-10 DIAGNOSIS — F40240 Claustrophobia: Secondary | ICD-10-CM | POA: Insufficient documentation

## 2024-01-10 DIAGNOSIS — Z7985 Long-term (current) use of injectable non-insulin antidiabetic drugs: Secondary | ICD-10-CM

## 2024-01-10 DIAGNOSIS — R109 Unspecified abdominal pain: Secondary | ICD-10-CM | POA: Insufficient documentation

## 2024-01-10 DIAGNOSIS — E119 Type 2 diabetes mellitus without complications: Secondary | ICD-10-CM

## 2024-01-10 LAB — CBC WITH DIFFERENTIAL/PLATELET
Basophils Absolute: 0.1 K/uL (ref 0.0–0.1)
Basophils Relative: 1.1 % (ref 0.0–3.0)
Eosinophils Absolute: 0.2 K/uL (ref 0.0–0.7)
Eosinophils Relative: 1.8 % (ref 0.0–5.0)
HCT: 36.6 % (ref 36.0–46.0)
Hemoglobin: 12.4 g/dL (ref 12.0–15.0)
Lymphocytes Relative: 17.6 % (ref 12.0–46.0)
Lymphs Abs: 1.6 K/uL (ref 0.7–4.0)
MCHC: 33.9 g/dL (ref 30.0–36.0)
MCV: 86 fl (ref 78.0–100.0)
Monocytes Absolute: 0.6 K/uL (ref 0.1–1.0)
Monocytes Relative: 7.3 % (ref 3.0–12.0)
Neutro Abs: 6.4 K/uL (ref 1.4–7.7)
Neutrophils Relative %: 72.2 % (ref 43.0–77.0)
Platelets: 199 K/uL (ref 150.0–400.0)
RBC: 4.26 Mil/uL (ref 3.87–5.11)
RDW: 13.7 % (ref 11.5–15.5)
WBC: 8.8 K/uL (ref 4.0–10.5)

## 2024-01-10 LAB — LIPASE: Lipase: 185 U/L — ABNORMAL HIGH (ref 11.0–59.0)

## 2024-01-10 LAB — COMPREHENSIVE METABOLIC PANEL WITH GFR
ALT: 10 U/L (ref 3–35)
AST: 17 U/L (ref 5–37)
Albumin: 4 g/dL (ref 3.5–5.2)
Alkaline Phosphatase: 52 U/L (ref 39–117)
BUN: 35 mg/dL — ABNORMAL HIGH (ref 6–23)
CO2: 25 meq/L (ref 19–32)
Calcium: 9.3 mg/dL (ref 8.4–10.5)
Chloride: 102 meq/L (ref 96–112)
Creatinine, Ser: 1.72 mg/dL — ABNORMAL HIGH (ref 0.40–1.20)
GFR: 27.17 mL/min — ABNORMAL LOW
Glucose, Bld: 114 mg/dL — ABNORMAL HIGH (ref 70–99)
Potassium: 3.9 meq/L (ref 3.5–5.1)
Sodium: 137 meq/L (ref 135–145)
Total Bilirubin: 0.4 mg/dL (ref 0.2–1.2)
Total Protein: 7 g/dL (ref 6.0–8.3)

## 2024-01-10 LAB — AMYLASE: Amylase: 55 U/L (ref 27–131)

## 2024-01-10 MED ORDER — DIAZEPAM 5 MG PO TABS: 5.0000 mg | ORAL_TABLET | Freq: Once | ORAL | 0 refills | Status: AC | PRN

## 2024-01-10 MED ORDER — DIAZEPAM 5 MG PO TABS: 5.0000 mg | ORAL_TABLET | Freq: Two times a day (BID) | ORAL | 1 refills | Status: DC | PRN

## 2024-01-10 NOTE — Progress Notes (Signed)
 "  Established Patient Office Visit  Subjective   Patient ID: Angelica French, female    DOB: 1940/03/18  Age: 84 y.o. MRN: [id number]  Chief Complaint  Patient presents with   Follow-up    3 month f/u diabetes and stomach aches    Discussed the use of AI scribe software for clinical note transcription with the patient, who gave verbal consent to proceed.  History of Present Illness Angelica French is an 84 year old female here for follow-up  T2DM - Type 2 Diabetes mellitus with associated CKD stage 4 - Following with endocrinology and nephrology - Last A1C 7.7 - Currently treated with mounjaro  7.5mg /week as monotherapy - Concerned about possible diabetic neuropathy, has had some intermittent burning in feet first thing in the morning. It improves as the day progresses.    Chronic abdominal pain - Generalized, sometimes severe abdominal pain for approximately ten years - Frequent trigger is eating followed by physical activity such as shopping/walking. 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  Constitutional:      General: She is not in acute distress.    Appearance: Normal appearance.  HENT:     Head: Normocephalic and atraumatic.  Cardiovascular:     Rate and Rhythm: Normal rate and regular rhythm.     Pulses: Normal pulses.     Heart sounds: Normal heart sounds.  Pulmonary:     Effort: Pulmonary effort is normal.     Breath sounds: Normal breath sounds.  Abdominal:     General: Abdomen is flat. Bowel sounds are increased.     Palpations: Abdomen is soft.     Tenderness: There is no abdominal tenderness.  Skin:    General: Skin is warm and dry.  Neurological:     General: No focal deficit present.     Mental Status: She is alert and oriented to person, place, and time.  Psychiatric:        Mood and Affect: Mood normal.        Behavior: Behavior normal.        Judgment: Judgment normal.      Last hemoglobin A1c Lab Results  Component Value Date   HGBA1C 7.7 (H) 09/27/2023      The ASCVD Risk score (Arnett DK, et al., 2019) failed to calculate for the following reasons:  The 2019 ASCVD risk score is only valid for ages 89 to 29   * - Cholesterol units were assumed    Assessment & Plan:   Problem List Items Addressed This Visit       Endocrine   Type 2 diabetes mellitus with diabetic chronic kidney disease (HCC) - Primary (Chronic)   Diabetes mellitus type 2 without retinopathy (HCC)     Other   Claustrophobia   Relevant Medications   diazepam  (VALIUM ) 5 MG tablet   Abdominal pain   Relevant Orders   Amylase (Completed)   Lipase (Completed)   MR ABDOMEN WO CONTRAST   Comprehensive metabolic panel with GFR (Completed)   HepB+HepC+HIV Panel   CBC with Differential/Platelet (Completed)   Assessment and Plan Assessment & Plan Chronic abdominal pain with history of ileostomy and risk for intraabdominal adhesions Chronic abdominal pain likely due to  adhesions, possible low-grade pancreatitis, and shorter colon post-ileostomy. Previous CT showed pancreatic cyst. Pain improved after stopping Victoza , suggesting pancreatitis. - Ordered MRI of abdomen without contrast. - Checked lipase and amylase levels. - Consider Gas-X for bloating. - Discuss potential discontinuation of Mounjaro  if pancreatic inflammation is confirmed.  Type 2 diabetes mellitus with stage 4 chronic kidney disease Blood sugar well-controlled with occasional postprandial elevation. A1c acceptable. Nephrologist satisfied with kidney function. No recent eye damage. - Continue current diabetes management plan. - Discuss blood sugar control with endocrinologist. - Continue follow-up with nephrologist every four months.  Diabetic neuropathy Burning sensation in feet suggestive of diabetic neuropathy, likely related to long-standing diabetes. Blood sugar control crucial to prevent progression. - Discuss potential adjustments in blood sugar control with endocrinologist. - Consider nerve pain management options if symptoms persist.    Return in about 6 months (around 07/09/2024) for F/U with Shabree Tebbetts.    Angelica FORBES Pereyra, NP  "

## 2024-01-11 ENCOUNTER — Other Ambulatory Visit: Payer: Self-pay | Admitting: Nurse Practitioner

## 2024-01-11 ENCOUNTER — Ambulatory Visit: Payer: Self-pay | Admitting: Nurse Practitioner

## 2024-01-11 ENCOUNTER — Encounter: Payer: Self-pay | Admitting: Nurse Practitioner

## 2024-01-11 DIAGNOSIS — K439 Ventral hernia without obstruction or gangrene: Secondary | ICD-10-CM

## 2024-01-11 DIAGNOSIS — N1832 Chronic kidney disease, stage 3b: Secondary | ICD-10-CM

## 2024-01-11 DIAGNOSIS — E119 Type 2 diabetes mellitus without complications: Secondary | ICD-10-CM

## 2024-01-11 MED ORDER — TRESIBA FLEXTOUCH 100 UNIT/ML ~~LOC~~ SOPN: 10.0000 [IU] | PEN_INJECTOR | Freq: Every day | SUBCUTANEOUS | 0 refills | Status: DC

## 2024-01-11 NOTE — Progress Notes (Signed)
 This encounter was created in error - please disregard.

## 2024-01-12 LAB — HEPB+HEPC+HIV PANEL
HIV Screen 4th Generation wRfx: NONREACTIVE
Hep B C IgM: NEGATIVE
Hep B Core Total Ab: NEGATIVE
Hep B E Ab: NONREACTIVE
Hep B E Ag: NEGATIVE
Hep B Surface Ab, Qual: NONREACTIVE
Hep C Virus Ab: NONREACTIVE
Hepatitis B Surface Ag: NEGATIVE

## 2024-01-23 ENCOUNTER — Encounter: Payer: Self-pay | Admitting: Endocrinology

## 2024-01-23 ENCOUNTER — Ambulatory Visit: Payer: Self-pay | Admitting: Endocrinology

## 2024-01-23 ENCOUNTER — Ambulatory Visit: Admitting: Endocrinology

## 2024-01-23 VITALS — BP 122/68 | HR 69 | Resp 16 | Ht 60.0 in | Wt 124.4 lb

## 2024-01-23 DIAGNOSIS — N1832 Chronic kidney disease, stage 3b: Secondary | ICD-10-CM

## 2024-01-23 DIAGNOSIS — Z794 Long term (current) use of insulin: Secondary | ICD-10-CM | POA: Diagnosis not present

## 2024-01-23 DIAGNOSIS — E1169 Type 2 diabetes mellitus with other specified complication: Secondary | ICD-10-CM

## 2024-01-23 DIAGNOSIS — E119 Type 2 diabetes mellitus without complications: Secondary | ICD-10-CM

## 2024-01-23 DIAGNOSIS — E1122 Type 2 diabetes mellitus with diabetic chronic kidney disease: Secondary | ICD-10-CM

## 2024-01-23 LAB — POCT GLYCOSYLATED HEMOGLOBIN (HGB A1C): Hemoglobin A1C: 6.3 % — AB (ref 4.0–5.6)

## 2024-01-23 MED ORDER — INSULIN PEN NEEDLE 31G X 4 MM MISC: 1.0000 | Freq: Every day | 4 refills | Status: AC

## 2024-01-23 MED ORDER — TRESIBA FLEXTOUCH 100 UNIT/ML ~~LOC~~ SOPN: 6.0000 [IU] | PEN_INJECTOR | Freq: Every day | SUBCUTANEOUS | 4 refills | Status: AC

## 2024-01-23 NOTE — Patient Instructions (Signed)
 Take Tresiba  6 units daily.

## 2024-01-23 NOTE — Progress Notes (Signed)
 "  Outpatient Endocrinology Note Edgar Corrigan, MD   Patient's Name: Angelica French    DOB: [birth year]    MRN: [id number]                                                    REASON OF VISIT: Follow-up for type 2 diabetes mellitus  REFERRING PROVIDER: Elnor Lauraine BRAVO, NP  PCP: Elnor Lauraine BRAVO, NP  HISTORY OF PRESENT ILLNESS:   Angelica French is a 84 y.o. old female with past medical history listed below, is here for follow-up for type 2 diabetes mellitus.   Pertinent Diabetes History:  Patient was referred to endocrinology for evaluation and management of type 2 diabetes mellitus.  Initial consult on October 23, 2023.  Patient was diagnosed with type 2 diabetes mellitus around 2010 timeframe. Patient has uncontrolled type 2 diabetes mellitus, with hemoglobin A1c 7.5 to 8.4% range.  Patient used to be on Victoza  and metformin in the past.  She reports she had reasonable control of diabetes mellitus on Victoza , she used to follow-up with endocrinology at [facility] in the past.  Metformin was stopped due to GI intolerance.  Later Victoza  was stopped due to GI intolerance as well / stomach pain.  Then she was treated with basal insulin /Tresiba , she reports she had variable blood sugar with weight gain.  GLP-1 receptor agonist Mounjaro  was started and Tresiba  was gradually tapered and was stopped in July 2025.  She had been on Tresiba  up to 20 units daily.  Mounjaro  was started in June 2025.  History of DKA or diabetes related hospitalizations: none  Previous diabetes education: Yes   No personal history of pancreatitis and / or family history of medullary thyroid  carcinoma or MEN 2B syndrome.   Chronic Diabetes Complications : Retinopathy: no. Last ophthalmology exam was done on annually, following with ophthalmology regularly.  Reportedly. 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  Peripheral neuropathy: no Coronary artery disease: no Stroke: no  Relevant comorbidities and cardiovascular risk factors: Obesity: no Body mass index is 24.3 kg/m.  Hypertension: Yes  Hyperlipidemia : Yes, not on statin.  On Zetia .  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  Had taken atorvastatin  in the past.  Current / Home Diabetic regimen includes:  Tresiba  10 units daily.  Prior diabetic medications: Victoza  in the past stopped due to GI intolerance.  Metformin was stopped due to renal insufficiency and diarrhea.  Was on Tresiba  transition to GLP-1 receptor agonist/Mounjaro . Mounjaro  7.5 mg, stopped in the second week of January due to elevated lipase.  Glycemic data:    Angelica French's glucose meter is computer downloaded. Raw data and trends analyzed.  One Touch Verio reflect glucometer, average blood sugar in last December 23 2 January 23, 2024 was 131.  Checking blood sugar usually 2 times a day.    She has acceptable blood sugar.  After being on insulin  she had 1 episode of hypoglycemia this morning blood sugar 67.  Other blood sugar 93, 99, 100, 164, 141.   Hypoglycemia: Patient has one hypoglycemic episodes. Patient has hypoglycemia awareness.  Factors modifying glucose control: 1.  Diabetic diet assessment: Usually 2 meals a day lunch and supper.  Bedtime snack maybe once a week.  2.  Staying active or exercising:   3.  Medication compliance: compliant all of the time.  # Patient has permanent ileostomy.   Interval history  Patient continues to have a stomach pain recently worsening, was evaluated by primary care provider earlier this month, lipase was elevated 185.  After not being on Mounjaro  she has some improvement on abdomen pain.  She had similar symptoms when taking Victoza , got better after switching to Mounjaro .  Mounjaro  was stopped and started on Tresiba  10 units daily.  Glucometer data as reviewed above.  Mostly acceptable blood sugar  in the last 30 days.  After she being on insulin  she had 1 episode of low blood sugar 67 this morning and other times lower normal blood sugar.  No concerning hyperglycemia.  She has plan for MRI abdomen tomorrow.  No other complaints.  REVIEW OF SYSTEMS  As per history of present illness.   PAST MEDICAL HISTORY: Past Medical History:  Diagnosis Date   Arthritis    Bleeding ulcer    CHF (congestive heart failure) (HCC)    Chronic kidney disease    Renal artery stenosis - right. Left kindey ok- sees Dr Carlette- nepjrologist   Diabetes mellitus without complication (HCC)    type II   Fundic gland polyps of stomach, benign    Gastric ulcer 1994   bleeding   History of blood transfusion    1970- illeostomy 2004-   Hypertension    UC (ulcerative colitis) (HCC)    VRE (vancomycin resistant enterococcus) culture positive 2004    PAST SURGICAL HISTORY: Past Surgical History:  Procedure Laterality Date   ABDOMINAL HYSTERECTOMY     ABSCESS DRAINAGE     abdominal   BACK SURGERY Left 1997   Left 5 DISECTOMY   BLADDER SUSPENSION  1998   EYE SURGERY Bilateral    cataract   HERNIA MESH REMOVAL  2004   relocation of ileostomy to opposite side of abdomen   PERMANENT ILEOSTOMY  1970   stoma hernia  2004   attempted- resulting in perforated bowel and infection   stoma; Hernia repair     2003- attempted   TOTAL KNEE ARTHROPLASTY Left 04/13/2017   Procedure: LEFT TOTAL KNEE ARTHROPLASTY AND RIGHT KNEE INTRA-ARTICULAR STEROID INJECTION;  Surgeon: Lucilla Lynwood BRAVO, MD;  Location: [facility] OR;  Service: Orthopedics;  Laterality: Left;   TOTAL KNEE ARTHROPLASTY Right 06/25/2017   TOTAL KNEE ARTHROPLASTY Right 06/25/2017   Procedure: RIGHT TOTAL KNEE ARTHROPLASTY;  Surgeon: Lucilla Lynwood BRAVO, MD;  Location: [facility] OR;  Service: Orthopedics;  Laterality: Right;    ALLERGIES: Allergies  Allergen Reactions   Nsaids Nausea Only and Other (See Comments)    GI pain after prolonged use = ULCERS     Tape  Hives and Itching   Aspirin  Other (See Comments)    No buffered aspirin  = GI pain that develops into ulcers   Atorvastatin      Myopathy    Coreg  [Carvedilol ] Other (See Comments)    Makes my back hurt   Pravastatin      Myopathy     FAMILY HISTORY:  Family History  Problem Relation Age of Onset   Heart disease Father     SOCIAL HISTORY: Social History   Socioeconomic History   Marital status: Widowed    Spouse name: Not on file   Number of children: Not on file   Years of education: Not on file   Highest education level: Not on file  Occupational History  Not on file  Tobacco Use   Smoking status: Never   Smokeless tobacco: Never  Vaping Use   Vaping status: Never Used  Substance and Sexual Activity   Alcohol use: Yes    Comment: occasional drink with liquor   Drug use: Never   Sexual activity: Not on file  Other Topics Concern   Not on file  Social History Narrative   Not on file   Social Drivers of Health   Tobacco Use: Low Risk (01/23/2024)   Patient History    Smoking Tobacco Use: Never    Smokeless Tobacco Use: Never    Passive Exposure: Not on file  Financial Resource Strain: Not on file  Food Insecurity: Not on file  Transportation Needs: Not on file  Physical Activity: Not on file  Stress: Not on file  Social Connections: Not on file  Depression (PHQ2-9): Low Risk (02/23/2023)   Depression (PHQ2-9)    PHQ-2 Score: 0  Alcohol Screen: Not on file  Housing: Not on file  Utilities: Not on file  Health Literacy: Not on file    MEDICATIONS:  Current Outpatient Medications  Medication Sig Dispense Refill   acetaminophen  (TYLENOL ) 500 MG tablet Take 1,000 mg by mouth every 6 (six) hours as needed (for pain).     Biotin  2500 MCG CAPS Take 2,500 mcg by mouth daily.      carvedilol  (COREG ) 6.25 MG tablet Take 6.25 mg by mouth 2 (two) times daily with a meal. Patient is not sure of dose     Cholecalciferol  (VITAMIN D -3) 25 MCG (1000 UT) CAPS Take  1,000 Units by mouth every other day.     clindamycin  (CLEOCIN ) 2 % vaginal cream Place 1 Applicatorful vaginally daily as needed (irritation).      Desoximetasone  0.05 % GEL Apply 1 application topically daily as needed (ostomy pouch irritation). 5 Tube 0   diazepam  (VALIUM ) 5 MG tablet Take 1 tablet (5 mg total) by mouth once as needed for up to 1 dose for anxiety. 1 tablet 0   ezetimibe  (ZETIA ) 10 MG tablet TAKE 1 TABLET BY MOUTH EVERY DAY 90 tablet 1   furosemide  (LASIX ) 40 MG tablet Take 40 mg by mouth daily as needed for fluid or edema.     glucose blood (ONETOUCH VERIO) test strip Use 2 times daily.     Insulin  Pen Needle 31G X 4 MM MISC 1 each by Does not apply route daily. 100 each 4   losartan  (COZAAR ) 100 MG tablet Take 100 mg by mouth at bedtime.     Multiple Vitamins-Minerals (CENTRUM SILVER 50+WOMEN) TABS Take 1 tablet by mouth daily with breakfast.      ONETOUCH VERIO test strip Use with meter to check blood glucose 2 (two) times daily. 200 each 3   Polyethyl Glycol-Propyl Glycol (SYSTANE) 0.4-0.3 % GEL ophthalmic gel Place 1 application into both eyes at bedtime.     potassium bicarbonate (K-LYTE) 25 MEQ disintegrating tablet Take 25 mEq by mouth daily as needed (ONLY WHEN TAKING FUROSEMIDE ).      Zinc 50 MG TABS Take by mouth.     insulin  degludec (TRESIBA  FLEXTOUCH) 100 UNIT/ML FlexTouch Pen Inject 6 Units into the skin daily. 6 mL 4   No current facility-administered medications for this visit.    PHYSICAL EXAM: Vitals:   01/23/24 1309  BP: 122/68  Pulse: 69  Resp: 16  SpO2: 99%  Weight: 124 lb 6.4 oz (56.4 kg)  Height: 5' (1.524 m)  Body mass index is 24.3 kg/m.  Wt Readings from Last 3 Encounters:  01/23/24 124 lb 6.4 oz (56.4 kg)  01/10/24 126 lb (57.2 kg)  10/22/23 128 lb 3.2 oz (58.2 kg)    General: Well developed, well nourished female in no apparent distress.  HEENT: AT/[place], no external lesions.  Eyes: Conjunctiva clear and no icterus. Neck: Neck  supple  Lungs: Respirations not labored Neurologic: Alert, oriented, normal speech Extremities / Skin: Dry.  Psychiatric: Does not appear depressed or anxious  Diabetic Foot Exam - Simple   No data filed    LABS Reviewed Lab Results  Component Value Date   HGBA1C 6.3 (A) 01/23/2024   HGBA1C 7.7 (H) 09/27/2023   HGBA1C 7.5 (H) 05/25/2023   No results found for: FRUCTOSAMINE Lab Results  Component Value Date   CHOL 231 (H) 09/27/2023   HDL 42.60 09/27/2023   LDLCALC 120 (H) 09/27/2023   TRIG 342.0 (H) 09/27/2023   CHOLHDL 5 09/27/2023   No results found for: [facility] Lab Results  Component Value Date   CREATININE 1.72 (H) 01/10/2024   Lab Results  Component Value Date   GFR 27.17 (L) 01/10/2024    ASSESSMENT / PLAN  1. Type 2 diabetes mellitus with other specified complication, with long-term current use of insulin  (HCC)   2. Type 2 diabetes mellitus with stage 3b chronic kidney disease, with long-term current use of insulin  (HCC)   3. Diabetes mellitus type 2 without retinopathy (HCC)     Diabetes Mellitus type 2, complicated by CKD. - Diabetic status / severity: Fair control.  Lab Results  Component Value Date   HGBA1C 6.3 (A) 01/23/2024    - Hemoglobin A1c goal : <7-7.5%  Patient has mostly acceptable blood sugar.  Due to continued abdominal pain beginning of this month patient had lipase checked was elevated at 185.  Mounjaro  was stopped.  Agree with stopping Mounjaro .  Elevation of lipase can also be related to use of GLP-1 receptor agonist not necessarily active acute pancreatitis.  Patient is planned to have MRI abdomen tomorrow.  Consider rechecking lipase after few months.  Will not restart Mounjaro  or GLP-1 receptor agonist at this time.  Glucometer data with mild hypoglycemia this morning.  She is currently taking Tresiba  10 units daily starting from this Sunday.   - Medications: See below.  I) decrease Mounjaro  from 10 to 6 units  daily. II) consider Tradjenta in the future.  - Home glucose testing: In the morning fasting and at bedtime.  Discussed about goal of blood sugar fasting should be around low 100 - 150 range and at bedtime after eating  should be below 200.  - Discussed/ Gave Hypoglycemia treatment plan.  # Consult : not required at this time.   # Annual urine for microalbuminuria/ creatinine ratio, no microalbuminuria currently, continue ACE/ARB losartan .  She has CKD 4, following with nephrology, she wants to monitor urine microalbumin creatinine ratio with her nephrologist.   Last No results found for: [facility]  # Foot check nightly.  # Annual dilated diabetic eye exams.  She reports no diabetic retinopathy.  She has follow-up with ophthalmology in the recent future.  - Diet: Make healthy diabetic food choices - Life style / activity / exercise: Discussed.  2. Blood pressure  -  BP Readings from Last 1 Encounters:  01/23/24 122/68    - Control is in target.  - No change in current plans.  3. Lipid status / Hyperlipidemia - Last  Lab  Results  Component Value Date   LDLCALC 120 (H) 09/27/2023   - Continue Zetia .  Not on a statin.  Managed by primary care provider.  She had intolerance with statin in the past.  Diagnoses and all orders for this visit:  Type 2 diabetes mellitus with other specified complication, with long-term current use of insulin  (HCC) -     POCT glycosylated hemoglobin (Hb A1C) -     Insulin  Pen Needle 31G X 4 MM MISC; 1 each by Does not apply route daily.  Type 2 diabetes mellitus with stage 3b chronic kidney disease, with long-term current use of insulin  (HCC) -     insulin  degludec (TRESIBA  FLEXTOUCH) 100 UNIT/ML FlexTouch Pen; Inject 6 Units into the skin daily.  Diabetes mellitus type 2 without retinopathy (HCC) -     insulin  degludec (TRESIBA  FLEXTOUCH) 100 UNIT/ML FlexTouch Pen; Inject 6 Units into the skin daily.    DISPOSITION Follow up in clinic in  2 months suggested.   All questions answered and patient verbalized understanding of the plan.  Harpreet Pompey, MD [facility], [place] [facility] [address] [place], [address] [place], [place] [address] Phone # [phone]  At least part of this note was generated using voice recognition software. Inadvertent word errors may have occurred, which were not recognized during the proofreading process. "

## 2024-01-24 ENCOUNTER — Ambulatory Visit
Admission: RE | Admit: 2024-01-24 | Discharge: 2024-01-24 | Disposition: A | Discharge status: Home / Self Care | Source: Ambulatory Visit | Attending: Nurse Practitioner | Admitting: Nurse Practitioner

## 2024-01-24 DIAGNOSIS — R109 Unspecified abdominal pain: Secondary | ICD-10-CM

## 2024-03-03 ENCOUNTER — Ambulatory Visit: Admitting: Endocrinology

## 2024-03-12 ENCOUNTER — Ambulatory Visit: Admitting: Endocrinology

## 2024-07-18 ENCOUNTER — Ambulatory Visit: Admitting: Nurse Practitioner
